# Patient Record
Sex: Male | Born: 1935
Health system: Southern US, Community
[De-identification: ages and names within clinical notes are randomized; demographics above are authoritative.]

## PROBLEM LIST (undated history)

## (undated) ENCOUNTER — Emergency Department (HOSPITAL_BASED_OUTPATIENT_CLINIC_OR_DEPARTMENT_OTHER): Admission: EM | Payer: Medicare PPO | Source: Home / Self Care

## (undated) DIAGNOSIS — F0390 Unspecified dementia without behavioral disturbance: Secondary | ICD-10-CM

## (undated) DIAGNOSIS — J189 Pneumonia, unspecified organism: Secondary | ICD-10-CM

## (undated) DIAGNOSIS — E785 Hyperlipidemia, unspecified: Secondary | ICD-10-CM

## (undated) DIAGNOSIS — G4730 Sleep apnea, unspecified: Secondary | ICD-10-CM

## (undated) DIAGNOSIS — E669 Obesity, unspecified: Secondary | ICD-10-CM

## (undated) DIAGNOSIS — I639 Cerebral infarction, unspecified: Secondary | ICD-10-CM

## (undated) DIAGNOSIS — F419 Anxiety disorder, unspecified: Secondary | ICD-10-CM

## (undated) HISTORY — PX: TRANSURETHRAL RESECTION OF PROSTATE: SHX73

## (undated) HISTORY — PX: OTHER SURGICAL HISTORY: SHX169

## (undated) HISTORY — DX: Pneumonia, unspecified organism: J18.9

## (undated) HISTORY — DX: Unspecified dementia, unspecified severity, without behavioral disturbance, psychotic disturbance, mood disturbance, and anxiety: F03.90

## (undated) HISTORY — DX: Cerebral infarction, unspecified: I63.9

## (undated) HISTORY — DX: Hyperlipidemia, unspecified: E78.5

## (undated) HISTORY — DX: Anxiety disorder, unspecified: F41.9

## (undated) HISTORY — DX: Obesity, unspecified: E66.9

## (undated) HISTORY — PX: HERNIA REPAIR: SHX51

## (undated) HISTORY — PX: REPLACEMENT TOTAL KNEE: SUR1224

---

## 1998-01-16 ENCOUNTER — Encounter: Admission: RE | Admit: 1998-01-16 | Discharge: 1998-04-16 | Payer: Self-pay | Admitting: Neurosurgery

## 1998-11-02 ENCOUNTER — Encounter: Admission: RE | Admit: 1998-11-02 | Discharge: 1999-01-31 | Payer: Self-pay | Admitting: *Deleted

## 1999-01-08 ENCOUNTER — Encounter: Payer: Self-pay | Admitting: Orthopedic Surgery

## 1999-01-15 ENCOUNTER — Inpatient Hospital Stay (HOSPITAL_COMMUNITY): Admission: RE | Admit: 1999-01-15 | Discharge: 1999-01-19 | Payer: Self-pay | Admitting: Orthopedic Surgery

## 1999-01-15 ENCOUNTER — Encounter: Payer: Self-pay | Admitting: Orthopedic Surgery

## 1999-01-21 ENCOUNTER — Emergency Department (HOSPITAL_COMMUNITY): Admission: EM | Admit: 1999-01-21 | Discharge: 1999-01-21 | Payer: Self-pay | Admitting: Emergency Medicine

## 2002-12-16 ENCOUNTER — Ambulatory Visit (HOSPITAL_COMMUNITY): Admission: RE | Admit: 2002-12-16 | Discharge: 2002-12-16 | Payer: Self-pay | Admitting: *Deleted

## 2004-11-18 ENCOUNTER — Ambulatory Visit: Payer: Self-pay | Admitting: Internal Medicine

## 2004-11-18 ENCOUNTER — Ambulatory Visit (HOSPITAL_BASED_OUTPATIENT_CLINIC_OR_DEPARTMENT_OTHER): Admission: RE | Admit: 2004-11-18 | Discharge: 2004-11-18 | Payer: Self-pay | Admitting: Family Medicine

## 2004-11-18 ENCOUNTER — Encounter: Payer: Self-pay | Admitting: Pulmonary Disease

## 2006-01-12 ENCOUNTER — Ambulatory Visit: Payer: Self-pay | Admitting: Gastroenterology

## 2006-01-22 ENCOUNTER — Ambulatory Visit: Payer: Self-pay | Admitting: Gastroenterology

## 2006-01-30 ENCOUNTER — Ambulatory Visit: Payer: Self-pay | Admitting: Gastroenterology

## 2006-07-17 ENCOUNTER — Ambulatory Visit (HOSPITAL_COMMUNITY): Admission: RE | Admit: 2006-07-17 | Discharge: 2006-07-17 | Payer: Self-pay | Admitting: Cardiology

## 2006-07-27 ENCOUNTER — Ambulatory Visit: Payer: Self-pay | Admitting: Pulmonary Disease

## 2006-08-24 ENCOUNTER — Ambulatory Visit: Payer: Self-pay | Admitting: Pulmonary Disease

## 2007-02-19 ENCOUNTER — Ambulatory Visit: Payer: Self-pay | Admitting: Pulmonary Disease

## 2008-07-19 DIAGNOSIS — G4733 Obstructive sleep apnea (adult) (pediatric): Secondary | ICD-10-CM

## 2008-07-19 DIAGNOSIS — E785 Hyperlipidemia, unspecified: Secondary | ICD-10-CM

## 2008-07-19 DIAGNOSIS — E119 Type 2 diabetes mellitus without complications: Secondary | ICD-10-CM

## 2008-07-19 HISTORY — DX: Obstructive sleep apnea (adult) (pediatric): G47.33

## 2008-07-20 ENCOUNTER — Ambulatory Visit: Payer: Self-pay | Admitting: Pulmonary Disease

## 2008-09-24 ENCOUNTER — Emergency Department (HOSPITAL_COMMUNITY): Admission: EM | Admit: 2008-09-24 | Discharge: 2008-09-24 | Payer: Self-pay | Admitting: Emergency Medicine

## 2008-09-29 ENCOUNTER — Encounter: Payer: Self-pay | Admitting: Internal Medicine

## 2009-06-20 ENCOUNTER — Encounter: Admission: RE | Admit: 2009-06-20 | Discharge: 2009-06-20 | Payer: Self-pay | Admitting: Family Medicine

## 2010-03-13 ENCOUNTER — Emergency Department (HOSPITAL_COMMUNITY): Admission: EM | Admit: 2010-03-13 | Discharge: 2010-03-13 | Payer: Self-pay | Admitting: Emergency Medicine

## 2010-12-30 LAB — CBC
HCT: 34.2 % — ABNORMAL LOW (ref 39.0–52.0)
Hemoglobin: 11.3 g/dL — ABNORMAL LOW (ref 13.0–17.0)
MCHC: 33.2 g/dL (ref 30.0–36.0)
MCV: 97.2 fL (ref 78.0–100.0)
Platelets: 199 10*3/uL (ref 150–400)
RBC: 3.51 MIL/uL — ABNORMAL LOW (ref 4.22–5.81)
RDW: 14.4 % (ref 11.5–15.5)
WBC: 7 10*3/uL (ref 4.0–10.5)

## 2010-12-30 LAB — DIFFERENTIAL
Lymphocytes Relative: 25 % (ref 12–46)
Lymphs Abs: 1.8 10*3/uL (ref 0.7–4.0)
Neutrophils Relative %: 59 % (ref 43–77)

## 2010-12-30 LAB — POCT I-STAT, CHEM 8
BUN: 21 mg/dL (ref 6–23)
Creatinine, Ser: 1.3 mg/dL (ref 0.4–1.5)
Hemoglobin: 12.2 g/dL — ABNORMAL LOW (ref 13.0–17.0)
Potassium: 4.3 mEq/L (ref 3.5–5.1)
Sodium: 137 mEq/L (ref 135–145)

## 2011-01-31 NOTE — Procedures (Signed)
NAME:  [name] [name], [name] [name] NO.:  [id number]   MEDICAL RECORD NO.:  [id number]          PATIENT TYPE:  OUT   LOCATION:  SLEEP CENTER                 FACILITY:  [facility]   PHYSICIAN:  Clinton D. Maple Hudson, M.D. DATE OF BIRTH:  1929/08/07   DATE OF STUDY:  11/18/2004                              NOCTURNAL POLYSOMNOGRAM   DATE OF STUDY:  November 18, 2004   REFERRING PHYSICIAN:  Dr. [name]   INDICATION FOR STUDY:  Hypersomnia with sleep apnea.  Epworth Sleepiness  Score 11/24, BMI 29, weight 198 pounds.   SLEEP ARCHITECTURE:  Total sleep time 313 minutes with sleep efficiency 70%.  Stage I was 17%, stage II 68%, stages III and IV 3%, REM was 13% of total  sleep time.  Sleep latency 6 minutes, REM latency 163 minutes, awake after  sleep onset 126 minutes, arousal index 16.  No medications were taken.   RESPIRATORY DATA:  Split-study protocol.  Respiratory disturbance index  (RDI) 69.1 obstructive events per hour before CPAP.  This indicates severe  obstructive sleep apnea/hypopnea syndrome.  There were 74 obstructive apneas  and 109 hypopneas before CPAP.  Events were not positional.  REM RDI 6 per  hour.  CPAP was titrated to 14 CWP, RDI 4.2 per hour.  A large SNAPP by  [employer] was used with a heated humidifier.  Technician added a  chin strap.   OXYGEN DATA:  Loud snoring with oxygen desaturation to a nadir of 81% before  CPAP.  After CPAP control oxygen saturation held 94-98% on room air.   CARDIAC DATA:  Sinus rhythm with PACs and PVCs including ventricular  bigeminy.   MOVEMENT/PARASOMNIA:  A total of 109 limb jerks were recorded of which 9  were associated with arousal or awakening for a periodic limb movement with  arousal index of 1.7 per hour which is probably not significant.   IMPRESSION/RECOMMENDATION:  1.  Severe obstructive sleep apnea/hypopnea syndrome, respiratory      disturbance index 69.1 per hour with oxygen desaturation to 81%.  2.   Successful continuous positive airway pressure titration to 14 CWP using      a large SNAPP by [employer] with a heated humidifier.  The      technician added a chin strap to control oral venting which may not be      needed at home.  3.  Frequent premature atrial contractions and premature ventricular      contractions.      CDY/MEDQ  D:  11/24/2004 08:48:35  T:  11/24/2004 21:13:26  Job:  [id number]

## 2011-01-31 NOTE — Assessment & Plan Note (Signed)
[facility]                               PULMONARY OFFICE NOTE   NAME:WEBSTERBorna, Wessinger                MRN:          [id number]  DATE:07/27/2006                            DOB:          Dec 02, 1928    HISTORY OF PRESENT ILLNESS:  The patient is a 77 year old male who I have  been asked to see for obstructive sleep apnea. The patient was first  diagnosed with severe obstructive sleep apnea in March of 2006. At that  time, he had a respiratory disturbance index of 69 events per hour with O2  desaturation as low as 81%. He was titrated to a final pressure of 14 cm but  did have trouble with oral venting. The patient states that he wore CPAP for  a short time but has not worn for several months. He states that he is  unable to handle having something on his face, and he also feels that the  CPAP aggravates his sinus congestion. The patient is using humidity but is  not using his heater that would probably help his sinus symptoms  significant. He does use nasal pillows. His current pressure is now set on  10, since he could handle the higher pressure initially. The patient  typically goes to bed at 10:00 and gets up at 7:30 to start his day. He is  only rested some of the mornings. The wife states he has very loud snoring  and a lot of pauses in his breathing during sleep. It has gotten to the  point where it greatly disturbs her sleep. During the day, the patient does  have severe sleepiness with periods of inactivity. He will fall asleep every  time he sits down according to the wife and has even fallen asleep in  church. He is unable to read or watch TV without sleeping. He has no  difficulties driving.   PAST MEDICAL HISTORY:  1. Significant for diabetes.  2. Dyslipidemia.  3. History of heart problems that the patient is unable to describe.  4. History of sleep apnea as stated above.  5. Status post total knee replacement.   CURRENT  MEDICATIONS:  1. Glucophage 1000 mg b.i.d.  2. Glucotrol 10 mg daily.  3. Actos 45 mg daily.  4. Aspirin 81 mg daily.  5. Lisinopril 40 mg daily.  6. Zocor 80 mg daily.  7. Doxazosin 4 mg daily.  8. Various vitamins.   The patient has an intolerance to Robaxin.   SOCIAL HISTORY:  The patient is married with 2 daughters. He has never  smoked.   FAMILY HISTORY:  Remarkable for mother and father having emphysema. His  mother has also had liver cancer.   REVIEW OF SYSTEMS:  As per history of present illness. Also see patient  intake form in chart.   PHYSICAL EXAMINATION:  In general, he is an overweight white male in no  acute distress. Blood pressure is 106/62, pulse 85, temperature 98.3, weight  204 pounds. O2 saturation on room air 96%.  HEENT:  Pupils are equal, round, and reactive to light and accommodation.  Extraocular movements are intact.  Nares show mild septal deviation to the  left. Oropharynx shows a mildly trimmed palate which the patient states was  done 15 years ago. He still has fairly significant elongation of the soft  palate and is missing his uvula.  NECK:  Supple without JVD or lymphadenopathy. There is no palpable  thyromegaly.  CHEST:  Totally clear.  CARDIAC:  Reveals mildly irregular rate and rhythm. No murmurs, rubs or  gallops.  ABDOMEN:  Soft and nontender with good bowel sounds.  GENITALIA EXAM, RECTAL EXAM, BREAST EXAM:  Not done and not indicated.  LOWER EXTREMITIES:  Show trace edema. Pulses were intact distally.  NEUROLOGICAL:  Alert and oriented with no obvious motor deficits.   IMPRESSION:  Obstructive sleep apnea documented by nocturnal polysomnography  in 2006. The patient has not been able to wear CPAP on a consistent basis  because of intolerance of the CPAP mask. I really think that we need to work  with him on desensitization and that he may also benefit from a trial of a  sedative hypnotic short term. I am also wondering if part of his  issue is  that he is doing a lot of mouth opening with his nasal pillows and that is  disrupting his sleep as well. Because of his history of oral venting, he  would probably do better with full-face mask. He is willing to give this a  trial. I have stressed to him again the importance that we treat his sleep  apnea based on cardiovascular help as well as quality of life.   PLAN:  1. Have discussed desensitization with him where he will wear his CPAP      mask during his waking hours for 30 minutes on 2 occasions each day      while in his family room watching TV.  2. Trial of Ambien CR 6.25 mg every night for 14 days only.  3. I have asked the patient to use the heater on his humidifier to help      him with nasal congestion.  4. Will change the patient over to a full-face mask, and the DME will go      over the various ones available to him and work on fitting.  5. Work on weight loss.  6. The patient will follow up in 4 weeks or sooner if there are problems.    ______________________________  Barbaraann Share, MD,FCCP    [facility]/MedQ  DD: 07/27/2006  DT: 07/27/2006  Job #: [id number]   cc:   Armanda Magic, M.D.  [name], M.D.

## 2011-01-31 NOTE — Assessment & Plan Note (Signed)
[facility]                             PULMONARY OFFICE NOTE   NAME:[name], [name] BELAIR                MRN:          [id number]  DATE:08/24/2006                            DOB:          June 02, 1929    SLEEP MEDICINE FOLLOWUP:   SUBJECTIVE:  [name] [name] comes in today for followup of his CPAP  treatment for his sleep apnea.  He was unable to wear the full face mask  at the last visit but did go back to the nasal pillows and with the  desensitization and the short-term use of Ambien was able to get used to  it.  He is currently wearing it approximately 8 hours a night, and this  has been verified by his wife.  She feels that he is sleeping much  better and has seen a difference in his personality and alertness during  the day.  The patient is having no complications from the CPAP at this  time.   PHYSICAL EXAMINATION:  VITAL SIGNS:  BP is 114/70, pulse 98, temperature  is 98.8, weight 215 pounds.  O2 saturation on room air is 97%.  SKIN:  There is no evidence of skin breakdown or pressure necrosis from  the CPAP mask.   IMPRESSION:  Severe obstructive sleep apnea, which is being treated with  CPAP.  The patient currently is on 10 cmH2O pressure, which is below his  need of 14 cm.  Now that he is wearing it every night and is comfortable  with it, we will go ahead and bump his CPAP pressure to 12 and then up  to 14 in 2 weeks.  The patient is agreeable with this approach.  He also  understands that he needs to work on weight loss.   PLAN:  1. Increase CPAP to 12 cm for 2 weeks and then up to the therapeutic      range of 14 cm.  2. Work on weight loss.  3. Follow up 6 months or sooner if there are problems.     Barbaraann Share, MD,FCCP  Electronically Signed    [facility]/MedQ  DD: 08/24/2006  DT: 08/24/2006  Job #: [id number]   cc:   Armanda Magic, M.D.  [name], M.D.

## 2012-03-27 ENCOUNTER — Emergency Department (HOSPITAL_COMMUNITY)
Admission: EM | Admit: 2012-03-27 | Discharge: 2012-03-28 | Disposition: A | Discharge status: Home / Self Care | Payer: Medicare Other | Attending: Emergency Medicine | Admitting: Emergency Medicine

## 2012-03-27 DIAGNOSIS — Z79899 Other long term (current) drug therapy: Secondary | ICD-10-CM | POA: Insufficient documentation

## 2012-03-27 DIAGNOSIS — T38801A Poisoning by unspecified hormones and synthetic substitutes, accidental (unintentional), initial encounter: Secondary | ICD-10-CM | POA: Insufficient documentation

## 2012-03-27 DIAGNOSIS — E119 Type 2 diabetes mellitus without complications: Secondary | ICD-10-CM | POA: Insufficient documentation

## 2012-03-27 DIAGNOSIS — T50901A Poisoning by unspecified drugs, medicaments and biological substances, accidental (unintentional), initial encounter: Secondary | ICD-10-CM

## 2012-03-27 DIAGNOSIS — T383X1A Poisoning by insulin and oral hypoglycemic [antidiabetic] drugs, accidental (unintentional), initial encounter: Secondary | ICD-10-CM | POA: Insufficient documentation

## 2012-03-27 DIAGNOSIS — Z7982 Long term (current) use of aspirin: Secondary | ICD-10-CM | POA: Insufficient documentation

## 2012-03-27 DIAGNOSIS — G4730 Sleep apnea, unspecified: Secondary | ICD-10-CM | POA: Insufficient documentation

## 2012-03-27 HISTORY — DX: Sleep apnea, unspecified: G47.30

## 2012-03-27 LAB — GLUCOSE, CAPILLARY: Glucose-Capillary: 201 mg/dL — ABNORMAL HIGH (ref 70–99)

## 2012-03-27 NOTE — ED Notes (Signed)
Pt reports took Lantus 22 units at 7pm and then again at 10pm.  Pt CBG 172 at 10 pm.  [t reports took 12 oz of juice at that time

## 2012-03-28 ENCOUNTER — Encounter (HOSPITAL_COMMUNITY): Payer: Self-pay | Admitting: *Deleted

## 2012-03-28 LAB — BASIC METABOLIC PANEL
Calcium: 9.1 mg/dL (ref 8.4–10.5)
Chloride: 101 mEq/L (ref 96–112)
Creatinine, Ser: 1.01 mg/dL (ref 0.50–1.35)
GFR calc Af Amer: 78 mL/min — ABNORMAL LOW (ref >90)
GFR calc non Af Amer: 68 mL/min — ABNORMAL LOW (ref >90)

## 2012-03-28 LAB — CBC WITH DIFFERENTIAL/PLATELET
Basophils Absolute: 0 10*3/uL (ref 0.0–0.1)
Basophils Relative: 0 % (ref 0–1)
Eosinophils Absolute: 0.2 10*3/uL (ref 0.0–0.7)
Eosinophils Relative: 2 % (ref 0–5)
HCT: 35.4 % — ABNORMAL LOW (ref 39.0–52.0)
MCH: 32.7 pg (ref 26.0–34.0)
MCHC: 33.6 g/dL (ref 30.0–36.0)
Monocytes Absolute: 0.7 10*3/uL (ref 0.1–1.0)
Neutro Abs: 3.2 10*3/uL (ref 1.7–7.7)
RDW: 14.7 % (ref 11.5–15.5)

## 2012-03-28 LAB — GLUCOSE, CAPILLARY: Glucose-Capillary: 118 mg/dL — ABNORMAL HIGH (ref 70–99)

## 2012-03-28 NOTE — ED Notes (Signed)
Pt accidentally took lantus twice once at 730 pm and then again at 1030 pm,   He immediately drank a large glass of juice,  Blood sugar stable currently,  Wife is crying afraid something will happen when it peaks in 12 hours,  Here for observation

## 2012-03-28 NOTE — ED Notes (Signed)
Orange juice given

## 2012-03-28 NOTE — ED Notes (Signed)
Patient given discharge instructions, information, prescriptions, and diet order. Patient states that they adequately understand discharge information given and to return to ED if symptoms return or worsen.     

## 2012-03-28 NOTE — ED Provider Notes (Addendum)
History     CSN: [id number]  Arrival date & time 03/27/12  2253   First MD Initiated Contact with Patient 03/28/12 0153      Chief Complaint  Patient presents with  . Drug Overdose    accidentally took Lantus twice     Patient is a 78 y.o. male presenting with Overdose. The history is provided by the patient.  Drug Overdose This is a new problem. The problem occurs constantly. The problem has not changed since onset.Associated symptoms include abdominal pain. Pertinent negatives include no chest pain and no shortness of breath. Nothing aggravates the symptoms. Nothing relieves the symptoms.  pt gave himself and accidental dose of lantus He took his normal dose of lantus at 7pm (22 units) He then took an extra dose at 10:30pm (22 units) accidentally No other meds taken He takes metformin as well He feels well - denies cp/sob/weakness/abdominal pain.    Past Medical History  Diagnosis Date  . Diabetes mellitus   . Sleep apnea     uses CPAP at night to sleep    Past Surgical History  Procedure Date  . Hernia repair   . Replacement total knee     History reviewed. No pertinent family history.  History  Substance Use Topics  . Smoking status: Not on file  . Smokeless tobacco: Not on file  . Alcohol Use: No      Review of Systems  Constitutional: Negative for fever.  Respiratory: Negative for shortness of breath.   Cardiovascular: Negative for chest pain.  Gastrointestinal: Positive for abdominal pain. Negative for vomiting.  All other systems reviewed and are negative.    Allergies  Donepezil and Methocarbamol  Home Medications   Current Outpatient Rx  Name Route Sig Dispense Refill  . ASPIRIN EC 81 MG PO TBEC Oral Take 81 mg by mouth daily.    Marland Kitchen VITAMIN D-3 PO Oral Take 1 tablet by mouth daily.    Marland Kitchen COENZYME Q10 30 MG PO CAPS Oral Take 30 mg by mouth daily.    Marland Kitchen DOXAZOSIN MESYLATE 4 MG PO TABS Oral Take 4 mg by mouth daily.    Marland Kitchen FOLIC ACID 800 MCG PO  TABS Oral Take 800 mcg by mouth daily.    . INSULIN GLARGINE 100 UNIT/ML [place] SOLN Subcutaneous Inject 22 Units into the skin every evening.    Marland Kitchen METFORMIN HCL 1000 MG PO TABS Oral Take 1,000 mg by mouth 2 (two) times daily with a meal.    . METHOTREXATE SODIUM 2.5 MG PO TABS Oral Take 15 mg by mouth once a week. Saturday    . ADULT MULTIVITAMIN W/MINERALS CH Oral Take 1 tablet by mouth daily.    Marland Kitchen SIMVASTATIN 80 MG PO TABS Oral Take 80 mg by mouth at bedtime.      BP 123/56  Pulse 82  Temp 99.3 F (37.4 C) (Oral)  Resp 18  Ht 5' 9.5" (1.765 m)  Wt 209 lb 9 oz (95.057 kg)  BMI 30.50 kg/m2  SpO2 98%  Physical Exam CONSTITUTIONAL: Well developed/well nourished HEAD AND FACE: Normocephalic/atraumatic EYES: EOMI/PERRL ENMT: Mucous membranes moist NECK: supple no meningeal signs SPINE:entire spine nontender CV: S1/S2 noted, no murmurs/rubs/gallops noted LUNGS: Lungs are clear to auscultation bilaterally, no apparent distress ABDOMEN: soft, nontender, no rebound or guarding GU:no cva tenderness NEURO: Pt is awake/alert, moves all extremitiesx4 EXTREMITIES: pulses normal, full ROM SKIN: warm, color normal PSYCH: no abnormalities of mood noted  ED Course  Procedures  Labs Reviewed  GLUCOSE, CAPILLARY - Abnormal; Notable for the following:    Glucose-Capillary 201 (*)     All other components within normal limits  GLUCOSE, CAPILLARY - Abnormal; Notable for the following:    Glucose-Capillary 146 (*)     All other components within normal limits  CBC WITH DIFFERENTIAL  BASIC METABOLIC PANEL   Pt well appearing Accidental use of lantus Will monitor until 0403am and reassess - that will be 6 hrs after last dose He is on metformin but no other diabetic meds  Pt observed for several hours, no drop in glucose, requesting d/c home, advised to hold metformin and if no drop in glucose throughout day can restart lantus tonight  MDM  Nursing notes including past medical history and  social history reviewed and considered in documentation labs/vitals reviewed and considered         [name], MD 03/28/12 5621  [name], MD 03/28/12 [phone]

## 2012-03-28 NOTE — ED Notes (Signed)
Patient sts that he is in no pain or distress at this time. Scared because he took a double dose of his lantus and would like to be evaluated by MD.

## 2012-04-29 ENCOUNTER — Other Ambulatory Visit: Payer: Self-pay | Admitting: Neurology

## 2012-04-29 DIAGNOSIS — R413 Other amnesia: Secondary | ICD-10-CM

## 2012-04-30 ENCOUNTER — Ambulatory Visit
Admission: RE | Admit: 2012-04-30 | Discharge: 2012-04-30 | Disposition: A | Discharge status: Home / Self Care | Payer: Medicare Other | Source: Ambulatory Visit | Attending: Neurology | Admitting: Neurology

## 2012-04-30 DIAGNOSIS — R413 Other amnesia: Secondary | ICD-10-CM

## 2013-11-11 ENCOUNTER — Telehealth: Payer: Self-pay | Admitting: Neurology

## 2013-11-11 NOTE — Telephone Encounter (Signed)
Pt's wife [name] [name] wants to see if pt can get before Aug. Pt is having short term memory loss to some degree. Pt won't be available next week but after that is fine.  Please call [name] [name] to schedule an app. this is also for his yearly f/u.

## 2013-11-14 NOTE — Telephone Encounter (Signed)
Patient has an appt on 12/09/13 with Larita FifeLynn, NP. I advised the patient's wife Kathie RhodesBetty that if the patient has any other problems, questions or concerns to call the office. Patient's wife verbalized understanding.

## 2013-12-09 ENCOUNTER — Encounter (INDEPENDENT_AMBULATORY_CARE_PROVIDER_SITE_OTHER): Payer: Self-pay

## 2013-12-09 ENCOUNTER — Telehealth: Payer: Self-pay | Admitting: Nurse Practitioner

## 2013-12-09 ENCOUNTER — Ambulatory Visit (INDEPENDENT_AMBULATORY_CARE_PROVIDER_SITE_OTHER): Payer: Medicare Other | Admitting: Nurse Practitioner

## 2013-12-09 ENCOUNTER — Encounter: Payer: Self-pay | Admitting: Nurse Practitioner

## 2013-12-09 VITALS — BP 117/68 | HR 74 | Wt 205.0 lb

## 2013-12-09 DIAGNOSIS — R413 Other amnesia: Secondary | ICD-10-CM

## 2013-12-09 NOTE — Progress Notes (Signed)
PATIENT: [name] [name] DOB: [birth year]  REASON FOR VISIT: follow up for memory loss HISTORY FROM: patient  HISTORY OF PRESENT ILLNESS: UPDATE 12/09/13 (LL):  [name] [name] comes in for follow up after 1 1/2 years.  He is accompanied by a daughter and wife. He had lip, tongue and throat swelling with Namenda and Aricept.  His short term memory is impaired but his attention/calculation and visuo-spatial perception is not affected.  04/27/12 PRIOR HPI (CD):  I have seen" [name]" Eliakim [name] many years ago for memory loss and he is now here , I believe over  5 years later. His wife is my patient as well.   MMSE and MOCA were planned for today , and medication therapy  to be initiated or revised.  He has been known to take his medication wrong , forgets or doubles it at times, and ended up in hospital . His wife reported  he lost 3 mobile phones in  a couple of month.  He is still active , they  takes  week end trips together and her does much of the shopping.   His wife  indicates that he is making excuses for  memory lapses,  and she is concerned. He took a double dose of Lantus  insulin and she remarked he  is short tempered when corrected or questioned, had some improvement on an SSRI. He had to discontinue  Aricept due to side effects and would like to see what else is meanwhile on the market.  Review of Systems  Out of a complete 14 system review, the patient complains of only the following symptoms, and all other reviewed systems are negative.  Eyes: Loss of vision in R, eye discharge in R, light sensitivity  Hematology/Lymphatic: Easy bruising   Neurological: Memory loss   tremor   Sleep: Snoring   Restless legs, apnea   Genitourinary: Impotence (Men)  Frequency of urination  Allergy/Immunology: Allergies    Psychiatric: Confusion , Anxiety  Comments: loss of vision in R eye  ALLERGIES: Allergies  Allergen Reactions  . Donepezil Swelling  . Methocarbamol Swelling  . [name] Hcl]     HOME MEDICATIONS: Outpatient Prescriptions Prior to Visit  Medication Sig Dispense Refill  . aspirin EC 81 MG tablet Take 81 mg by mouth daily.      . Cholecalciferol (VITAMIN D-3 PO) Take 1 tablet by mouth daily.      Marland Kitchen co-enzyme Q-10 30 MG capsule Take 30 mg by mouth daily.      Marland Kitchen doxazosin (CARDURA) 4 MG tablet Take 4 mg by mouth daily.      . folic acid (FOLVITE) 800 MCG tablet Take 800 mcg by mouth daily.      . insulin glargine (LANTUS SOLOSTAR) 100 UNIT/ML injection Inject 22 Units into the skin every evening.      . metFORMIN (GLUCOPHAGE) 1000 MG tablet Take 1,000 mg by mouth 2 (two) times daily with a meal.      . methotrexate 2.5 MG tablet Take 15 mg by mouth once a week. Saturday      . Multiple Vitamin (MULTIVITAMIN WITH MINERALS) TABS Take 1 tablet by mouth daily.      . simvastatin (ZOCOR) 80 MG tablet Take 80 mg by mouth at bedtime.       No facility-administered medications prior to visit.     PHYSICAL EXAM  Filed Vitals:   12/09/13 1004  BP: 117/68  Pulse: 74  Weight: 205  lb (92.987 kg)   Body mass index is 29.85 kg/(m^2).  Physical Exam  General: This patient is alert and cooperative at the time of the examination. Head: normocephlaic  Ears, Nose and Throat: mallompati 3  Neck: supple , thick  Respiratory: LCTA Cardiovascular: RRR Skin: No significant peripheral edema is noted. Trunk: obese   Neurologic Exam  Mental Status: MMSE today is  26 out of 30, loss of recall, day and date, and AFT is 12 points. Last visit in 2013 was 25/30. Patient is hearing and vision impaired.  Cranial Nerves:  Full extraoccular movements.  Facial symmetry is present.  Speech is normal, no aphasia or dysarthria is noted. Visual fields are full.  Tongue and uvula midline.  Motor: Patient has good strength in all 4 extremities. Sensory: intact to pin prick, light touch Coordination: Patient has good finger-to-nose bilaterally, and strong grip Gait and  Station: Patient has a normal gait, not wide based and not shuffling, normal arm swing.   Romberg is negative.  No drift is seen.   Reflexes: Deep tendon reflexes are symmetric.  04/28/2012  TSH                       1.250 uIU/mL                 Homocyst(e)ine, Plasma  [H]  18.5            RPR                       Non Reactive                 Vitamin B12               672 pg/mL                          ASSESSMENT AND PLAN 80 y.o. year old male  has a past medical history of Diabetes mellitus; Sleep apnea; and Memory loss. here for follow up of memory loss.  MMSE and MOCA testing suggestive of mild cognitive impairment in this 80 year old patient. MMSE is 26/30, with no visuo-spatial or attention problems.  2 points lost for delayed recall, 2 for day of the week and date.  Low depression score 2 points,  ADL independent. Patient has less physical activity - advised low carb diet,  Exercise by walking for pleasure. We talked about other treatments for memory loss, he is allergic to Aricept and Namenda.  Discussed Vayacog supplement, but it warns for shellfish allergy-- and family states he has had several reactions to eating fish and shellfish.   [name] FearLYNN E. Arelys Glassco, MSN, NP-C 12/09/2013, 10:22 AM [facility] [address], [address] [place], [place] [address] [phone]  Note: This document was prepared with digital dictation and possible smart phrase technology. Any transcriptional errors that result from this process are unintentional.

## 2013-12-09 NOTE — Patient Instructions (Signed)
I will contact the Pharmaceutical Representative and call you either today or Monday concerning the Supplement we talked about at our visit.  Follow up in 1 year, sooner as needed.

## 2013-12-09 NOTE — Telephone Encounter (Signed)
Family had questions about Vayacog supplement since [name] is allergic to Aricept and Namenda.  He also has had allergic reaction to shellfish.    I emailed Rodman KeyJeff Gilbert, Medical specialist with [employer] to inquire on the ingredients of the supplement.  He replied that Drue DunVayacog is manufactured using herring, sprout, blue whiting, and anchovy but is manufactured in the same facility that makes [employer]ayarin which uses krill.  I called Mrs. Quadros and told her the information.  I do not feel comfortable recommending he try this supplement due to the allergy risk.  She was in agreement.

## 2014-07-04 ENCOUNTER — Ambulatory Visit (INDEPENDENT_AMBULATORY_CARE_PROVIDER_SITE_OTHER): Payer: Medicare Other | Admitting: Neurology

## 2014-07-04 ENCOUNTER — Encounter: Payer: Self-pay | Admitting: Neurology

## 2014-07-04 ENCOUNTER — Encounter (INDEPENDENT_AMBULATORY_CARE_PROVIDER_SITE_OTHER): Payer: Self-pay

## 2014-07-04 VITALS — BP 122/76 | HR 78 | Temp 98.8°F | Resp 14 | Ht 67.5 in | Wt 208.0 lb

## 2014-07-04 DIAGNOSIS — G3184 Mild cognitive impairment, so stated: Secondary | ICD-10-CM

## 2014-07-04 NOTE — Progress Notes (Signed)
PATIENT: [name] [name] DOB: 03-Apr-1929  REASON FOR VISIT: follow up for memory loss HISTORY FROM: patient  HISTORY OF PRESENT ILLNESS:  Mr. [name] [name] comes in for follow up after  1/2 years.  He is accompanied by his  wife. He had lip, tongue and throat swelling with Namenda and Aricept.   His short term memory is impaired but his attention/calculation and visuo-spatial perception is not affected. He scored today 27 out of 30 possible points on MMSE. Since retirement he ha lost more often track of the date and day of the week.    04/27/12 PRIOR HPI (CD):  I have seen" [name]" Cecile HearingCharles  [name] many years ago for memory loss and he is now here , I believe over  5 years later. His wife is my patient as well.   MMSE and MOCA were planned for today , and medication therapy  to be initiated or revised.  He has been known to take his medication wrong , forgets or doubles it at times, and ended up in hospital . His wife reported  he lost 3 mobile phones in  a couple of month.  He is still active , they  takes  week end trips together and her does much of the shopping.   His wife  indicates that he is making excuses for  memory lapses,  and she is concerned. He took a double dose of Lantus insulin and she remarked he  is short tempered when corrected or questioned, had some improvement on an SSRI. He had to discontinue  Aricept due to side effects and would like to see what else is meanwhile on the market. He physicaly doing well. He has some RLS, he has leg jumping and jerking, myoclonus.   Review of Systems  Out of a complete 14 system review, the patient complains of only the following symptoms, and all other reviewed systems are negative.  Eyes: Loss of vision in R, eye discharge in R, light sensitivity  Hematology/Lymphatic: Easy bruising   Neurological: Memory loss/ tremor    Sleep: Snoring   Restless legs, apnea   Genitourinary: Impotence (Men)  Frequency of urination  Allergy/Immunology:  Allergies     Psychiatric: Confusion , Anxiety  Comments: loss of vision in R eye  ALLERGIES: Allergies  Allergen Reactions  . Donepezil Swelling  . Namenda [Memantine Hcl] Swelling  . Lisinopril Swelling  . Methocarbamol Swelling    HOME MEDICATIONS: Outpatient Prescriptions Prior to Visit  Medication Sig Dispense Refill  . Cholecalciferol (VITAMIN D-3 PO) Take 1 tablet by mouth daily.      Marland Kitchen. co-enzyme Q-10 30 MG capsule Take 30 mg by mouth daily.      Marland Kitchen. doxazosin (CARDURA) 4 MG tablet Take 4 mg by mouth daily.      . folic acid (FOLVITE) 800 MCG tablet Take 800 mcg by mouth daily.      . insulin glargine (LANTUS SOLOSTAR) 100 UNIT/ML injection Inject 22 Units into the skin every evening.      . metFORMIN (GLUCOPHAGE) 1000 MG tablet Take 1,000 mg by mouth 2 (two) times daily with a meal.      . methotrexate 2.5 MG tablet Take 15 mg by mouth once a week. Saturday      . Multiple Vitamin (MULTIVITAMIN WITH MINERALS) TABS Take 1 tablet by mouth daily.      Marland Kitchen. omeprazole (PRILOSEC) 10 MG capsule Take 10 mg by mouth daily.      . simvastatin (  ZOCOR) 80 MG tablet Take 80 mg by mouth at bedtime.      Marland Kitchen. aspirin EC 81 MG tablet Take 81 mg by mouth daily.       No facility-administered medications prior to visit.     PHYSICAL EXAM  Filed Vitals:   07/04/14 1534  BP: 122/76  Pulse: 78  Temp: 98.8 F (37.1 C)  TempSrc: Oral  Resp: 14  Height: 5' 7.5" (1.715 m)  Weight: 208 lb (94.348 kg)   Body mass index is 32.08 kg/(m^2).  Physical Exam  General: This patient is alert and cooperative at the time of the examination. Head: normocephlaic  Ears, Nose and Throat: mallompati 3  Neck: supple , thick  Respiratory: LCTA Cardiovascular: RRR Skin: No significant peripheral edema is noted. Trunk: obese ,  Myoclonic twitches at night.  Neurologic Exam  Mental Status: MMSE today is  27 out of 30, loss of recall, day and date, and AFT is 19 points. Last visit in  2014 was 26 out of  30 and in 2013 was 25/30. Patient is hearing and vision impaired.  Cranial Nerves:  Full extraoccular movements.  Facial symmetry is present.  Speech is normal, no aphasia or dysarthria is noted. Visual fields are full.  Tongue and uvula midline.  Motor: Patient has good strength in all 4 extremities. Sensory: intact to pin prick, light touch Coordination: Patient has good finger-to-nose bilaterally, and strong grip Gait and Station: Patient has a normal gait, not wide based and not shuffling, normal arm swing.  Romberg is negative.  No drift is seen.   Reflexes: Deep tendon reflexes are symmetric.  04/28/2012  TSH                       1.250 uIU/mL                 Homocyst(e)ine, Plasma  [H]  18.5            RPR                       Non Reactive                 Vitamin B12               672 pg/mL                          ASSESSMENT AND PLAN 80 y.o. year old male here for follow up of memory loss.  MMSE and MOCA testing suggestive of mild cognitive impairment in this 80 year old patient. MMSE is 27/30, with no visuo-spatial or attention problems.   2 points lost for delayed recall, 1 for day of the week and date.   Low  Geriatric depression score 2 points,  ADL independent. Patient has less physical activity - advised low carb diet,   Exercise by walking for pleasure. He loves game shows on TV but he forgets spoke information quickly.  We talked about other treatments for memory loss, he is allergic to Aricept and Namenda.  Fish Oil recommended.     Rv in 6 month with NP alternating with me.   [name] Mcdowell, MD   07/04/2014, 3:56 PM [facility] [address], [address] [place], [place] [address] [phone]

## 2014-09-28 ENCOUNTER — Ambulatory Visit (INDEPENDENT_AMBULATORY_CARE_PROVIDER_SITE_OTHER): Payer: Medicare Other | Admitting: Podiatry

## 2014-09-28 ENCOUNTER — Encounter: Payer: Self-pay | Admitting: Podiatry

## 2014-09-28 VITALS — BP 143/66 | HR 73 | Resp 16

## 2014-09-28 DIAGNOSIS — B351 Tinea unguium: Secondary | ICD-10-CM

## 2014-09-28 DIAGNOSIS — M2012 Hallux valgus (acquired), left foot: Secondary | ICD-10-CM

## 2014-09-28 DIAGNOSIS — M21612 Bunion of left foot: Secondary | ICD-10-CM

## 2014-09-28 NOTE — Progress Notes (Signed)
   Subjective:    Patient ID: [name] [name], male    DOB: November 04, 1928, 81 y.o.   MRN: [id number]  HPI Comments: "I have this calcium and fungus on the nails"  Patient c/o thick, discolored nails bilateral for several years. States that he can't cut them anymore.     Review of Systems  HENT: Positive for hearing loss, sinus pressure and sneezing.   Eyes: Positive for itching and visual disturbance.  Skin: Positive for rash.       Change in nails   Hematological: Bruises/bleeds easily.  Psychiatric/Behavioral: Positive for confusion.  All other systems reviewed and are negative.      Objective:   Physical Exam        Assessment & Plan:

## 2014-09-28 NOTE — Progress Notes (Signed)
Subjective:     Patient ID: [name] [name], male   DOB: [birth year], 81 y.o.   MRN: [id number]  HPI patient presents with thick nailbeds 1 through 5 of both feet and also on the left foot structural deformity around the first metatarsal states that this is been going on for a while and nails are not painful but he cannot cut them   Review of Systems  All other systems reviewed and are negative.      Objective:   Physical Exam  Constitutional: He is oriented to person, place, and time.  Cardiovascular: Intact distal pulses.   Musculoskeletal: Normal range of motion.  Neurological: He is oriented to person, place, and time.  Skin: Skin is warm and dry.  Nursing note and vitals reviewed.  neurovascular status is found to be diminished but intact with diminished range of motion subtalar midtarsal joint and this condition bilateral. 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

## 2014-12-13 ENCOUNTER — Ambulatory Visit: Payer: Medicare Other | Admitting: Neurology

## 2015-01-01 ENCOUNTER — Ambulatory Visit: Payer: Medicare Other

## 2015-01-01 ENCOUNTER — Ambulatory Visit (INDEPENDENT_AMBULATORY_CARE_PROVIDER_SITE_OTHER): Payer: Medicare Other | Admitting: Podiatry

## 2015-01-01 DIAGNOSIS — R52 Pain, unspecified: Secondary | ICD-10-CM | POA: Diagnosis not present

## 2015-01-01 DIAGNOSIS — B351 Tinea unguium: Secondary | ICD-10-CM

## 2015-01-01 NOTE — Progress Notes (Signed)
Subjective:     Patient ID: [name] [name], male   DOB: Aug 31, 1929, 81 y.o.   MRN: [id number]  HPI patient presents with thick nailbeds 1 through 5 of both feet and also on the left foot structural deformity around the first metatarsal states that this is been going on for a while and nails are not painful but he cannot cut them   Review of Systems  All other systems reviewed and are negative.      Objective:   Physical Exam  Constitutional: He is oriented to person, place, and time.  Cardiovascular: Intact distal pulses.   Musculoskeletal: Normal range of motion.  Neurological: He is oriented to person, place, and time.  Skin: Skin is warm and dry.  Nursing note and vitals reviewed.  neurovascular status is found to be diminished but intact with diminished range of motion subtalar midtarsal joint and this condition bilateral. 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

## 2015-01-02 NOTE — Progress Notes (Signed)
Subjective:     Patient ID: [name] [name], male   DOB: 07-26-1929, 81 y.o.   MRN: [id number]  HPI patient presents with thick yellow brittle nailbeds 1-5 both feet the become painful and make it hard for him to cut. Patient did do well the last time with trimming   Review of Systems     Objective:   Physical Exam Neurovascular status intact with thick yellow brittle nailbeds 1-5 both feet that are painful    Assessment:     Mycotic nail infections with pain 1-5 both feet    Plan:     Debris painful nailbeds 1-5 both feet with no iatrogenic bleeding noted

## 2015-01-03 ENCOUNTER — Ambulatory Visit: Payer: Self-pay | Admitting: Nurse Practitioner

## 2015-01-03 ENCOUNTER — Ambulatory Visit: Payer: Self-pay | Admitting: Adult Health

## 2015-01-03 ENCOUNTER — Ambulatory Visit: Payer: Medicare Other | Admitting: Neurology

## 2015-01-05 ENCOUNTER — Ambulatory Visit: Payer: Medicare Other

## 2015-02-13 ENCOUNTER — Encounter: Payer: Self-pay | Admitting: Nurse Practitioner

## 2015-02-13 ENCOUNTER — Ambulatory Visit (INDEPENDENT_AMBULATORY_CARE_PROVIDER_SITE_OTHER): Payer: Medicare Other | Admitting: Nurse Practitioner

## 2015-02-13 VITALS — BP 117/79 | HR 63 | Ht 69.5 in | Wt 211.0 lb

## 2015-02-13 DIAGNOSIS — R413 Other amnesia: Secondary | ICD-10-CM | POA: Diagnosis not present

## 2015-02-13 HISTORY — DX: Other amnesia: R41.3

## 2015-02-13 NOTE — Progress Notes (Signed)
I agree with the assessment and plan as directed by NP .The patient is known to me .   Travers Goodley, MD  

## 2015-02-13 NOTE — Progress Notes (Signed)
[facility]  PATIENT: [name] [name] DOB: Jan 28, 1929   REASON FOR VISIT: Follow-up for memory loss HISTORY FROM: Patient and wife    HISTORY OF PRESENT ILLNESS:[name] [name], 81 year old male returns for follow-up. He was last seen in this office 07/04/2014 by Dr. Vickey Huger. He follows up for mild cognitive impairment. He has had side effects to Aricept and Namenda. He was asked to take fish oil  at his last visit however he is not doing that. He continues to get some exercise by walking he continues to be independent, activities of daily living. He continues to drive car without getting lost. His most recent hemoglobin A1c was 7. He has no new neurologic complaints. He returns for reevaluation  07/04/14 (CD)[name] [name] comes in for follow up after 1/2 years. He is accompanied by his wife. He had lip, tongue and throat swelling with Namenda and Aricept.  His short term memory is impaired but his attention/calculation and visuo-spatial perception is not affected. He scored today 27 out of 30 possible points on MMSE. Since retirement he ha lost more often track of the date and day of the week.    04/27/12 PRIOR HPI (CD): I have seen" [name]" [name] many years ago for memory loss and he is now here , I believe over 5 years later. His wife is my patient as well.  MMSE and MOCA were planned for today , and medication therapy to be initiated or revised. He has been known to take his medication wrong , forgets or doubles it at times, and ended up in hospital . His wife reported he lost 3 mobile phones in a couple of month. He is still active , they takes week end trips together and her does much of the shopping. His wife indicates that he is making excuses for memory lapses, and she is concerned. He took a double dose of Lantus insulin and she remarked he is short tempered when corrected or questioned, had some improvement on an SSRI. He had to  discontinue Aricept due to side effects and would like to see what else is meanwhile on the market. He physicaly doing well. He has some RLS, he has leg jumping and jerking, myoclonus.    REVIEW OF SYSTEMS: Full 14 system review of systems performed and notable only for those listed, all others are neg:  Constitutional: neg  Cardiovascular: neg Ear/Nose/Throat: neg  Skin: neg Eyes: neg Respiratory: Shortness of breath Gastroitestinal: neg  Hematology/Lymphatic: neg  Endocrine: neg Musculoskeletal: Joint pain Allergy/Immunology: neg Neurological: Memory loss Psychiatric: Anxiety Sleep : Restless legs ALLERGIES: Allergies  Allergen Reactions  . Donepezil Swelling  . Namenda [Memantine Hcl] Swelling  . Robaxin [Methocarbamol] Shortness Of Breath and Swelling  . Lisinopril Swelling    HOME MEDICATIONS: Outpatient Prescriptions Prior to Visit  Medication Sig Dispense Refill  . Cholecalciferol (VITAMIN D-3 PO) Take 1 tablet by mouth daily.    . citalopram (CELEXA) 10 MG tablet 10 mg daily.     Marland Kitchen co-enzyme Q-10 30 MG capsule Take 30 mg by mouth daily.    Marland Kitchen doxazosin (CARDURA) 4 MG tablet Take 4 mg by mouth daily.    . folic acid (FOLVITE) 800 MCG tablet Take 800 mcg by mouth daily.    . insulin glargine (LANTUS SOLOSTAR) 100 UNIT/ML injection Inject 22 Units into the skin every evening.    . loratadine-pseudoephedrine (CLARITIN-D 24-HOUR) 10-240 MG per 24 hr tablet Take 1 tablet by mouth daily as needed for allergies.    Marland Kitchen  metFORMIN (GLUCOPHAGE) 1000 MG tablet Take 1,000 mg by mouth 2 (two) times daily with a meal.    . methotrexate 2.5 MG tablet Take 15 mg by mouth once a week. Saturday    . Multiple Vitamin (MULTIVITAMIN WITH MINERALS) TABS Take 1 tablet by mouth daily.    Marland Kitchen omeprazole (PRILOSEC) 10 MG capsule Take 10 mg by mouth daily.    . simvastatin (ZOCOR) 80 MG tablet Take 80 mg by mouth at bedtime.     No facility-administered medications prior to visit.    PAST  MEDICAL HISTORY: Past Medical History  Diagnosis Date  . Diabetes mellitus   . Sleep apnea     uses CPAP at night to sleep  . Memory loss     PAST SURGICAL HISTORY: Past Surgical History  Procedure Laterality Date  . Hernia repair    . Replacement total knee      FAMILY HISTORY: Family History  Problem Relation Age of Onset  . Liver disease Mother   . Cancer - Lung Father     SOCIAL HISTORY: History   Social History  . Marital Status: Married    Spouse Name: betty  . Number of Children: 2  . Years of Education: 12th   Occupational History  . retired    Social History Main Topics  . Smoking status: Never Smoker   . Smokeless tobacco: Not on file  . Alcohol Use: No  . Drug Use: No  . Sexual Activity: Not on file   Other Topics Concern  . Not on file   Social History Narrative   Patient lives at home and drinks caffinated  Drinks daily. Married, 2 kids.  Caffeine 2-3 cups daily.    Hs Graduate.  Retired.       PHYSICAL EXAM  Filed Vitals:   02/13/15 1048  BP: 117/79  Pulse: 63  Height: 5' 9.5" (1.765 m)  Weight: 211 lb (95.709 kg)   Body mass index is 30.72 kg/(m^2).  Generalized: Well developed, in no acute distress  Head: normocephalic and atraumatic,. Oropharynx benign  Neck: Supple,  Cardiac: Regular rate rhythm,   Musculoskeletal: No deformity   Neurological examination   Mentation: Alert oriented to time, place, history taking. Attention span and concentration appropriate. Recent and remote memory intact.  Follows all commands speech and language fluent. MMSE 27/30. AFT 13. No change from  previous score  Cranial nerve II-XII: Pupils were equal round reactive to light extraocular movements were full, visual field were full on confrontational test. Facial sensation and strength were normal. hearing was intact to finger rubbing bilaterally. Uvula tongue midline. head turning and shoulder shrug were normal and symmetric.Tongue protrusion into  cheek strength was normal. Motor: normal bulk and tone, full strength in the BUE, BLE, fine finger movements normal, no pronator drift. No focal weakness Sensory: normal and symmetric to light touch, pinprick, and  Vibration, Coordination: finger-nose-finger, heel-to-shin bilaterally, no dysmetria Reflexes: Symmetric upper and lower ,plantar responses were flexor bilaterally. Gait and Station: Rising up from seated position without assistance, normal stance,  moderate stride, good arm swing, smooth turning, able to perform tiptoe, and heel walking without difficulty. Tandem gait is steady. No assistive device  DIAGNOSTIC DATA (LABS, IMAGING, TESTING)   ASSESSMENT AND PLAN  81 y.o. year old male  has a past medical history of  Memory loss, here to follow-up. MMSE score 27 out of 30. He has had side effects to Aricept and Namenda  Memory score is stable, same  as 07/04/2014 Continue exercise by walking for overall health and well-being Continue to recommend fish oil Follow-up in 6 months with Dr. Sheppard Evensohmeier Nancy Carolyn Martin, [facility] HospitalGNP, [facility], APRN  [facility] [address], [address] [place], [place] [address] [phone]

## 2015-02-13 NOTE — Patient Instructions (Signed)
Memory score is stable, same as 07/04/2014 Follow-up in 6 months with Dr. Vickey Hugerohmeier

## 2015-04-23 ENCOUNTER — Ambulatory Visit: Payer: Medicare Other

## 2015-04-24 ENCOUNTER — Encounter: Payer: Self-pay | Admitting: Podiatry

## 2015-04-24 ENCOUNTER — Ambulatory Visit (INDEPENDENT_AMBULATORY_CARE_PROVIDER_SITE_OTHER): Payer: Medicare Other | Admitting: Podiatry

## 2015-04-24 DIAGNOSIS — B351 Tinea unguium: Secondary | ICD-10-CM

## 2015-04-24 DIAGNOSIS — M79676 Pain in unspecified toe(s): Secondary | ICD-10-CM

## 2015-04-24 NOTE — Progress Notes (Signed)
Patient ID: [name] [name], male   DOB: 1929/06/21, 81 y.o.   MRN: [id number]  This patient presents for a scheduled visit today complaining of painful toenails and is requesting toenail debridement  Objective: Patient appears responsive with his wife and treatment room today The toenails are elongated, brittle, discolored, incurvated and tender to direct palpation 6-10  Assessment: Symptomatic onychomycoses 6-10 Diabetic  Plan: Debridement of toenails 10 mechanically and electrically without a bleeding  Reappoint 3 months

## 2015-04-24 NOTE — Patient Instructions (Signed)
Diabetes and Foot Care Diabetes may cause you to have problems because of poor blood supply (circulation) to your feet and legs. This may cause the skin on your feet to become thinner, break easier, and heal more slowly. Your skin may become dry, and the skin may peel and crack. You may also have nerve damage in your legs and feet causing decreased feeling in them. You may not notice minor injuries to your feet that could lead to infections or more serious problems. Taking care of your feet is one of the most important things you can do for yourself.  HOME CARE INSTRUCTIONS  Wear shoes at all times, even in the house. Do not go barefoot. Bare feet are easily injured.  Check your feet daily for blisters, cuts, and redness. If you cannot see the bottom of your feet, use a mirror or ask someone for help.  Wash your feet with warm water (do not use hot water) and mild soap. Then pat your feet and the areas between your toes until they are completely dry. Do not soak your feet as this can dry your skin.  Apply a moisturizing lotion or petroleum jelly (that does not contain alcohol and is unscented) to the skin on your feet and to dry, brittle toenails. Do not apply lotion between your toes.  Trim your toenails straight across. Do not dig under them or around the cuticle. File the edges of your nails with an emery board or nail file.  Do not cut corns or calluses or try to remove them with medicine.  Wear clean socks or stockings every day. Make sure they are not too tight. Do not wear knee-high stockings since they may decrease blood flow to your legs.  Wear shoes that fit properly and have enough cushioning. To break in new shoes, wear them for just a few hours a day. This prevents you from injuring your feet. Always look in your shoes before you put them on to be sure there are no objects inside.  Do not cross your legs. This may decrease the blood flow to your feet.  If you find a minor scrape,  cut, or break in the skin on your feet, keep it and the skin around it clean and dry. These areas may be cleansed with mild soap and water. Do not cleanse the area with peroxide, alcohol, or iodine.  When you remove an adhesive bandage, be sure not to damage the skin around it.  If you have a wound, look at it several times a day to make sure it is healing.  Do not use heating pads or hot water bottles. They may burn your skin. If you have lost feeling in your feet or legs, you may not know it is happening until it is too late.  Make sure your health care provider performs a complete foot exam at least annually or more often if you have foot problems. Report any cuts, sores, or bruises to your health care provider immediately. SEEK MEDICAL CARE IF:   You have an injury that is not healing.  You have cuts or breaks in the skin.  You have an ingrown nail.  You notice redness on your legs or feet.  You feel burning or tingling in your legs or feet.  You have pain or cramps in your legs and feet.  Your legs or feet are numb.  Your feet always feel cold. SEEK IMMEDIATE MEDICAL CARE IF:   There is increasing redness,   swelling, or pain in or around a wound.  There is a red line that goes up your leg.  Pus is coming from a wound.  You develop a fever or as directed by your health care provider.  You notice a bad smell coming from an ulcer or wound. Document Released: 08/29/2000 Document Revised: 05/04/2013 Document Reviewed: 02/08/2013 ExitCare Patient Information 2015 [employer]. This information is not intended to replace advice given to you by your health care provider. Make sure you discuss any questions you have with your health care provider.  

## 2015-07-31 ENCOUNTER — Encounter: Payer: Self-pay | Admitting: Podiatry

## 2015-07-31 ENCOUNTER — Ambulatory Visit (INDEPENDENT_AMBULATORY_CARE_PROVIDER_SITE_OTHER): Payer: Medicare Other | Admitting: Podiatry

## 2015-07-31 DIAGNOSIS — M79674 Pain in right toe(s): Secondary | ICD-10-CM

## 2015-07-31 DIAGNOSIS — M79675 Pain in left toe(s): Secondary | ICD-10-CM | POA: Diagnosis not present

## 2015-07-31 DIAGNOSIS — B351 Tinea unguium: Secondary | ICD-10-CM | POA: Diagnosis not present

## 2015-07-31 NOTE — Progress Notes (Signed)
Patient ID: [name] [name], male   DOB: 12-Jun-1929, 81 y.o.   MRN: [id number]  Subjective: This patient presents for scheduled visit complaining of painful toenails and walking wearing shoes and requests nail debridement  Objective: Patient orientated 3 with wife present treatment room The toenails are brittle, elongated, incurvated, hypertrophic and tender to direct palpation 6-10  Assessment: Symptomatic onychomycoses 6-10 Diabetic  Plan: Debrided toenails 10 and can mechanically and electrically without any bleeding  Reappoint 3 months

## 2015-07-31 NOTE — Patient Instructions (Signed)
Diabetes and Foot Care Diabetes may cause you to have problems because of poor blood supply (circulation) to your feet and legs. This may cause the skin on your feet to become thinner, break easier, and heal more slowly. Your skin may become dry, and the skin may peel and crack. You may also have nerve damage in your legs and feet causing decreased feeling in them. You may not notice minor injuries to your feet that could lead to infections or more serious problems. Taking care of your feet is one of the most important things you can do for yourself.  HOME CARE INSTRUCTIONS  Wear shoes at all times, even in the house. Do not go barefoot. Bare feet are easily injured.  Check your feet daily for blisters, cuts, and redness. If you cannot see the bottom of your feet, use a mirror or ask someone for help.  Wash your feet with warm water (do not use hot water) and mild soap. Then pat your feet and the areas between your toes until they are completely dry. Do not soak your feet as this can dry your skin.  Apply a moisturizing lotion or petroleum jelly (that does not contain alcohol and is unscented) to the skin on your feet and to dry, brittle toenails. Do not apply lotion between your toes.  Trim your toenails straight across. Do not dig under them or around the cuticle. File the edges of your nails with an emery board or nail file.  Do not cut corns or calluses or try to remove them with medicine.  Wear clean socks or stockings every day. Make sure they are not too tight. Do not wear knee-high stockings since they may decrease blood flow to your legs.  Wear shoes that fit properly and have enough cushioning. To break in new shoes, wear them for just a few hours a day. This prevents you from injuring your feet. Always look in your shoes before you put them on to be sure there are no objects inside.  Do not cross your legs. This may decrease the blood flow to your feet.  If you find a minor scrape,  cut, or break in the skin on your feet, keep it and the skin around it clean and dry. These areas may be cleansed with mild soap and water. Do not cleanse the area with peroxide, alcohol, or iodine.  When you remove an adhesive bandage, be sure not to damage the skin around it.  If you have a wound, look at it several times a day to make sure it is healing.  Do not use heating pads or hot water bottles. They may burn your skin. If you have lost feeling in your feet or legs, you may not know it is happening until it is too late.  Make sure your health care provider performs a complete foot exam at least annually or more often if you have foot problems. Report any cuts, sores, or bruises to your health care provider immediately. SEEK MEDICAL CARE IF:   You have an injury that is not healing.  You have cuts or breaks in the skin.  You have an ingrown nail.  You notice redness on your legs or feet.  You feel burning or tingling in your legs or feet.  You have pain or cramps in your legs and feet.  Your legs or feet are numb.  Your feet always feel cold. SEEK IMMEDIATE MEDICAL CARE IF:   There is increasing redness,   swelling, or pain in or around a wound.  There is a red line that goes up your leg.  Pus is coming from a wound.  You develop a fever or as directed by your health care provider.  You notice a bad smell coming from an ulcer or wound.   This information is not intended to replace advice given to you by your health care provider. Make sure you discuss any questions you have with your health care provider.   Document Released: 08/29/2000 Document Revised: 05/04/2013 Document Reviewed: 02/08/2013 Elsevier Interactive Patient Education 2016 [employer]  

## 2015-08-15 ENCOUNTER — Ambulatory Visit: Payer: Medicare Other | Admitting: Neurology

## 2015-08-20 ENCOUNTER — Encounter: Payer: Self-pay | Admitting: Neurology

## 2015-08-20 ENCOUNTER — Ambulatory Visit (INDEPENDENT_AMBULATORY_CARE_PROVIDER_SITE_OTHER): Payer: Medicare Other | Admitting: Neurology

## 2015-08-20 ENCOUNTER — Encounter (INDEPENDENT_AMBULATORY_CARE_PROVIDER_SITE_OTHER): Payer: Self-pay

## 2015-08-20 VITALS — BP 120/76 | HR 62 | Resp 20 | Ht 69.5 in | Wt 211.0 lb

## 2015-08-20 DIAGNOSIS — G250 Essential tremor: Secondary | ICD-10-CM | POA: Insufficient documentation

## 2015-08-20 DIAGNOSIS — G3184 Mild cognitive impairment, so stated: Secondary | ICD-10-CM

## 2015-08-20 DIAGNOSIS — G253 Myoclonus: Secondary | ICD-10-CM | POA: Insufficient documentation

## 2015-08-20 HISTORY — DX: Myoclonus: G25.3

## 2015-08-20 HISTORY — DX: Essential tremor: G25.0

## 2015-08-20 HISTORY — DX: Mild cognitive impairment of uncertain or unknown etiology: G31.84

## 2015-08-20 MED ORDER — CITALOPRAM HYDROBROMIDE 20 MG PO TABS
20.0000 mg | ORAL_TABLET | Freq: Every day | ORAL | Status: DC
Start: 2015-08-20 — End: 2016-08-04

## 2015-08-20 NOTE — Progress Notes (Signed)
PATIENT: [name] [name] [name] DOB: Dec 16, 1928  REASON FOR VISIT: follow up for memory loss HISTORY FROM: patient  HISTORY OF PRESENT ILLNESS:  [name] [name] [name] is now 81 years old, and comes in for follow up after 6 month with his wife, [name].  He had lip, tongue and throat swelling with Namenda and Aricept.   His short term memory is impaired but his attention/calculation and visuo-spatial perception is not affected. He scored today 25 out of 30 possible points on MMSE, compared to 27 one year ago . Since retirement he ha lost more often track of the date and day of the week. [name] [name] [name] underwent a Mini-Mental Status Examination today again he was not quite sure about the day of the week but he could name the season the year and the month. He was fully oriented to place could perform a serial 7 and recall 3 out of 3 words. He had some trouble with repetition and is writing a sentence or copy a design. It seems that there is more visual spatial difficulties also the clock face had its hands placed correctly. His animal fluency test was 13.  The patient also brought me a short list of current symptoms #1 he has tremors sometimes making it difficult for him to even feed himself. He feels overall weaker, he has more forgetfulness and confusion. And his wife has noticed that at night while sleeping he has body jerks or myoclonic jerks. Sometimes there is so violent that she gets woken up and the bed was shaking. He seems not to be quite aware of the intensity. She is also suspecting some depression symptoms.   04/27/12 PRIOR HPI (CD):  I have seen" [name] [name]" [name] [name] [name] many years ago for memory loss and he is now here , I believe over  5 years later. His wife is my patient as well.   MMSE and MOCA were planned for today , and medication therapy  to be initiated or revised.  He has been known to take his medication wrong , forgets or doubles it at times, and ended up in hospital . His wife reported   he lost 3 mobile phones in  a couple of month.  He is still active , they  takes  week end trips together and her does much of the shopping.   His wife  indicates that he is making excuses for  memory lapses,  and she is concerned. He took a double dose of Lantus insulin and she remarked he  is short tempered when corrected or questioned, had some improvement on an SSRI. He had to discontinue  Aricept due to side effects and would like to see what else is meanwhile on the market. He physicaly doing well. He has some RLS, he has leg jumping and jerking, myoclonus.   Review of Systems  Out of a complete 14 system review, the patient complains of only the following symptoms, and all other reviewed systems are negative.  Eyes: Loss of vision in R, eye discharge in R, light sensitivity  Hematology/Lymphatic: Easy bruising   Neurological: Memory loss/ tremor    Sleep: Snoring   Restless legs, apnea   Genitourinary: Impotence (Men)  Frequency of urination  Allergy/Immunology: Allergies     Psychiatric: Confusion , Anxiety , loss of memory 25-30 MMSE. Comments: loss of vision in R eye  ALLERGIES: Allergies  Allergen Reactions  . Donepezil Swelling  . Namenda [Memantine Hcl] Swelling  . Robaxin [Methocarbamol] Shortness Of  Breath and Swelling  . Lisinopril Swelling    HOME MEDICATIONS: Outpatient Prescriptions Prior to Visit  Medication Sig Dispense Refill  . B-D ULTRAFINE III SHORT PEN 31G X 8 MM MISC   1  . Cholecalciferol (VITAMIN D-3 PO) Take 1 tablet by mouth daily.    . citalopram (CELEXA) 10 MG tablet 10 mg daily.     Marland Kitchen co-enzyme Q-10 30 MG capsule Take 30 mg by mouth daily.    Marland Kitchen doxazosin (CARDURA) 4 MG tablet Take 4 mg by mouth daily.    . folic acid (FOLVITE) 800 MCG tablet Take 800 mcg by mouth daily.    . insulin glargine (LANTUS SOLOSTAR) 100 UNIT/ML injection Inject 22 Units into the skin every evening.    . loratadine-pseudoephedrine (CLARITIN-D 24-HOUR) 10-240 MG per 24 hr tablet  Take 1 tablet by mouth daily as needed for allergies.    . metFORMIN (GLUCOPHAGE) 1000 MG tablet Take 1,000 mg by mouth 2 (two) times daily with a meal.    . methotrexate 2.5 MG tablet Take 15 mg by mouth once a week. Saturday    . Multiple Vitamin (MULTIVITAMIN WITH MINERALS) TABS Take 1 tablet by mouth daily.    Marland Kitchen omeprazole (PRILOSEC) 40 MG capsule Take 40 mg by mouth daily.  0  . omeprazole (PRILOSEC) 10 MG capsule Take 10 mg by mouth daily.    . simvastatin (ZOCOR) 80 MG tablet Take 80 mg by mouth at bedtime.     No facility-administered medications prior to visit.     PHYSICAL EXAM  Filed Vitals:   08/20/15 0822  BP: 120/76  Pulse: 62  Resp: 20  Height: 5' 9.5" (1.765 m)  Weight: 211 lb (95.709 kg)   Body mass index is 30.72 kg/(m^2).  Physical Exam  General: This patient is alert and cooperative at the time of the examination. Head: normocephlaic  Ears, Nose and Throat: mallompati 3 , neck size 17 inches.  Neck: supple , thick  Respiratory: LCTA Cardiovascular: RRR Skin: No significant peripheral edema is noted. Trunk: obese ,  Myoclonic twitches at night.  Neurologic Exam  MMSE - Mini Mental State Exam 08/20/2015 07/04/2014  Orientation to time 3 3  Orientation to Place 5 5  Registration 3 3  Attention/ Calculation 5 4  Recall 3 3  Language- name 2 objects 2 2  Language- repeat 0 1  Language- follow 3 step command 3 3  Language- read & follow direction 1 1  Write a sentence 0 1  Write a sentence-comments incomplete sentence -  Copy design 0 1  Total score 25 27    Mental Status: MMSE today is  25 out of 30, loss of recall, day and date, and AFT is  13 down from 19 points. During a visit in  2014 was 26 out of 30 and in 2013 was 25/30. Patient is hearing and vision impaired.  Cranial Nerves:  Full extraoccular movements.  Facial symmetry is present.  Speech is normal, no aphasia or dysarthria is noted. Visual fields are full.  Tongue and uvula midline.    Motor: Patient has good strength in all 4 extremities. Sensory: intact to pin prick, light touch Coordination: Patient has good finger-to-nose bilaterally, and strong grip Gait and Station: Patient has a normal gait, not wide based and not shuffling, normal arm swing.  Romberg is negative.  No drift is seen.   Reflexes: Deep tendon reflexes are symmetric.  04/28/2012  TSH  1.250 uIU/mL                 Homocyst(e)ine, Plasma  [H]  18.5            RPR                       Non Reactive                 Vitamin B12               672 pg/mL                          ASSESSMENT AND PLAN 81 y.o. year old male here for follow up of memory loss.  MMSE and MOCA testing suggestive of mild cognitive impairment in this 81 year old patient. MMSE was 14 month ago  27/30, with no visuo-spatial or attention problems. Today 25-30 points, AFt declined .   He sometimes has some trouble with directions, left right confusion, but he is still very active and socially involved. His 2 adult daughters have moved close to the area with the couple resides.   Low geriatric depression score 2 points,  ADL independent.  The described tremor at night was myoclonic jerks seems to have increased since our last visit. He has also a tremor in daytime with action but also at rest, he has lost some grip strength. His tremor affects his ability to eat and drink. This is new. No Cog-wheeling.   The patient is also on Celexa 10 mg, which I consider very low-dose. He has tolerated that SSRI very well it may be worse for him going on to 20 mg daily. This should not have a negative effect on tremors may help with better continue sleep at night, and overall well-being.,  Exercises by walking for pleasure.  He loves game shows on TV, he answers all questions.  He has trouble with telephone conversation , especially with women's voices.   We talked about other treatments for memory loss, he is allergic to  Aricept and Namenda.  Fish Oil recommended. Patient has less physical activity - advised low carb diet,  He is treated for OSA by CPAP, Dr Earl Galasborne.  Last visit in August.    Rv in 6 month with NP, MMSE, Tremor and depression evaluation- always schedule for 30 minutes - alternating with me.   Windsor Zirkelbach, MD   08/20/2015, 8:45 AM [facility] [address], [address] [place], [place] [address] [phone]   Cc Dr. Earl Galasborne.

## 2015-08-20 NOTE — Patient Instructions (Signed)
Memory Compensation Strategies  1. Use "WARM" strategy.  W= write it down  A= associate it  R= repeat it  M= make a mental note  2.   You can keep a Memory Notebook.  Use a 3-ring notebook with sections for the following: calendar, important names and phone numbers,  medications, doctors' names/phone numbers, lists/reminders, and a section to journal what you did  each day.   3.    Use a calendar to write appointments down.  4.    Write yourself a schedule for the day.  This can be placed on the calendar or in a separate section of the Memory Notebook.  Keeping a  regular schedule can help memory.  5.    Use medication organizer with sections for each day or morning/evening pills.  You may need help loading it  6.    Keep a basket, or pegboard by the door.  Place items that you need to take out with you in the basket or on the pegboard.  You may also want to  include a message board for reminders.  7.    Use sticky notes.  Place sticky notes with reminders in a place where the task is performed.  For example: " turn off the  stove" placed by the stove, "lock the door" placed on the door at eye level, " take your medications" on  the bathroom mirror or by the place where you normally take your medications.  8.    Use alarms/timers.  Use while cooking to remind yourself to check on food or as a reminder to take your medicine, or as a  reminder to make a call, or as a reminder to perform another task, etc. Management of Memory Problems  There are some general things you can do to help manage your memory problems.  Your memory may not in fact recover, but by using techniques and strategies you will be able to manage your memory difficulties better.  1)  Establish a routine.  Try to establish and then stick to a regular routine.  By doing this, you will get used to what to expect and you will reduce the need to rely on your memory.  Also, try to do things at the same time of day, such as  taking your medication or checking your calendar first thing in the morning.  Think about think that you can do as a part of a regular routine and make a list.  Then enter them into a daily planner to remind you.  This will help you establish a routine.  2)  Organize your environment.  Organize your environment so that it is uncluttered.  Decrease visual stimulation.  Place everyday items such as keys or cell phone in the same place every day (ie.  Basket next to front door)  Use post it notes with a brief message to yourself (ie. Turn off light, lock the door)  Use labels to indicate where things go (ie. Which cupboards are for food, dishes, etc.)  Keep a notepad and pen by the telephone to take messages  3)  Memory Aids  A diary or journal/notebook/daily planner  Making a list (shopping list, chore list, to do list that needs to be done)  Using an alarm as a reminder (kitchen timer or cell phone alarm)  Using cell phone to store information (Notes, Calendar, Reminders)  Calendar/White board placed in a prominent position  Post-it notes  In order for memory aids to   be useful, you need to have good habits.  It's no good remembering to make a note in your journal if you don't remember to look in it.  Try setting aside a certain time of day to look in journal.  4)  Improving mood and managing fatigue.  There may be other factors that contribute to memory difficulties.  Factors, such as anxiety, depression and tiredness can affect memory.  Regular gentle exercise can help improve your mood and give you more energy.  Simple relaxation techniques may help relieve symptoms of anxiety  Try to get back to completing activities or hobbies you enjoyed doing in the past.  Learn to pace yourself through activities to decrease fatigue.  Find out about some local support groups where you can share experiences with others.  Try and achieve 7-8 hours of sleep at night. 

## 2015-11-06 ENCOUNTER — Ambulatory Visit (INDEPENDENT_AMBULATORY_CARE_PROVIDER_SITE_OTHER): Payer: Medicare Other | Admitting: Podiatry

## 2015-11-06 ENCOUNTER — Encounter: Payer: Self-pay | Admitting: Podiatry

## 2015-11-06 DIAGNOSIS — M79675 Pain in left toe(s): Secondary | ICD-10-CM | POA: Diagnosis not present

## 2015-11-06 DIAGNOSIS — M79674 Pain in right toe(s): Secondary | ICD-10-CM | POA: Diagnosis not present

## 2015-11-06 DIAGNOSIS — B351 Tinea unguium: Secondary | ICD-10-CM

## 2015-11-06 NOTE — Patient Instructions (Signed)
There is a small amount of bleeding after trimming the third left toenail which was treated with antibiotic ointment and Band-Aid. Removed Band-Aid 1-3 days and apply topical antibiotic ointment and a Band-Aid daily until a scab forms  Diabetes and Foot Care Diabetes may cause you to have problems because of poor blood supply (circulation) to your feet and legs. This may cause the skin on your feet to become thinner, break easier, and heal more slowly. Your skin may become dry, and the skin may peel and crack. You may also have nerve damage in your legs and feet causing decreased feeling in them. You may not notice minor injuries to your feet that could lead to infections or more serious problems. Taking care of your feet is one of the most important things you can do for yourself.  HOME CARE INSTRUCTIONS  Wear shoes at all times, even in the house. Do not go barefoot. Bare feet are easily injured.  Check your feet daily for blisters, cuts, and redness. If you cannot see the bottom of your feet, use a mirror or ask someone for help.  Wash your feet with warm water (do not use hot water) and mild soap. Then pat your feet and the areas between your toes until they are completely dry. Do not soak your feet as this can dry your skin.  Apply a moisturizing lotion or petroleum jelly (that does not contain alcohol and is unscented) to the skin on your feet and to dry, brittle toenails. Do not apply lotion between your toes.  Trim your toenails straight across. Do not dig under them or around the cuticle. File the edges of your nails with an emery board or nail file.  Do not cut corns or calluses or try to remove them with medicine.  Wear clean socks or stockings every day. Make sure they are not too tight. Do not wear knee-high stockings since they may decrease blood flow to your legs.  Wear shoes that fit properly and have enough cushioning. To break in new shoes, wear them for just a few hours a  day. This prevents you from injuring your feet. Always look in your shoes before you put them on to be sure there are no objects inside.  Do not cross your legs. This may decrease the blood flow to your feet.  If you find a minor scrape, cut, or break in the skin on your feet, keep it and the skin around it clean and dry. These areas may be cleansed with mild soap and water. Do not cleanse the area with peroxide, alcohol, or iodine.  When you remove an adhesive bandage, be sure not to damage the skin around it.  If you have a wound, look at it several times a day to make sure it is healing.  Do not use heating pads or hot water bottles. They may burn your skin. If you have lost feeling in your feet or legs, you may not know it is happening until it is too late.  Make sure your health care provider performs a complete foot exam at least annually or more often if you have foot problems. Report any cuts, sores, or bruises to your health care provider immediately. SEEK MEDICAL CARE IF:   You have an injury that is not healing.  You have cuts or breaks in the skin.  You have an ingrown nail.  You notice redness on your legs or feet.  You feel burning or tingling in  your legs or feet.  You have pain or cramps in your legs and feet.  Your legs or feet are numb.  Your feet always feel cold. SEEK IMMEDIATE MEDICAL CARE IF:   There is increasing redness, swelling, or pain in or around a wound.  There is a red line that goes up your leg.  Pus is coming from a wound.  You develop a fever or as directed by your health care provider.  You notice a bad smell coming from an ulcer or wound.   This information is not intended to replace advice given to you by your health care provider. Make sure you discuss any questions you have with your health care provider.   Document Released: 08/29/2000 Document Revised: 05/04/2013 Document Reviewed: 02/08/2013 Elsevier Interactive Patient Education  [employer].

## 2015-11-06 NOTE — Progress Notes (Signed)
Patient ID: [name] [name], male   DOB: 1929-08-25, 82 y.o.   MRN: [id number]  Subjective: This patient presents for scheduled visit complaining of painful toenails and walking wearing shoes and requests nail debridement  Objective: Patient orientated 3 with wife present treatment room The toenails are brittle, elongated, incurvated, hypertrophic and tender to direct palpation 6-10  Assessment: Symptomatic onychomycoses 6-10 Diabetic  Plan: Debrided toenails 10 and can mechanically and electrically, with slight bleeding distal third left toe treated with antibiotic ointment and Band-Aid. Patient advised of this and instructed leave Band-Aid on third left toe 1-3 days and reapply antibiotic ointment and Band-Aid to this area until a scab forms  Reappoint 3 months

## 2016-02-12 ENCOUNTER — Ambulatory Visit: Payer: Medicare Other | Admitting: Podiatry

## 2016-02-18 ENCOUNTER — Telehealth: Payer: Self-pay | Admitting: *Deleted

## 2016-02-18 ENCOUNTER — Ambulatory Visit: Payer: Medicare Other | Admitting: Adult Health

## 2016-02-18 NOTE — Telephone Encounter (Signed)
Patient canceled appointment the morning it was scheduled.  He had a recent dental procedure and was not feeling well.

## 2016-02-20 ENCOUNTER — Encounter: Payer: Self-pay | Admitting: Adult Health

## 2016-02-27 ENCOUNTER — Ambulatory Visit: Payer: Medicare Other | Admitting: Podiatry

## 2016-02-27 ENCOUNTER — Encounter: Payer: Self-pay | Admitting: Adult Health

## 2016-02-27 ENCOUNTER — Ambulatory Visit (INDEPENDENT_AMBULATORY_CARE_PROVIDER_SITE_OTHER): Payer: Medicare Other | Admitting: Adult Health

## 2016-02-27 VITALS — BP 136/60 | HR 74 | Resp 18 | Ht 69.5 in | Wt 215.0 lb

## 2016-02-27 DIAGNOSIS — R413 Other amnesia: Secondary | ICD-10-CM | POA: Diagnosis not present

## 2016-02-27 DIAGNOSIS — R251 Tremor, unspecified: Secondary | ICD-10-CM

## 2016-02-27 NOTE — Progress Notes (Signed)
I agree with the assessment and plan as directed by NP .The patient is known to me .   Tejasvi Brissett, MD  

## 2016-02-27 NOTE — Progress Notes (Signed)
PATIENT: [name] [name] DOB: [birth year]  REASON FOR VISIT: follow up- cognitive impairment, tremors HISTORY FROM: patient  HISTORY OF PRESENT ILLNESS [name] [name] is a 82 year old male with a history of cognitive impairment and tremors. He returns today for an evaluation. The patient feels that his memory has remained the same. He is able to complete all ADLs independently. He operates a motor vehicle without difficulty according to the patient. However in the background his wife is shaking her head no. She states that he does drive but she is always with him to help with directions. The patient reports he does not feel his balance is as good as it used to be. He does have a tremor in the hands. He states that it is typically worse in the morning. He notices it when he is trying to eat his breakfast. He does feel that he gets better as the day goes on. Sometimes he reports that he gets anxious and the tremor will get worse and sometimes he also moves his foot with anxiety. He denies any new neurological symptoms. He returns today for an evaluation.   HISTORY 08/20/15: [name] [name] is now 82 years old, and comes in for follow up after 6 month with his wife, [name].  He had lip, tongue and throat swelling with Namenda and Aricept.  His short term memory is impaired but his attention/calculation and visuo-spatial perception is not affected. He scored today 25 out of 30 possible points on MMSE, compared to 27 one year ago . Since retirement he ha lost more often track of the date and day of the week. Mr. Woodmansee underwent a Mini-Mental Status Examination today again he was not quite sure about the day of the week but he could name the season the year and the month. He was fully oriented to place could perform a serial 7 and recall 3 out of 3 words. He had some trouble with repetition and is writing a sentence or copy a design. It seems that there is more visual spatial difficulties also the  clock face had its hands placed correctly. His animal fluency test was 13.  The patient also brought me a short list of current symptoms #1 he has tremors sometimes making it difficult for him to even feed himself. He feels overall weaker, he has more forgetfulness and confusion. And his wife has noticed that at night while sleeping he has body jerks or myoclonic jerks. Sometimes there is so violent that she gets woken up and the bed was shaking. He seems not to be quite aware of the intensity. She is also suspecting some depression symptoms.   04/27/12 PRIOR HPI (CD): I have seen" [name]" Abel [name] many years ago for memory loss and he is now here , I believe over 5 years later. His wife is my patient as well.  MMSE and MOCA were planned for today , and medication therapy to be initiated or revised. He has been known to take his medication wrong , forgets or doubles it at times, and ended up in hospital . His wife reported he lost 3 mobile phones in a couple of month. He is still active , they takes week end trips together and her does much of the shopping. His wife indicates that he is making excuses for memory lapses, and she is concerned. He took a double dose of Lantus insulin and she remarked he is short tempered when corrected or questioned, had some improvement on an SSRI.  He had to discontinue Aricept due to side effects and would like to see what else is meanwhile on the market. He physicaly doing well. He has some RLS, he has leg jumping and jerking, myoclonus.   REVIEW OF SYSTEMS: Out of a complete 14 system review of symptoms, the patient complains only of the following symptoms, and all other reviewed systems are negative.  Fatigue, hearing loss, shortness of breath, murmur, eye itching, eye redness, restless leg, apnea, snoring, sleep talking, rash, moles, walking difficulty, joint pain, environmental allergies, bruise/bleed easily, memory loss, tremors, decreased  concentration, nervous/anxious  ALLERGIES: Allergies  Allergen Reactions  . Donepezil Swelling  . Namenda [Memantine Hcl] Swelling  . Robaxin [Methocarbamol] Shortness Of Breath and Swelling  . Lisinopril Swelling    HOME MEDICATIONS: Outpatient Prescriptions Prior to Visit  Medication Sig Dispense Refill  . B-D ULTRAFINE III SHORT PEN 31G X 8 MM MISC   1  . Cholecalciferol (VITAMIN D-3 PO) Take 1 tablet by mouth daily.    . citalopram (CELEXA) 20 MG tablet Take 1 tablet (20 mg total) by mouth daily. 90 tablet 3  . co-enzyme Q-10 30 MG capsule Take 30 mg by mouth daily.    Marland Kitchen doxazosin (CARDURA) 4 MG tablet Take 4 mg by mouth daily.    . folic acid (FOLVITE) 800 MCG tablet Take 800 mcg by mouth daily.    . insulin glargine (LANTUS SOLOSTAR) 100 UNIT/ML injection Inject 22 Units into the skin every evening.    . loratadine-pseudoephedrine (CLARITIN-D 24-HOUR) 10-240 MG per 24 hr tablet Take 1 tablet by mouth daily as needed for allergies.    . metFORMIN (GLUCOPHAGE) 1000 MG tablet Take 1,000 mg by mouth 2 (two) times daily with a meal.    . methotrexate 2.5 MG tablet Take 15 mg by mouth once a week. Saturday    . Multiple Vitamin (MULTIVITAMIN WITH MINERALS) TABS Take 1 tablet by mouth daily.    Marland Kitchen omeprazole (PRILOSEC) 40 MG capsule Take 40 mg by mouth daily.  0  . simvastatin (ZOCOR) 40 MG tablet Take 40 mg by mouth daily.  0   No facility-administered medications prior to visit.    PAST MEDICAL HISTORY: Past Medical History  Diagnosis Date  . Diabetes mellitus   . Sleep apnea     uses CPAP at night to sleep  . Memory loss     PAST SURGICAL HISTORY: Past Surgical History  Procedure Laterality Date  . Hernia repair    . Replacement total knee      FAMILY HISTORY: Family History  Problem Relation Age of Onset  . Liver disease Mother   . Cancer - Lung Father     SOCIAL HISTORY: Social History   Social History  . Marital Status: Married    Spouse Name: betty  .  Number of Children: 2  . Years of Education: 12th   Occupational History  . retired    Social History Main Topics  . Smoking status: Never Smoker   . Smokeless tobacco: Not on file  . Alcohol Use: No  . Drug Use: No  . Sexual Activity: Not on file   Other Topics Concern  . Not on file   Social History Narrative   Patient lives at home and drinks caffinated  Drinks daily. Married, 2 kids.  Caffeine 2-3 cups daily.    Hs Graduate.  Retired.        PHYSICAL EXAM  Filed Vitals:   02/27/16 1430  BP:  136/60  Pulse: 74  Resp: 18  Height: 5' 9.5" (1.765 m)  Weight: 215 lb (97.523 kg)   Body mass index is 31.31 kg/(m^2).  Generalized: Well developed, in no acute distress   Neurological examination  Mentation: Alert oriented to time, place, history taking. Follows all commands speech and language fluent Cranial nerve II-XII: Pupils were equal round reactive to light. Extraocular movements were full, visual field were full on confrontational test. Facial sensation and strength were normal. Uvula tongue midline. Head turning and shoulder shrug  were normal and symmetric. Motor: The motor testing reveals 5 over 5 strength of all 4 extremities. Good symmetric motor tone is noted throughout.  Sensory: Sensory testing is intact to soft touch on all 4 extremities. No evidence of extinction is noted.  Coordination: Cerebellar testing reveals good finger-nose-finger and heel-to-shin bilaterally.  Gait and station: Gait is normal. Tandem gait is normal. Romberg is negative. No drift is seen.  Reflexes: Deep tendon reflexes are symmetric and normal bilaterally.   DIAGNOSTIC DATA (LABS, IMAGING, TESTING) - I reviewed patient records, labs, notes, testing and imaging myself where available.  ASSESSMENT AND PLAN 82 y.o. year old male  has a past medical history of Diabetes mellitus; Sleep apnea; and Memory loss. here with:  1.  memory disturbance 2. Tremors  The patient's memory score  has slightly declined. He is unable to tolerate Aricept and Namenda due to being allergic. We will continue to monitor his memory. The patient was advised that he could consider weighted utensils to help with tremor especially when eating. Patient verbalized understanding. He will follow-up in 6 months or sooner if needed.    Butch PennyMegan Deedra Pro, MSN, NP-C 02/27/2016, 3:08 PM [facility] [address], [address] [place], [place] [address] [phone]

## 2016-02-27 NOTE — Patient Instructions (Signed)
Memory score is slightly decreased. Will continue to monitor Consider weighted utensils to help with tremor when eating If your symptoms worsen or you develop new symptoms please let us know.

## 2016-03-04 ENCOUNTER — Encounter: Payer: Self-pay | Admitting: Podiatry

## 2016-03-04 ENCOUNTER — Ambulatory Visit (INDEPENDENT_AMBULATORY_CARE_PROVIDER_SITE_OTHER): Payer: Medicare Other | Admitting: Podiatry

## 2016-03-04 DIAGNOSIS — B351 Tinea unguium: Secondary | ICD-10-CM

## 2016-03-04 DIAGNOSIS — M79675 Pain in left toe(s): Secondary | ICD-10-CM

## 2016-03-04 DIAGNOSIS — M79674 Pain in right toe(s): Secondary | ICD-10-CM

## 2016-03-04 NOTE — Patient Instructions (Signed)
Diabetes and Foot Care Diabetes may cause you to have problems because of poor blood supply (circulation) to your feet and legs. This may cause the skin on your feet to become thinner, break easier, and heal more slowly. Your skin may become dry, and the skin may peel and crack. You may also have nerve damage in your legs and feet causing decreased feeling in them. You may not notice minor injuries to your feet that could lead to infections or more serious problems. Taking care of your feet is one of the most important things you can do for yourself.  HOME CARE INSTRUCTIONS  Wear shoes at all times, even in the house. Do not go barefoot. Bare feet are easily injured.  Check your feet daily for blisters, cuts, and redness. If you cannot see the bottom of your feet, use a mirror or ask someone for help.  Wash your feet with warm water (do not use hot water) and mild soap. Then pat your feet and the areas between your toes until they are completely dry. Do not soak your feet as this can dry your skin.  Apply a moisturizing lotion or petroleum jelly (that does not contain alcohol and is unscented) to the skin on your feet and to dry, brittle toenails. Do not apply lotion between your toes.  Trim your toenails straight across. Do not dig under them or around the cuticle. File the edges of your nails with an emery board or nail file.  Do not cut corns or calluses or try to remove them with medicine.  Wear clean socks or stockings every day. Make sure they are not too tight. Do not wear knee-high stockings since they may decrease blood flow to your legs.  Wear shoes that fit properly and have enough cushioning. To break in new shoes, wear them for just a few hours a day. This prevents you from injuring your feet. Always look in your shoes before you put them on to be sure there are no objects inside.  Do not cross your legs. This may decrease the blood flow to your feet.  If you find a minor scrape,  cut, or break in the skin on your feet, keep it and the skin around it clean and dry. These areas may be cleansed with mild soap and water. Do not cleanse the area with peroxide, alcohol, or iodine.  When you remove an adhesive bandage, be sure not to damage the skin around it.  If you have a wound, look at it several times a day to make sure it is healing.  Do not use heating pads or hot water bottles. They may burn your skin. If you have lost feeling in your feet or legs, you may not know it is happening until it is too late.  Make sure your health care provider performs a complete foot exam at least annually or more often if you have foot problems. Report any cuts, sores, or bruises to your health care provider immediately. SEEK MEDICAL CARE IF:   You have an injury that is not healing.  You have cuts or breaks in the skin.  You have an ingrown nail.  You notice redness on your legs or feet.  You feel burning or tingling in your legs or feet.  You have pain or cramps in your legs and feet.  Your legs or feet are numb.  Your feet always feel cold. SEEK IMMEDIATE MEDICAL CARE IF:   There is increasing redness,   swelling, or pain in or around a wound.  There is a red line that goes up your leg.  Pus is coming from a wound.  You develop a fever or as directed by your health care provider.  You notice a bad smell coming from an ulcer or wound.   This information is not intended to replace advice given to you by your health care provider. Make sure you discuss any questions you have with your health care provider.   Document Released: 08/29/2000 Document Revised: 05/04/2013 Document Reviewed: 02/08/2013 Elsevier Interactive Patient Education 2016 [employer]  

## 2016-03-04 NOTE — Progress Notes (Signed)
Patient ID: [name] [name], male   DOB: [birth year], 82 y.o.   MRN: [id number]  Subjective: This patient presents for scheduled visit complaining of painful toenails and walking wearing shoes and requests nail debridement, with wife present treatment room  Objective: Orientated 3 No open skin lesions bilaterally The toenails are brittle, elongated, incurvated, hypertrophic and tender to direct palpation 6-10  Assessment: Symptomatic onychomycoses 6-10 Diabetic  Plan: Debrided toenails 10 and can mechanically and electrically without any bleeding  Reappoint 3 months

## 2016-03-13 ENCOUNTER — Telehealth: Payer: Self-pay

## 2016-03-13 NOTE — Telephone Encounter (Signed)
I spoke to pt and pt's wife. I advised them that pt's appt on 9/14 needs to be rescheduled. Pt and pt's wife are agreeable to 9/12 at 3:00. Pt verbalized understanding of new appt date and time.

## 2016-05-27 ENCOUNTER — Ambulatory Visit: Payer: Self-pay | Admitting: Adult Health

## 2016-05-27 ENCOUNTER — Ambulatory Visit: Payer: 59 | Admitting: Rehabilitative and Restorative Service Providers"

## 2016-05-29 ENCOUNTER — Ambulatory Visit: Payer: 59 | Attending: Family Medicine | Admitting: Physical Therapy

## 2016-05-29 ENCOUNTER — Encounter: Payer: Self-pay | Admitting: Physical Therapy

## 2016-05-29 ENCOUNTER — Ambulatory Visit: Payer: Medicare Other | Admitting: Adult Health

## 2016-05-29 DIAGNOSIS — R2689 Other abnormalities of gait and mobility: Secondary | ICD-10-CM | POA: Diagnosis present

## 2016-05-29 DIAGNOSIS — R2681 Unsteadiness on feet: Secondary | ICD-10-CM | POA: Diagnosis present

## 2016-05-29 DIAGNOSIS — M6281 Muscle weakness (generalized): Secondary | ICD-10-CM

## 2016-05-29 NOTE — Therapy (Signed)
[name] [address] [place], [place], [address] Phone: [phone]   Fax:  [phone]  Physical Therapy Evaluation  Patient Details  Name: [name] [name] MRN: [id number] Date of Birth: 1928/10/18 Referring Provider: [name], MD  Encounter Date: 05/29/2016      PT End of Session - 05/29/16 1823    Visit Number 1   Number of Visits 17   Date for PT Re-Evaluation 07/28/16   Authorization Type [employer]   PT Start Time 1402   PT Stop Time 1445   PT Time Calculation (min) 43 min   Equipment Utilized During Treatment Gait belt   Activity Tolerance Patient limited by fatigue   Behavior During Therapy [facility] for tasks assessed/performed      Past Medical History:  Diagnosis Date  . Diabetes mellitus   . Memory loss   . Sleep apnea    uses CPAP at night to sleep    Past Surgical History:  Procedure Laterality Date  . HERNIA REPAIR    . REPLACEMENT TOTAL KNEE      There were no vitals filed for this visit.       Subjective Assessment - 05/29/16 1412    Subjective Pt reports onset of imbalance about 6 months ago. Pt has sustained at least 2 falls in the past 6 months. Fell on ice and injured head. Pt has been using a SPC for 2 months, per recomendation of MD.    Patient is accompained by: Family member  wife, [name] [name]   Pertinent History Likes to be called "[name]".  PMH significant for: DM, mild cognitive impairment with memory loss, dyslipidemia, sleep apnea, essential tremor, sleep apnea.   Patient Stated Goals To work on balance and walking.   Currently in Pain? No/denies            [name] PT Assessment - 05/29/16 1400      ROM / Strength   AROM / PROM / Strength AROM;Strength;PROM     AROM   Overall AROM  Deficits   Overall AROM Comments Limited L knee extension, B ankle DF.     PROM   Overall PROM  Deficits   Overall PROM Comments L knee extension limited to approx. -3 degrees,  B ankle DF limited to grossly 2-3 degrees due to ST restriction.     Strength   Overall Strength Deficits   Right/Left Hip Right;Left   Right Hip Flexion 4+/5   Left Hip Flexion 4+/5   Left Hip Extension --   Right/Left Knee Right;Left   Right Knee Flexion 4/5   Right Knee Extension 4/5   Right/Left Ankle Right;Left   Right Ankle Dorsiflexion 4/5   Right Ankle Plantar Flexion 4/5   Left Ankle Dorsiflexion 4/5   Left Ankle Plantar Flexion 4-/5     Ambulation/Gait   Ambulation/Gait Yes   Ambulation/Gait Assistance 5: Supervision;4: Min guard   Ambulation Distance (Feet) 200 Feet   Assistive device Straight cane   Gait Pattern Step-through pattern;Shuffle   Gait velocity 2.48 ft/sec  < 2.62 ft/sec = status of limited community ambulator     Balance   Balance Assessed Yes     Standardized Balance Assessment   Standardized Balance Assessment Berg Balance Test     Berg Balance Test   Sit to Stand Able to stand without using hands and stabilize independently   Standing Unsupported Able to stand safely 2 minutes   Sitting with Back Unsupported but Feet Supported  on Floor or Stool Able to sit safely and securely 2 minutes   Stand to Sit Uses backs of legs against chair to control descent   Transfers Needs one person to assist   Standing Unsupported with Eyes Closed Able to stand 10 seconds safely   Standing Ubsupported with Feet Together Able to place feet together independently and stand for 1 minute with supervision   From Standing, Reach Forward with Outstretched Arm Can reach forward >12 cm safely (5")   From Standing Position, Pick up Object from Floor Able to pick up shoe, needs supervision   From Standing Position, Turn to Look Behind Over each Shoulder Looks behind one side only/other side shows less weight shift   Turn 360 Degrees Needs close supervision or verbal cueing   Standing Unsupported, Alternately Place Feet on Step/Stool Needs assistance to keep from falling or  unable to try   Standing Unsupported, One Foot in Front Able to take small step independently and hold 30 seconds   Standing on One Leg Tries to lift leg/unable to hold 3 seconds but remains standing independently   Total Score 35     Functional Gait  Assessment   Gait assessed  --                   [facility] Adult PT Treatment/Exercise - 05/29/16 1400      Transfers   Transfers Sit to Stand   Sit to Stand 5: Supervision   Sit to Stand Details (indicate cue type and reason) Able to stand from standard chair without UE use, but requires (S).     Ambulation/Gait   Ambulation Surface Level;Indoor                PT Education - 05/29/16 1647    Education provided Yes   Education Details PT eval findings, goals, and POC.    Person(s) Educated Patient   Methods Explanation;Demonstration;Handout;Verbal cues   Comprehension Verbalized understanding;Returned demonstration          PT Short Term Goals - 05/29/16 1827      PT SHORT TERM GOAL #1   Title Pt will perform HEP with mod I using paper handout to maximize functional gains made in PT.  (Target: 06/26/16)     PT SHORT TERM GOAL #2   Title Complete 6MWT and improve distance by 75' from baseline to indicate significant improvement in functional endurance.      PT SHORT TERM GOAL #3   Title Pt will improve Berg from 35 to to 40/56 to indicate improved standing balance.       PT SHORT TERM GOAL #4   Title Pt will ambulate x200' over level, indoor surfaces with mod I using LRAD to indicate increased independence with household mobility.     PT SHORT TERM GOAL #5   Title Pt/wife will report compliance with walking program >/= 3 days/week to maximize functional endurance.             PT Long Term Goals - 05/29/16 1845      PT LONG TERM GOAL #1   Title Complete 6MWT and improve distance by 150' from baseline to indicate significant improvement in functional endurance.  (Target: 07/24/16)     PT LONG TERM  GOAL #2   Title Pt will improve Berg from 35 to >/= 45/56 to indicate decreased fall risk.      PT LONG TERM GOAL #3   Title Pt will ambulate x500' over unlevel, paved  surfaces with mod I using LRAD to indicate increased independence with community mobility.     PT LONG TERM GOAL #4   Title Pt will negotiate standard ramp and curb step with mod I using LRAD to indicate increased safety traversing community obstacles.      PT LONG TERM GOAL #5   Title Pt will transfer from supine on floor to seated on mat table with supervision using standard chair/mat table to indicate increased independence with fall recovery.               Plan - 06-05-16 1849    Clinical Impression Statement Pt is an 82 y/o M referred to outpatient PT to address frequent falls.  PMH significant for: DM, mild cognitive impairment with memory loss, dyslipidemia, sleep apnea, essential tremor, sleep apnea. PT evaluation reveals the following: significant fall risk, per Berg score; gait velocity suggestive of pt status of limited community ambulator; gait impairments; generalized weakness. Pt will benefit from skilled outpatient Pt 2x/week for 8 weeks to address said impairments.    Rehab Potential Fair   Clinical Impairments Affecting Rehab Potential cognitive impairments   PT Frequency 2x / week   PT Duration 8 weeks   PT Treatment/Interventions ADLs/Self Care Home Management;Therapeutic exercise;Balance training;Gait training;Neuromuscular re-education;Stair training;Functional mobility training;Therapeutic activities;Patient/family education;Orthotic Fit/Training   PT Next Visit Plan Complete . Initiate walking program and [employer].   Consulted and Agree with Plan of Care Patient;Family member/caregiver   Family Member Consulted wife, [name] [name]      Patient will benefit from skilled therapeutic intervention in order to improve the following deficits and impairments:  Abnormal gait, Decreased balance, Decreased  endurance, Decreased mobility, Decreased activity tolerance, Decreased strength, Impaired flexibility, Postural dysfunction, Increased edema  Visit Diagnosis: Other abnormalities of gait and mobility - Plan: PT plan of care cert/re-cert  Unsteadiness on feet - Plan: PT plan of care cert/re-cert  Muscle weakness (generalized) - Plan: PT plan of care cert/re-cert      G-Codes - 06-05-2016 1854    Functional Assessment Tool Used Sharlene Motts = 35/56   Functional Limitation Mobility: Walking and moving around   Mobility: Walking and Moving Around Current Status [phone]) At least 20 percent but less than 40 percent impaired, limited or restricted   Mobility: Walking and Moving Around Goal Status [phone]) At least 1 percent but less than 20 percent impaired, limited or restricted       Problem List Patient Active Problem List   Diagnosis Date Noted  . 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 Essential tremor 08/20/2015  . MCI (mild cognitive impairment) with memory loss 08/20/2015  . Memory loss 02/13/2015  . DM 07/19/2008  . DYSLIPIDEMIA 07/19/2008  . OBSTRUCTIVE SLEEP APNEA 07/19/2008   Jorje Guild, PT, DPT [facility] [address] [place], [place], [address] Phone: [phone]   Fax:  [phone] June 05, 2016, 6:56 PM  Name: [name] [name] MRN: [id number] Date of Birth: 1929/08/26

## 2016-06-04 ENCOUNTER — Encounter: Payer: Self-pay | Admitting: Adult Health

## 2016-06-04 ENCOUNTER — Ambulatory Visit (INDEPENDENT_AMBULATORY_CARE_PROVIDER_SITE_OTHER): Payer: Medicare Other | Admitting: Adult Health

## 2016-06-04 VITALS — BP 135/74 | HR 62 | Ht 69.5 in | Wt 218.6 lb

## 2016-06-04 DIAGNOSIS — R4189 Other symptoms and signs involving cognitive functions and awareness: Secondary | ICD-10-CM

## 2016-06-04 DIAGNOSIS — R251 Tremor, unspecified: Secondary | ICD-10-CM | POA: Diagnosis not present

## 2016-06-04 NOTE — Progress Notes (Signed)
I agree with the assessment and plan as directed by NP .The patient is known to me .   Aireanna Luellen, MD  

## 2016-06-04 NOTE — Patient Instructions (Signed)
Continue to monitor memory and tremor If your symptoms worsen or you develop new symptoms please let us know.

## 2016-06-04 NOTE — Progress Notes (Signed)
PATIENT: [name] [name] DOB: 02/07/1929  REASON FOR VISIT: follow up- cognitive impairment, tremors HISTORY FROM: patient  HISTORY OF PRESENT ILLNESS: [name] [name] is an 82 year old male with a history of cognitive impairment and tremors. He returns today for evaluation. The patient has been unable to tolerate Aricept and Namenda due to being "allergic." He feels that his memory has remained the same. He does operate a motor vehicle however his wife is always with him. He is able to complete all ADLs independently. He lives at home with his wife. He reports that his tremor has remained stable. It primarily affects the hands. In the past he's been encouraged to try a weighted utensils but he has not tried this. He states that he sleeps well at night. He is currently using the CPAP that is managed by Dr. Earl Gala. He returns today for an evaluation.   HISTORY 02/27/16: [name] [name] is a 82 year old male with a history of cognitive impairment and tremors. He returns today for an evaluation. The patient feels that his memory has remained the same. He is able to complete all ADLs independently. He operates a motor vehicle without difficulty according to the patient. However in the background his wife is shaking her head no. She states that he does drive but she is always with him to help with directions. The patient reports he does not feel his balance is as good as it used to be. He does have a tremor in the hands. He states that it is typically worse in the morning. He notices it when he is trying to eat his breakfast. He does feel that he gets better as the day goes on. Sometimes he reports that he gets anxious and the tremor will get worse and sometimes he also moves his foot with anxiety. He denies any new neurological symptoms. He returns today for an evaluation.   HISTORY 08/20/15: [name] [name] is now 82 years old, and comes in for follow up after 6 month with his wife, [name] [name].  He had lip,  tongue and throat swelling with Namenda and Aricept.  His short term memory is impaired but his attention/calculation and visuo-spatial perception is not affected. He scored today 25 out of 30 possible points on MMSE, compared to 27 one year ago . Since retirement he ha lost more often track of the date and day of the week. [name] [name] underwent a Mini-Mental Status Examination today again he was not quite sure about the day of the week but he could name the season the year and the month. He was fully oriented to place could perform a serial 7 and recall 3 out of 3 words. He had some trouble with repetition and is writing a sentence or copy a design. It seems that there is more visual spatial difficulties also the clock face had its hands placed correctly. His animal fluency test was 13.  The patient also brought me a short list of current symptoms #1 he has tremors sometimes making it difficult for him to even feed himself. He feels overall weaker, he has more forgetfulness and confusion. And his wife has noticed that at night while sleeping he has body jerks or myoclonic jerks. Sometimes there is so violent that she gets woken up and the bed was shaking. He seems not to be quite aware of the intensity. She is also suspecting some depression symptoms.   04/27/12 PRIOR HPI (CD): I have seen" [name] [name]" [name] [name] many years ago for memory  loss and he is now here , I believe over 5 years later. His wife is my patient as well.  MMSE and MOCA were planned for today , and medication therapy to be initiated or revised. He has been known to take his medication wrong , forgets or doubles it at times, and ended up in hospital . His wife reported he lost 3 mobile phones in a couple of month. He is still active , they takes week end trips together and her does much of the shopping. His wife indicates that he is making excuses for memory lapses, and she is concerned. He took a double dose of  Lantus insulin and she remarked he is short tempered when corrected or questioned, had some improvement on an SSRI. He had to discontinue Aricept due to side effects and would like to see what else is meanwhile on the market. He physicaly doing well. He has some RLS, he has leg jumping and jerking, myoclonus.    REVIEW OF SYSTEMS: Out of a complete 14 system review of symptoms, the patient complains only of the following symptoms, and all other reviewed systems are negative.  Joint pain, walking difficulty, moles, confusion, depression, tremors, dizziness, cough, wheezing, shortness breath, leg swelling, restless leg, snoring, sleep talking, fatigue  ALLERGIES: Allergies  Allergen Reactions  . Donepezil Swelling  . Namenda [Memantine Hcl] Swelling  . Robaxin [Methocarbamol] Shortness Of Breath and Swelling  . Lisinopril Swelling    HOME MEDICATIONS: Outpatient Medications Prior to Visit  Medication Sig Dispense Refill  . B-D ULTRAFINE III SHORT PEN 31G X 8 MM MISC   1  . Cholecalciferol (VITAMIN D-3 PO) Take 1 tablet by mouth daily.    . citalopram (CELEXA) 20 MG tablet Take 1 tablet (20 mg total) by mouth daily. 90 tablet 3  . co-enzyme Q-10 30 MG capsule Take 30 mg by mouth daily.    Marland Kitchen doxazosin (CARDURA) 4 MG tablet Take 4 mg by mouth daily.    . folic acid (FOLVITE) 800 MCG tablet Take 800 mcg by mouth daily.    . insulin glargine (LANTUS SOLOSTAR) 100 UNIT/ML injection Inject 22 Units into the skin every evening.    . loratadine-pseudoephedrine (CLARITIN-D 24-HOUR) 10-240 MG per 24 hr tablet Take 1 tablet by mouth daily as needed for allergies.    . metFORMIN (GLUCOPHAGE) 1000 MG tablet Take 1,000 mg by mouth 2 (two) times daily with a meal.    . methotrexate 2.5 MG tablet Take 15 mg by mouth once a week. Saturday    . Multiple Vitamin (MULTIVITAMIN WITH MINERALS) TABS Take 1 tablet by mouth daily.    Marland Kitchen omeprazole (PRILOSEC) 40 MG capsule Take 40 mg by mouth daily.  0  .  simvastatin (ZOCOR) 40 MG tablet Take 40 mg by mouth daily.  0   No facility-administered medications prior to visit.     PAST MEDICAL HISTORY: Past Medical History:  Diagnosis Date  . Diabetes mellitus   . Memory loss   . Sleep apnea    uses CPAP at night to sleep    PAST SURGICAL HISTORY: Past Surgical History:  Procedure Laterality Date  . HERNIA REPAIR    . REPLACEMENT TOTAL KNEE      FAMILY HISTORY: Family History  Problem Relation Age of Onset  . Liver disease Mother   . Cancer - Lung Father     SOCIAL HISTORY: Social History   Social History  . Marital status: Married    Spouse  name: betty  . Number of children: 2  . Years of education: 12th   Occupational History  . retired    Social History Main Topics  . Smoking status: Never Smoker  . Smokeless tobacco: Never Used  . Alcohol use No  . Drug use: No  . Sexual activity: Not on file   Other Topics Concern  . Not on file   Social History Narrative   Patient lives at home and drinks caffinated  Drinks daily. Married, 2 kids.  Caffeine 2-3 cups daily.    Hs Graduate.  Retired.        PHYSICAL EXAM  Vitals:   06/04/16 1256  BP: 135/74  Pulse: 62  Weight: 218 lb 9.6 oz (99.2 kg)  Height: 5' 9.5" (1.765 m)   Body mass index is 31.82 kg/m.   MMSE - Mini Mental State Exam 06/04/2016 08/20/2015 07/04/2014  Orientation to time 3 3 3   Orientation to Place 5 5 5   Registration 3 3 3   Attention/ Calculation 5 5 4   Recall 3 3 3   Language- name 2 objects 2 2 2   Language- repeat 1 0 1  Language- follow 3 step command 3 3 3   Language- read & follow direction 1 1 1   Write a sentence 1 0 1  Write a sentence-comments - incomplete sentence -  Copy design 1 0 1  Total score 28 25 27      Generalized: Well developed, in no acute distress   Neurological examination  Mentation: Alert oriented to time, place, history taking. Follows all commands speech and language fluent Cranial nerve II-XII: Pupils  were equal round reactive to light. Extraocular movements were full, visual field were full on confrontational test. Facial sensation and strength were normal. Uvula tongue midline. Head turning and shoulder shrug  were normal and symmetric. Motor: The motor testing reveals 5 over 5 strength of all 4 extremities. Good symmetric motor tone is noted throughout.  Sensory: Sensory testing is intact to soft touch on all 4 extremities. No evidence of extinction is noted.  Coordination: Cerebellar testing reveals good finger-nose-finger and heel-to-shin bilaterally.  Gait and station: Gait is slightly unsteady. Tandem gait not attempted. Romberg is negative. Reflexes: Deep tendon reflexes are symmetric and normal bilaterally.   DIAGNOSTIC DATA (LABS, IMAGING, TESTING) - I reviewed patient records, labs, notes, testing and imaging myself where available.    ASSESSMENT AND PLAN 82 y.o. year old male  has a past medical history of Diabetes mellitus; Memory loss; and Sleep apnea. here with:  1. Memory disturbance 2. Tremors   The patient was actually improving since last visit. We will continue to monitor his memory. He feels that his tremors are stable and does not wish to try any medication. Patient advised that if his symptoms worsen or he develops any new symptoms he should let us know. Follow-up in 6 months or sooner if needed.    Butch PennyMegan Heinz Eckert, MSN, NP-C 06/04/2016, 1:01 PM [facility] [address], [address] [place], [place] [address] [phone]

## 2016-06-11 ENCOUNTER — Ambulatory Visit: Payer: Medicare Other | Admitting: Podiatry

## 2016-06-17 ENCOUNTER — Ambulatory Visit: Payer: 59 | Attending: Family Medicine | Admitting: Physical Therapy

## 2016-06-17 VITALS — BP 134/79 | HR 88

## 2016-06-17 DIAGNOSIS — M6281 Muscle weakness (generalized): Secondary | ICD-10-CM | POA: Diagnosis present

## 2016-06-17 DIAGNOSIS — R2681 Unsteadiness on feet: Secondary | ICD-10-CM | POA: Insufficient documentation

## 2016-06-17 DIAGNOSIS — R2689 Other abnormalities of gait and mobility: Secondary | ICD-10-CM | POA: Diagnosis not present

## 2016-06-17 NOTE — Therapy (Signed)
[facility] [address] [place], [place], [address] Phone: [phone]   Fax:  [phone]  Physical Therapy Treatment  Patient Details  Name: [name] [name] MRN: [id number] Date of Birth: 09-Feb-1929 Referring Provider: [name], MD  Encounter Date: 06/17/2016      PT End of Session - 06/17/16 1631    Visit Number 2   Number of Visits 17   Date for PT Re-Evaluation 07/28/16   Authorization Type UHC Medicare   PT Start Time 1315   PT Stop Time 1357   PT Time Calculation (min) 42 min   Equipment Utilized During Treatment Gait belt   Activity Tolerance Patient tolerated treatment well   Behavior During Therapy [facility] for tasks assessed/performed      Past Medical History:  Diagnosis Date  . Diabetes mellitus   . Memory loss   . Sleep apnea    uses CPAP at night to sleep    Past Surgical History:  Procedure Laterality Date  . HERNIA REPAIR    . REPLACEMENT TOTAL KNEE      Vitals:   06/17/16 1322  BP: 134/79  Pulse: 88  SpO2: 95%        Subjective Assessment - 06/17/16 1322    Subjective Pt reports having fallen approx. 2 weeks ago. States, "I was walking out in the back yard, planting a tree. I fell on this (right) knee, and it's a little tender. It's getting better every day though."   Patient is accompained by: Family member  wife, [name], in waiting room   Pertinent History Likes to be called "[name]".  PMH significant for: DM, mild cognitive impairment with memory loss, dyslipidemia, sleep apnea, essential tremor, sleep apnea.   Patient Stated Goals To work on balance and walking.   Currently in Pain? Yes   Pain Score 2    Pain Location Knee   Pain Orientation Right   Pain Descriptors / Indicators Dull   Pain Type Chronic pain   Pain Onset 1 to 4 weeks ago   Pain Frequency Intermittent   Aggravating Factors  twisting   Pain Relieving Factors "nah," per pt   Multiple Pain Sites No            [facility] PT Assessment - 06/17/16 0001      6 Minute Walk- Baseline   6 Minute Walk- Baseline yes   BP (mmHg) 134/79   HR (bpm) 88   02 Sat (%RA) 95 %     6 Minute walk- Post Test   6 Minute Walk Post Test yes   BP (mmHg) 163/62   HR (bpm) 93   02 Sat (%RA) 97 %   Modified Borg Scale for Dyspnea 6-   Perceived Rate of Exertion (Borg) 12-     6 minute walk test results    Aerobic Endurance Distance Walked 912   Endurance additional comments L Trendelenburg more prominent with increased distance ambulated                          Balance Exercises - 06/17/16 1343      OTAGO PROGRAM   Head Movements Standing;5 reps  BUE support   Neck Movements Other reps (comment)  deferred due to pt difficulty performing correctly   Back Extension Standing;5 reps   Trunk Movements Standing;5 reps   Knee Flexor 10 reps   Hip ABductor 10 reps  single UE support   Ankle  Plantorflexors 20 reps, support  finger tip support   Ankle Dorsiflexors 20 reps, support  finger tip support   Knee Bends 10 reps, no support   Backwards Walking Support           PT Education - 06/17/16 1630    Education provided Yes   Education Details findings and functional implications. Initiated [employer] and walking program.    Person(s) Educated Patient   Methods Explanation;Demonstration;Verbal cues;Handout   Comprehension Verbalized understanding;Returned demonstration;Need further instruction          PT Short Term Goals - 06/17/16 1634      PT SHORT TERM GOAL #1   Title Pt will perform HEP with mod I using paper handout to maximize functional gains made in PT.  (Target: 06/26/16)   Status On-going     PT SHORT TERM GOAL #2   Title Complete and improve distance by 75' from baseline to indicate significant improvement in functional endurance.    Baseline 10/3: baseline distance = 912'   Status On-going     PT SHORT TERM GOAL #3   Title Pt will  improve Berg from 35 to to 40/56 to indicate improved standing balance.     Status On-going     PT SHORT TERM GOAL #4   Title Pt will ambulate x200' over level, indoor surfaces with mod I using LRAD to indicate increased independence with household mobility.   Status On-going     PT SHORT TERM GOAL #5   Title Pt/wife will report compliance with walking program >/= 3 days/week to maximize functional endurance.     Status On-going           PT Long Term Goals - 05/29/16 1845      PT LONG TERM GOAL #1   Title Complete and improve distance by 150' from baseline to indicate significant improvement in functional endurance.  (Target: 07/24/16)     PT LONG TERM GOAL #2   Title Pt will improve Berg from 35 to >/= 45/56 to indicate decreased fall risk.      PT LONG TERM GOAL #3   Title Pt will ambulate x500' over unlevel, paved surfaces with mod I using LRAD to indicate increased independence with community mobility.     PT LONG TERM GOAL #4   Title Pt will negotiate standard ramp and curb step with mod I using LRAD to indicate increased safety traversing community obstacles.      PT LONG TERM GOAL #5   Title Pt will transfer from supine on floor to seated on mat table with supervision using standard chair/mat table to indicate increased independence with fall recovery.               Plan - 06/17/16 1632    Clinical Impression Statement Session focused on completing , initiating walking program and [place] HEP. Pt tolerated interventions well. distance of 912' suggests significantly impaired functional endurance.    Rehab Potential Fair   Clinical Impairments Affecting Rehab Potential cognitive impairments   PT Frequency 2x / week   PT Duration 8 weeks   PT Treatment/Interventions ADLs/Self Care Home Management;Therapeutic exercise;Balance training;Gait training;Neuromuscular re-education;Stair training;Functional mobility training;Therapeutic  activities;Patient/family education;Orthotic Fit/Training   PT Next Visit Plan Ask about walking program (with wife's RW). Complete education on [place] HEP.    Consulted and Agree with Plan of Care Patient      Patient will benefit from skilled therapeutic intervention in order to  improve the following deficits and impairments:  Abnormal gait, Decreased balance, Decreased endurance, Decreased mobility, Decreased activity tolerance, Decreased strength, Impaired flexibility, Postural dysfunction, Increased edema  Visit Diagnosis: Other abnormalities of gait and mobility  Unsteadiness on feet  Muscle weakness (generalized)     Problem List Patient Active Problem List   Diagnosis Date Noted  . 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 Essential tremor 08/20/2015  . MCI (mild cognitive impairment) with memory loss 08/20/2015  . Memory loss 02/13/2015  . DM 07/19/2008  . DYSLIPIDEMIA 07/19/2008  . OBSTRUCTIVE SLEEP APNEA 07/19/2008    Jorje GuildBlair Dafina Suk, PT, DPT [facility] [address] [place], [place], [address] Phone: [phone]   Fax:  [phone] 06/17/16, 4:37 PM  Name: [name] [name] MRN: [id number] Date of Birth: 1929/07/18

## 2016-06-17 NOTE — Patient Instructions (Signed)
Walking Program:  Begin walking for exercise for 7 minutes, 1-2 times/day, 5 days/week.   Progress your walking program by adding 1 minute to your routine each week, as tolerated. Walk indoors (in your home), wear good walking shoes, use your wife's walker, and only progress to your tolerance.

## 2016-06-19 ENCOUNTER — Ambulatory Visit: Payer: 59 | Admitting: Physical Therapy

## 2016-06-19 DIAGNOSIS — R2689 Other abnormalities of gait and mobility: Secondary | ICD-10-CM | POA: Diagnosis not present

## 2016-06-19 DIAGNOSIS — M6281 Muscle weakness (generalized): Secondary | ICD-10-CM

## 2016-06-19 DIAGNOSIS — R2681 Unsteadiness on feet: Secondary | ICD-10-CM

## 2016-06-19 NOTE — Therapy (Signed)
[facility] [address] [place], [place], [address] Phone: [phone]   Fax:  [phone]  Physical Therapy Treatment  Patient Details  Name: [name] [name] MRN: [id number] Date of Birth: 1929-01-09 Referring Provider: [name], MD  Encounter Date: 06/19/2016      PT End of Session - 06/19/16 1715    Visit Number 3   Number of Visits 17   Date for PT Re-Evaluation 07/28/16   Authorization Type UHC Medicare   PT Start Time 4742   PT Stop Time 1356   PT Time Calculation (min) 39 min   Activity Tolerance Patient tolerated treatment well   Behavior During Therapy [facility] for tasks assessed/performed      Past Medical History:  Diagnosis Date  . Diabetes mellitus   . Memory loss   . Sleep apnea    uses CPAP at night to sleep    Past Surgical History:  Procedure Laterality Date  . HERNIA REPAIR    . REPLACEMENT TOTAL KNEE      There were no vitals filed for this visit.      Subjective Assessment - 06/19/16 1321    Subjective No falls, no significant changes. When asked about HEP, pt states, "I went through about all of em (exercises). They're all pretty self-explanatory."   Pertinent History Likes to be called "[name]".  PMH significant for: DM, mild cognitive impairment with memory loss, dyslipidemia, sleep apnea, essential tremor, sleep apnea.   Patient Stated Goals To work on balance and walking.   Currently in Pain? No/denies                         [facility] Adult PT Treatment/Exercise - 06/19/16 0001      Transfers   Floor to Transfer 5: Supervision   Floor to Transfer Details (indicate cue type and reason) supine <> seated on mat table with BUE support at mat table; no cueing required             Balance Exercises - 06/19/16 1322      OTAGO PROGRAM   Walking and Turning Around Assistive device  x2 reps using [facility]   Sideways Walking No assistive device  hands close  to countertop   Tandem Stance 10 seconds, support  finger tip support   Tandem Walk Support  finger tip support   One Leg Stand 10 seconds, support  intermittent UE support   Heel Walking No support  hand close to counter    Toe Walk Support  fingertip support; cueing for slower movement   Heel Toe Walking Backward --  deferred due to safety concerns   Sit to Stand 10 reps, no support   Stair Walking --  deferred due to no stairs at home           PT Education - 06/19/16 1713    Education provided Yes   Education Details Finished initial education [place]. Fall recovery/floor transfer.   Person(s) Educated Patient   Methods Explanation;Demonstration;Verbal cues;Handout   Comprehension Returned demonstration;Verbalized understanding          PT Short Term Goals - 06/17/16 1634      PT SHORT TERM GOAL #1   Title Pt will perform HEP with mod I using paper handout to maximize functional gains made in PT.  (Target: 06/26/16)   Status On-going     PT SHORT TERM GOAL #2   Title Complete 6MWT and improve  distance by 77' from baseline to indicate significant improvement in functional endurance.    Baseline 10/3: baseline 6MWT distance = 912'   Status On-going     PT SHORT TERM GOAL #3   Title Pt will improve Berg from 35 to to 40/56 to indicate improved standing balance.     Status On-going     PT SHORT TERM GOAL #4   Title Pt will ambulate x200' over level, indoor surfaces with mod I using LRAD to indicate increased independence with household mobility.   Status On-going     PT SHORT TERM GOAL #5   Title Pt/wife will report compliance with walking program >/= 3 days/week to maximize functional endurance.     Status On-going           PT Long Term Goals - 06/19/16 1716      PT LONG TERM GOAL #1   Title Complete 6MWT and improve distance by 150' from baseline to indicate significant improvement in functional endurance.  (Target: 07/24/16)   Status On-going      PT LONG TERM GOAL #2   Title Pt will improve Berg from 35 to >/= 45/56 to indicate decreased fall risk.    Status On-going     PT LONG TERM GOAL #3   Title Pt will ambulate x500' over unlevel, paved surfaces with mod I using LRAD to indicate increased independence with community mobility.   Status On-going     PT LONG TERM GOAL #4   Title Pt will negotiate standard ramp and curb step with mod I using LRAD to indicate increased safety traversing community obstacles.    Status On-going     PT LONG TERM GOAL #5   Title Pt will transfer from supine on floor to seated on mat table with supervision using standard chair/mat table to indicate increased independence with fall recovery.   Baseline Met 10/5.   Status Achieved               Plan - 06/19/16 1717    Clinical Impression Statement Session focused on completing education on [place] and on initiating education/training for fall recovery. Pt met LTG for fall recovery, as pt was able to transfer from supine on floor to sitting on mat table with supervision.    Rehab Potential Fair   Clinical Impairments Affecting Rehab Potential cognitive impairments   PT Frequency 2x / week   PT Duration 8 weeks   PT Treatment/Interventions ADLs/Self Care Home Management;Therapeutic exercise;Balance training;Gait training;Neuromuscular re-education;Stair training;Functional mobility training;Therapeutic activities;Patient/family education;Orthotic Fit/Training   PT Next Visit Plan If pt brought [facility]. Review exercises once. Continue standing balance and gait training.   Consulted and Agree with Plan of Care Patient      Patient will benefit from skilled therapeutic intervention in order to improve the following deficits and impairments:  Abnormal gait, Decreased balance, Decreased endurance, Decreased mobility, Decreased activity tolerance, Decreased strength, Impaired flexibility, Postural dysfunction, Increased edema  Visit  Diagnosis: Other abnormalities of gait and mobility  Unsteadiness on feet  Muscle weakness (generalized)     Problem List Patient Active Problem List   Diagnosis Date Noted  . 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 Essential tremor 08/20/2015  . MCI (mild cognitive impairment) with memory loss 08/20/2015  . Memory loss 02/13/2015  . DM 07/19/2008  . DYSLIPIDEMIA 07/19/2008  . OBSTRUCTIVE SLEEP APNEA 07/19/2008    Billie Ruddy, PT, DPT [facility] [address] [place], [place],  [address] Phone: [phone]   Fax:  [phone] 06/19/16, 5:19 PM  Name: Reily Ilic MRN: [id number] Date of Birth: 1929-02-20

## 2016-06-23 ENCOUNTER — Ambulatory Visit: Payer: 59 | Admitting: Physical Therapy

## 2016-06-25 ENCOUNTER — Ambulatory Visit: Payer: Medicare Other | Admitting: Podiatry

## 2016-06-26 ENCOUNTER — Ambulatory Visit: Payer: 59 | Admitting: Physical Therapy

## 2016-06-30 ENCOUNTER — Encounter: Payer: Self-pay | Admitting: Physical Therapy

## 2016-06-30 ENCOUNTER — Ambulatory Visit: Payer: 59 | Admitting: Physical Therapy

## 2016-06-30 DIAGNOSIS — R2681 Unsteadiness on feet: Secondary | ICD-10-CM

## 2016-06-30 DIAGNOSIS — M6281 Muscle weakness (generalized): Secondary | ICD-10-CM

## 2016-06-30 DIAGNOSIS — R2689 Other abnormalities of gait and mobility: Secondary | ICD-10-CM | POA: Diagnosis not present

## 2016-06-30 NOTE — Therapy (Signed)
[facility] [address] [place], [place], [address] Phone: [phone]   Fax:  [phone]  Physical Therapy Treatment  Patient Details  Name: [name] [name] MRN: [id number] Date of Birth: 02/07/1929 Referring Provider: [name], MD  Encounter Date: 06/30/2016      PT End of Session - 06/30/16 1407    Visit Number 4   Number of Visits 17   Date for PT Re-Evaluation 07/28/16   Authorization Type [employer] Medicare   PT Start Time 1404   PT Stop Time 1445   PT Time Calculation (min) 41 min   Activity Tolerance Patient tolerated treatment well   Behavior During Therapy [facility] for tasks assessed/performed      Past Medical History:  Diagnosis Date  . Diabetes mellitus   . Memory loss   . Sleep apnea    uses CPAP at night to sleep    Past Surgical History:  Procedure Laterality Date  . HERNIA REPAIR    . REPLACEMENT TOTAL KNEE      There were no vitals filed for this visit.      Subjective Assessment - 06/30/16 1406    Subjective No new compaints. No pain or falls to report. No issues with current HEP.   Pertinent History Likes to be called "[name]".  PMH significant for: DM, mild cognitive impairment with memory loss, dyslipidemia, sleep apnea, essential tremor, sleep apnea.   Patient Stated Goals To work on balance and walking.   Currently in Pain? No/denies   Pain Score 0-No pain            [facility] PT Assessment - 06/30/16 1420      Berg Balance Test   Sit to Stand Able to stand without using hands and stabilize independently   Standing Unsupported Able to stand safely 2 minutes   Sitting with Back Unsupported but Feet Supported on Floor or Stool Able to sit safely and securely 2 minutes   Stand to Sit Sits safely with minimal use of hands   Transfers Able to transfer safely, minor use of hands   Standing Unsupported with Eyes Closed Able to stand 10 seconds safely   Standing Ubsupported  with Feet Together Able to place feet together independently and stand 1 minute safely   From Standing, Reach Forward with Outstretched Arm Can reach forward >12 cm safely (5")  5 inches   From Standing Position, Pick up Object from Floor Able to pick up shoe safely and easily   From Standing Position, Turn to Look Behind Over each Shoulder Looks behind one side only/other side shows less weight shift  right > left   Turn 360 Degrees Able to turn 360 degrees safely in 4 seconds or less   Standing Unsupported, Alternately Place Feet on Step/Stool Able to stand independently and safely and complete 8 steps in 20 seconds  9.91 sec's   Standing Unsupported, One Foot in Front Able to take small step independently and hold 30 seconds   Standing on One Leg Able to lift leg independently and hold equal to or more than 3 seconds   Total Score 50           [facility] Adult PT Treatment/Exercise - 06/30/16 1420      Transfers   Transfers Sit to Stand;Stand to Sit   Sit to Stand 5: Supervision   Stand to Sit 5: Supervision     Ambulation/Gait   Ambulation/Gait Yes   Ambulation/Gait Assistance 5: Supervision  Ambulation Distance (Feet) 1000 Feet  6 minutes   Assistive device Straight cane   Gait Pattern Step-through pattern;Shuffle   Ambulation Surface Level;Indoor           PT Education - 06/30/16 1439    Education provided Yes   Education Details reissued walking programs   Person(s) Educated Patient   Methods Explanation;Demonstration;Handout   Comprehension Verbalized understanding;Returned demonstration          PT Short Term Goals - 06/30/16 1408      PT SHORT TERM GOAL #1   Title Pt will perform HEP with mod I using paper handout to maximize functional gains made in PT.  (Target: 06/26/16)   Baseline 06/30/16: pt reports no issues with OTAGO program   Status Achieved     PT SHORT TERM GOAL #2   Title Complete 6MWT and improve distance by 75' from baseline to indicate  significant improvement in functional endurance.    Baseline 06/30/16: 1000 feet today  (baseline 6MWT distance = 912 feet)   Status Achieved     PT SHORT TERM GOAL #3   Title Pt will improve Berg from 35 to to 40/56 to indicate improved standing balance.     Baseline 06/30/16: 50/56 scored today   Status Achieved     PT SHORT TERM GOAL #4   Title Pt will ambulate x200' over level, indoor surfaces with mod I using LRAD to indicate increased independence with household mobility.   Baseline 06/30/16: met today during 6 minute walk test   Status Achieved     PT SHORT TERM GOAL #5   Title Pt/wife will report compliance with walking program >/= 3 days/week to maximize functional endurance.     Baseline 06/30/16: pt has not been walking for fitness, just as needed for ADL's. Reissued walking program today.   Status Not Met           PT Long Term Goals - 06/19/16 1716      PT LONG TERM GOAL #1   Title Complete 6MWT and improve distance by 150' from baseline to indicate significant improvement in functional endurance.  (Target: 07/24/16)   Status On-going     PT LONG TERM GOAL #2   Title Pt will improve Berg from 35 to >/= 45/56 to indicate decreased fall risk.    Status On-going     PT LONG TERM GOAL #3   Title Pt will ambulate x500' over unlevel, paved surfaces with mod I using LRAD to indicate increased independence with community mobility.   Status On-going     PT LONG TERM GOAL #4   Title Pt will negotiate standard ramp and curb step with mod I using LRAD to indicate increased safety traversing community obstacles.    Status On-going     PT LONG TERM GOAL #5   Title Pt will transfer from supine on floor to seated on mat table with supervision using standard chair/mat table to indicate increased independence with fall recovery.   Baseline Met 10/5.   Status Achieved            Plan - 06/30/16 1407    Clinical Impression Statement Pt has met all but 1 STG today. Did  not meet the walking program goal as he has not been doing this at home. Reissued walking program today. Pt is making steady progress toward LTGs as well and should benefit from continued PT to progress toward unmet goals .   Rehab Potential Fair  Clinical Impairments Affecting Rehab Potential cognitive impairments   PT Frequency 2x / week   PT Duration 8 weeks   PT Treatment/Interventions ADLs/Self Care Home Management;Therapeutic exercise;Balance training;Gait training;Neuromuscular re-education;Stair training;Functional mobility training;Therapeutic activities;Patient/family education;Orthotic Fit/Training   PT Next Visit Plan . Continue standing balance and gait training toward LTGs.   Consulted and Agree with Plan of Care Patient      Patient will benefit from skilled therapeutic intervention in order to improve the following deficits and impairments:  Abnormal gait, Decreased balance, Decreased endurance, Decreased mobility, Decreased activity tolerance, Decreased strength, Impaired flexibility, Postural dysfunction, Increased edema  Visit Diagnosis: Other abnormalities of gait and mobility  Unsteadiness on feet  Muscle weakness (generalized)     Problem List Patient Active Problem List   Diagnosis Date Noted  . 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 Essential tremor 08/20/2015  . MCI (mild cognitive impairment) with memory loss 08/20/2015  . Memory loss 02/13/2015  . DM 07/19/2008  . DYSLIPIDEMIA 07/19/2008  . OBSTRUCTIVE SLEEP APNEA 07/19/2008    Willow Ora, PTA, [facility] [address] [place], [place] [address] [phone] 06/30/16, 2:52 PM   Name: [name] [name] MRN: [id number] Date of Birth: 07-02-1929

## 2016-06-30 NOTE — Patient Instructions (Signed)
Walking Program:  Begin walking for exercise for 5-6 minutes, 1 times/day, 5 days/week.   Progress your walking program by adding 2-3 minutes to your routine each week, as tolerated. Be sure to wear good walking shoes, walk in a safe environment and only progress to your tolerance.

## 2016-07-03 ENCOUNTER — Encounter: Payer: Self-pay | Admitting: Physical Therapy

## 2016-07-03 ENCOUNTER — Ambulatory Visit: Payer: 59 | Admitting: Physical Therapy

## 2016-07-03 DIAGNOSIS — R2681 Unsteadiness on feet: Secondary | ICD-10-CM

## 2016-07-03 DIAGNOSIS — R2689 Other abnormalities of gait and mobility: Secondary | ICD-10-CM | POA: Diagnosis not present

## 2016-07-03 DIAGNOSIS — M6281 Muscle weakness (generalized): Secondary | ICD-10-CM

## 2016-07-05 NOTE — Therapy (Signed)
[facility] [address] [place], [place], [address] Phone: [phone]   Fax:  [phone]  Physical Therapy Treatment  Patient Details  Name: [name] [name] MRN: [id number] Date of Birth: 1928-11-06 Referring Provider: [name], MD  Encounter Date: 07/03/2016   07/03/16 1404  PT Visits / Re-Eval  Visit Number 5  Number of Visits 17  Date for PT Re-Evaluation 07/28/16  Authorization  Authorization Type [employer] Medicare  PT Time Calculation  PT Start Time 1400  PT Stop Time 1445  PT Time Calculation (min) 45 min  PT - End of Session  Activity Tolerance Patient tolerated treatment well  Behavior During Therapy [name] [name] for tasks assessed/performed     Past Medical History:  Diagnosis Date  . Diabetes mellitus   . Memory loss   . Sleep apnea    uses CPAP at night to sleep    Past Surgical History:  Procedure Laterality Date  . HERNIA REPAIR    . REPLACEMENT TOTAL KNEE      There were no vitals filed for this visit.     07/03/16 1404  Symptoms/Limitations  Subjective No new compaints. No pain or falls to report.   Patient is accompained by: Family member (wife, [name] [name], in lobby)  Pertinent History Likes to be called "[name]".  PMH significant for: DM, mild cognitive impairment with memory loss, dyslipidemia, sleep apnea, essential tremor, sleep apnea.  Patient Stated Goals To work on balance and walking.  Pain Assessment  Currently in Pain? No/denies  Pain Score 0      07/03/16 1407  Transfers  Transfers Sit to Stand;Stand to Sit  Sit to Stand 5: Supervision;With upper extremity assist;From bed  Stand to Sit 5: Supervision;With upper extremity assist;To bed  Ambulation/Gait  Ambulation/Gait Yes  Ambulation/Gait Assistance 5: Supervision  Ambulation/Gait Assistance Details occasional cues for increased step length and for increased foot clearance with swing phase  Ambulation Distance (Feet)  500 Feet (x1)  Assistive device Straight cane  Gait Pattern Step-through pattern;Shuffle  Ambulation Surface Level;Unlevel;Indoor;Outdoor  High Level Balance  High Level Balance Activities Marching backwards;Tandem walking;Marching forwards (tandem fwd/bwd, toe walking fwd/bwd, heel walking fwd/bwd)  High Level Balance Comments at counter top with single UE support: cues on ex form and technique needed. Performed 3 laps each/each way.     07/03/16 1437  Balance Exercises: Standing  Standing Eyes Closed Narrow base of support (BOS);Foam/compliant surface;2 reps;20 secs;Limitations  Tandem Stance Eyes open;Foam/compliant surface;1 rep;20 secs;Limitations  SLS with Vectors Foam/compliant surface;Upper extremity assist 1;Other reps (comment);Limitations  Balance Exercises: Standing  SLS with Vectors Limitations on blue mat with tall cones next to counter top: with single UE support- alternating fwd toe taps x 10 each leg, alternating cross toe taps to each x 10 reps each leg, min assist for balance                        Tandem Stance Limitations on blue mat: single UE support x 1 rep each foot forward with min assist for balance  Standing Eyes Closed Limitations narrow base of support on blue mat: EC no head movements with no UE support and min guard to min assist for balance          PT Short Term Goals - 06/30/16 1408      PT SHORT TERM GOAL #1   Title Pt will perform HEP with mod I using paper handout to maximize functional gains made  in PT.  (Target: 06/26/16)   Baseline 06/30/16: pt reports no issues with OTAGO program   Status Achieved     PT SHORT TERM GOAL #2   Title Complete 6MWT and improve distance by 75' from baseline to indicate significant improvement in functional endurance.    Baseline 06/30/16: 1000 feet today  (baseline 6MWT distance = 912 feet)   Status Achieved     PT SHORT TERM GOAL #3   Title Pt will improve Berg from 35 to to 40/56 to indicate improved  standing balance.     Baseline 06/30/16: 50/56 scored today   Status Achieved     PT SHORT TERM GOAL #4   Title Pt will ambulate x200' over level, indoor surfaces with mod I using LRAD to indicate increased independence with household mobility.   Baseline 06/30/16: met today during 6 minute walk test   Status Achieved     PT SHORT TERM GOAL #5   Title Pt/wife will report compliance with walking program >/= 3 days/week to maximize functional endurance.     Baseline 06/30/16: pt has not been walking for fitness, just as needed for ADL's. Reissued walking program today.   Status Not Met           PT Long Term Goals - 06/19/16 1716      PT LONG TERM GOAL #1   Title Complete 6MWT and improve distance by 150' from baseline to indicate significant improvement in functional endurance.  (Target: 07/24/16)   Status On-going     PT LONG TERM GOAL #2   Title Pt will improve Berg from 35 to >/= 45/56 to indicate decreased fall risk.    Status On-going     PT LONG TERM GOAL #3   Title Pt will ambulate x500' over unlevel, paved surfaces with mod I using LRAD to indicate increased independence with community mobility.   Status On-going     PT LONG TERM GOAL #4   Title Pt will negotiate standard ramp and curb step with mod I using LRAD to indicate increased safety traversing community obstacles.    Status On-going     PT LONG TERM GOAL #5   Title Pt will transfer from supine on floor to seated on mat table with supervision using standard chair/mat table to indicate increased independence with fall recovery.   Baseline Met 10/5.   Status Achieved        07/03/16 1404  Plan  Clinical Impression Statement Today's skilled session continued to address activity tolerance and balance with no issues noted. Pt does continue to fatigue quickly needing short rest breaks. Pt is making steady progress toward goals and should benefit from continued PT to progress toward unmet goals.  Pt will benefit  from skilled therapeutic intervention in order to improve on the following deficits Abnormal gait;Decreased balance;Decreased endurance;Decreased mobility;Decreased activity tolerance;Decreased strength;Impaired flexibility;Postural dysfunction;Increased edema  Rehab Potential Fair  Clinical Impairments Affecting Rehab Potential cognitive impairments  PT Frequency 2x / week  PT Duration 8 weeks  PT Treatment/Interventions ADLs/Self Care Home Management;Therapeutic exercise;Balance training;Gait training;Neuromuscular re-education;Stair training;Functional mobility training;Therapeutic activities;Patient/family education;Orthotic Fit/Training  PT Next Visit Plan Continue standing balance and gait training toward LTGs.  Consulted and Agree with Plan of Care Patient        Patient will benefit from skilled therapeutic intervention in order to improve the following deficits and impairments:  Abnormal gait, Decreased balance, Decreased endurance, Decreased mobility, Decreased activity tolerance, Decreased strength, Impaired flexibility, Postural dysfunction, Increased edema  Visit Diagnosis: Other abnormalities of gait and mobility  Unsteadiness on feet  Muscle weakness (generalized)     Problem List Patient Active Problem List   Diagnosis Date Noted  . 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 Essential tremor 08/20/2015  . MCI (mild cognitive impairment) with memory loss 08/20/2015  . Memory loss 02/13/2015  . DM 07/19/2008  . DYSLIPIDEMIA 07/19/2008  . OBSTRUCTIVE SLEEP APNEA 07/19/2008    Willow Ora, PTA, [facility] [address], [place], [place] [address] [phone] 07/05/16, 8:12 PM   Name: [name] [name] MRN: [id number] Date of Birth: [birth year]

## 2016-07-07 ENCOUNTER — Ambulatory Visit: Payer: 59 | Admitting: Physical Therapy

## 2016-07-10 ENCOUNTER — Ambulatory Visit: Payer: 59 | Admitting: Physical Therapy

## 2016-07-14 ENCOUNTER — Encounter: Payer: Self-pay | Admitting: Physical Therapy

## 2016-07-14 ENCOUNTER — Ambulatory Visit: Payer: 59 | Admitting: Physical Therapy

## 2016-07-14 DIAGNOSIS — R2689 Other abnormalities of gait and mobility: Secondary | ICD-10-CM | POA: Diagnosis not present

## 2016-07-14 DIAGNOSIS — M6281 Muscle weakness (generalized): Secondary | ICD-10-CM

## 2016-07-14 DIAGNOSIS — R2681 Unsteadiness on feet: Secondary | ICD-10-CM

## 2016-07-14 NOTE — Therapy (Signed)
[facility] [address] [place], [place], [address] Phone: [phone]   Fax:  [phone]  Physical Therapy Treatment  Patient Details  Name: [name] [name] MRN: [id number] Date of Birth: [birth year] Referring Provider: [name], MD  Encounter Date: 07/14/2016      PT End of Session - 07/14/16 1505    Visit Number 6   Number of Visits 17   Date for PT Re-Evaluation 07/28/16   Authorization Type [employer] Medicare   PT Start Time 1402   PT Stop Time 1445   PT Time Calculation (min) 43 min   Activity Tolerance Patient tolerated treatment well   Behavior During Therapy [facility] for tasks assessed/performed      Past Medical History:  Diagnosis Date  . Diabetes mellitus   . Memory loss   . Sleep apnea    uses CPAP at night to sleep    Past Surgical History:  Procedure Laterality Date  . HERNIA REPAIR    . REPLACEMENT TOTAL KNEE      There were no vitals filed for this visit.      Subjective Assessment - 07/14/16 1407    Subjective Patient states he missed his last session due to pain in his R knee. He stumbled and hit his knee 3 weeks ago and feels that may have caused the pain.   Pertinent History Likes to be called "[name]".  PMH significant for: DM, mild cognitive impairment with memory loss, dyslipidemia, sleep apnea, essential tremor, sleep apnea.   Patient Stated Goals To work on balance and walking.   Currently in Pain? No/denies   Pain Score 0-No pain            [facility] PT Assessment - 07/14/16 1424      6 Minute walk- Post Test   6 Minute Walk Post Test yes   BP (mmHg) 171/81  BP 139/75 after 5 min rest   HR (bpm) 82   Modified Borg Scale for Dyspnea 3- Moderate shortness of breath or breathing difficulty   Perceived Rate of Exertion (Borg) 13- Somewhat hard     6 minute walk test results    Aerobic Endurance Distance Walked 991                     [facility] Adult PT  Treatment/Exercise - 07/14/16 0001      Transfers   Transfers Sit to Stand;Stand to Sit   Sit to Stand 6: Modified independent (Device/Increase time)   Stand to Sit 6: Modified independent (Device/Increase time)     Ambulation/Gait   Ambulation/Gait Yes   Ambulation/Gait Assistance 6: Modified independent (Device/Increase time)   Ambulation/Gait Assistance Details occasional cues to strike first with the heel during gait   Ambulation Distance (Feet) 1500 Feet  Performed while checking LTGs   Assistive device Straight cane   Gait Pattern Step-through pattern;Shuffle   Ambulation Surface Level;Unlevel;Indoor;Outdoor;Paved   Stairs --   Ramp 6: Modified independent (Device)   Curb 5: Supervision   Curb Details (indicate cue type and reason) VC to take curb straight on instead of stepping off laterally and for sequencing   Gait Comments --             Balance Exercises - 07/14/16 1707      Balance Exercises: Standing   Standing Eyes Opened Wide (BOA);Head turns;5 reps;Other (comment)  on Airex foam   Standing Eyes Closed Wide (BOA);Head turns;Foam/compliant surface;5 reps;Other (comment)  on Airex  foam             PT Short Term Goals - 07/14/16 1411      PT SHORT TERM GOAL #1   Title Pt will perform HEP with mod I using paper handout to maximize functional gains made in PT.  (Target: 06/26/16)   Baseline 06/30/16: pt reports no issues with OTAGO program   Status Achieved     PT SHORT TERM GOAL #2   Title Complete 6MWT and improve distance by 75' from baseline to indicate significant improvement in functional endurance.    Baseline 06/30/16: 1000 feet today  (baseline 6MWT distance = 912 feet)   Status Achieved     PT SHORT TERM GOAL #3   Title Pt will improve Berg from 35 to to 40/56 to indicate improved standing balance.     Baseline 06/30/16: 50/56 scored today   Status Achieved     PT SHORT TERM GOAL #4   Title Pt will ambulate x200' over level, indoor  surfaces with mod I using LRAD to indicate increased independence with household mobility.   Baseline 06/30/16: met today during 6 minute walk test   Status Achieved     PT SHORT TERM GOAL #5   Title Pt/wife will report compliance with walking program >/= 3 days/week to maximize functional endurance.     Baseline 07/14/16; achieved, pt reports he is walking 3x per week with his wife in the neighborhood   Status Achieved           PT Long Term Goals - 07/14/16 1413      PT LONG TERM GOAL #1   Title Complete 6MWT and improve distance by 150' from baseline to indicate significant improvement in functional endurance.  (Target: 07/24/16)   Baseline 07/14/16 990 feet.     Status On-going     PT LONG TERM GOAL #2   Title Pt will improve Berg from 35 to >/= 45/56 to indicate decreased fall risk.    Baseline 06/30/16 Berg score = 50/56   Status Achieved     PT LONG TERM GOAL #3   Title Pt will ambulate x500' over unlevel, paved surfaces with mod I using LRAD to indicate increased independence with community mobility.   Baseline 07/14/16 met   Status Achieved     PT LONG TERM GOAL #4   Title Pt will negotiate standard ramp and curb step with mod I using LRAD to indicate increased safety traversing community obstacles.    Baseline 07/14/16 mod I with ramp; Supervision with curb   Status Partially Met     PT LONG TERM GOAL #5   Title Pt will transfer from supine on floor to seated on mat table with supervision using standard chair/mat table to indicate increased independence with fall recovery.   Baseline Met 10/5.   Status Achieved               Plan - 07/14/16 1506    Clinical Impression Statement LTGs were assessed today in preperation for dischange on 11/02. Pt has met all STGs & LTGs 2, 3, & 5. His endurance continues to improve and he is progressing towards unmet goals.   Rehab Potential Fair   Clinical Impairments Affecting Rehab Potential cognitive impairments   PT  Frequency 2x / week   PT Duration 8 weeks   PT Treatment/Interventions ADLs/Self Care Home Management;Therapeutic exercise;Balance training;Gait training;Neuromuscular re-education;Stair training;Functional mobility training;Therapeutic activities;Patient/family education;Orthotic Fit/Training   PT Next Visit Plan Reassess Merrilee Jansky  balance test; review ascending and decending curbs using SPC. Finishing assessing goals and DC. GCODES and FOTO   Consulted and Agree with Plan of Care Patient      Patient will benefit from skilled therapeutic intervention in order to improve the following deficits and impairments:  Abnormal gait, Decreased balance, Decreased endurance, Decreased mobility, Decreased activity tolerance, Decreased strength, Impaired flexibility, Postural dysfunction, Increased edema  Visit Diagnosis: Other abnormalities of gait and mobility  Unsteadiness on feet  Muscle weakness (generalized)     Problem List Patient Active Problem List   Diagnosis Date Noted  . 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 Essential tremor 08/20/2015  . MCI (mild cognitive impairment) with memory loss 08/20/2015  . Memory loss 02/13/2015  . DM 07/19/2008  . DYSLIPIDEMIA 07/19/2008  . OBSTRUCTIVE SLEEP APNEA 07/19/2008    Benjiman Core, SPTA 07/14/2016, 5:09 PM  [facility] [address] [place], [place], [address] Phone: [phone]   Fax:  [phone]   Edited by Billie Ruddy, PT, [facility] [address] [place], [place], [address] Phone: [phone]   Fax:  [phone] 07/14/16, 5:09 PM   Name: [name] [name] MRN: [id number] Date of Birth: 11-01-1928

## 2016-07-17 ENCOUNTER — Ambulatory Visit: Payer: 59 | Attending: Family Medicine | Admitting: Physical Therapy

## 2016-07-17 DIAGNOSIS — R2681 Unsteadiness on feet: Secondary | ICD-10-CM | POA: Diagnosis present

## 2016-07-17 DIAGNOSIS — R2689 Other abnormalities of gait and mobility: Secondary | ICD-10-CM

## 2016-07-17 DIAGNOSIS — M6281 Muscle weakness (generalized): Secondary | ICD-10-CM | POA: Insufficient documentation

## 2016-07-17 NOTE — Therapy (Signed)
[facility] [address] [place], [place], [address] Phone: [phone]   Fax:  [phone]  Physical Therapy Treatment and Discharge Plan  Patient Details  Name: [name] [name] MRN: [id number] Date of Birth: 1929/06/22 Referring Provider: [name], MD  Encounter Date: 07/17/2016      PT End of Session - 07/17/16 2138    Visit Number 7   Number of Visits 17   Date for PT Re-Evaluation 07/28/16   Authorization Type [employer]   PT Start Time 6378   PT Stop Time 1442  Session ended early due to all goals met, patient discharged   PT Time Calculation (min) 38 min   Activity Tolerance Patient tolerated treatment well   Behavior During Therapy [facility] for tasks assessed/performed      Past Medical History:  Diagnosis Date  . Diabetes mellitus   . Memory loss   . Sleep apnea    uses CPAP at night to sleep    Past Surgical History:  Procedure Laterality Date  . HERNIA REPAIR    . REPLACEMENT TOTAL KNEE      There were no vitals filed for this visit.      Subjective Assessment - 07/17/16 1406    Subjective Pt reports no significant changes, no falls. Reports he feels ready for DC from PT today.   Pertinent History Likes to be called "[name]".  PMH significant for: DM, mild cognitive impairment with memory loss, dyslipidemia, sleep apnea, essential tremor, sleep apnea.   Patient Stated Goals To work on balance and walking.   Currently in Pain? No/denies            [facility] PT Assessment - 07/17/16 0001      6 Minute walk- Post Test   BP (mmHg) 161/74   HR (bpm) 85   Modified Borg Scale for Dyspnea 4- somewhat severe   Perceived Rate of Exertion (Borg) 13- Somewhat hard     6 minute walk test results    Aerobic Endurance Distance Walked 934     Berg Balance Test   Sit to Stand Able to stand without using hands and stabilize independently   Standing Unsupported Able to stand safely 2 minutes    Sitting with Back Unsupported but Feet Supported on Floor or Stool Able to sit safely and securely 2 minutes   Stand to Sit Sits safely with minimal use of hands   Transfers Able to transfer safely, minor use of hands   Standing Unsupported with Eyes Closed Able to stand 10 seconds safely   Standing Ubsupported with Feet Together Able to place feet together independently and stand 1 minute safely   From Standing, Reach Forward with Outstretched Arm Can reach confidently >25 cm (10")   From Standing Position, Pick up Object from Floor Able to pick up shoe safely and easily   From Standing Position, Turn to Look Behind Over each Shoulder Looks behind from both sides and weight shifts well   Turn 360 Degrees Able to turn 360 degrees safely in 4 seconds or less   Standing Unsupported, Alternately Place Feet on Step/Stool Able to stand independently and safely and complete 8 steps in 20 seconds   Standing Unsupported, One Foot in Front Able to plae foot ahead of the other independently and hold 30 seconds   Standing on One Leg Tries to lift leg/unable to hold 3 seconds but remains standing independently   Total Score 52  [facility] PT Treatment/Exercise - 07/17/16 0001      Ambulation/Gait   Ambulation/Gait Yes   Ambulation/Gait Assistance 6: Modified independent (Device/Increase time)   Ambulation Distance (Feet) 1100 Feet   Assistive device Straight cane   Gait Pattern Step-through pattern;Shuffle;Decreased stride length   Ambulation Surface Unlevel;Outdoor;Paved   Ramp 6: Modified independent (Device)  using SPC   Curb 6: Modified independent (Device/increase time)  using [facility]                PT Education - 07/17/16 2135    Education provided Yes   Education Details PT goals, findings, progress, and DC plan.    Person(s) Educated Patient   Methods Explanation   Comprehension Verbalized understanding          PT Short Term Goals -  07/14/16 1411      PT SHORT TERM GOAL #1   Title Pt will perform HEP with mod I using paper handout to maximize functional gains made in PT.  (Target: 06/26/16)   Baseline 06/30/16: pt reports no issues with OTAGO program   Status Achieved     PT SHORT TERM GOAL #2   Title Complete 6MWT and improve distance by 75' from baseline to indicate significant improvement in functional endurance.    Baseline 06/30/16: 1000 feet today  (baseline 6MWT distance = 912 feet)   Status Achieved     PT SHORT TERM GOAL #3   Title Pt will improve Berg from 35 to to 40/56 to indicate improved standing balance.     Baseline 06/30/16: 50/56 scored today   Status Achieved     PT SHORT TERM GOAL #4   Title Pt will ambulate x200' over level, indoor surfaces with mod I using LRAD to indicate increased independence with household mobility.   Baseline 06/30/16: met today during 6 minute walk test   Status Achieved     PT SHORT TERM GOAL #5   Title Pt/wife will report compliance with walking program >/= 3 days/week to maximize functional endurance.     Baseline 07/14/16; achieved, pt reports he is walking 3x per week with his wife in the neighborhood   Status Achieved           PT Long Term Goals - 07/17/16 2141      PT LONG TERM GOAL #1   Title Complete 6MWT and improve distance by 150' from baseline to indicate significant improvement in functional endurance.  (Target: 07/24/16)   Baseline Baseline distance = 912'.   07/14/16: 6MWT distance = 990 feet.     Status Partially Met     PT LONG TERM GOAL #2   Title Pt will improve Berg from 35 to >/= 45/56 to indicate decreased fall risk.    Baseline 06/30/16 Berg score = 50/56   Status Achieved     PT LONG TERM GOAL #3   Title Pt will ambulate x500' over unlevel, paved surfaces with mod I using LRAD to indicate increased independence with community mobility.   Baseline 07/14/16 met   Status Achieved     PT LONG TERM GOAL #4   Title Pt will negotiate  standard ramp and curb step with mod I using LRAD to indicate increased safety traversing community obstacles.    Baseline Met 11/2.   Status Achieved     PT LONG TERM GOAL #5   Title Pt will transfer from supine on floor to seated on mat table with supervision using standard chair/mat table to indicate  increased independence with fall recovery.   Baseline Met 10/5.   Status Achieved               Plan - 2016-08-12 2142    Clinical Impression Statement Pt has met or partially met 5/5 LTG's and exhibits significant improvement in stability/independence iwth functional mobility. Pt will therefore be discharged from outpatient PT at this time. Pt verbalized understanding and was in full agreement with DC plan.    Consulted and Agree with Plan of Care Patient      Patient will benefit from skilled therapeutic intervention in order to improve the following deficits and impairments:     Visit Diagnosis: Other abnormalities of gait and mobility  Unsteadiness on feet  Muscle weakness (generalized)       G-Codes - 08/12/16 1428    Functional Assessment Tool Used Merrilee Jansky = 52/56   Functional Limitation Mobility: Walking and moving around   Mobility: Walking and Moving Around Goal Status [phone]) At least 1 percent but less than 20 percent impaired, limited or restricted   Mobility: Walking and Moving Around Discharge Status [phone]) At least 1 percent but less than 20 percent impaired, limited or restricted      Problem List Patient Active Problem List   Diagnosis Date Noted  . 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 Essential tremor 08/20/2015  . MCI (mild cognitive impairment) with memory loss 08/20/2015  . Memory loss 02/13/2015  . DM 07/19/2008  . DYSLIPIDEMIA 07/19/2008  . OBSTRUCTIVE SLEEP APNEA 07/19/2008   PHYSICAL THERAPY DISCHARGE SUMMARY  Visits from Start of Care: 7  Current functional level related to goals / functional outcomes: See above.   Remaining deficits: See  above.   Education / Equipment: See above.  Plan: Patient agrees to discharge.  Patient goals were met. Patient is being discharged due to meeting the stated rehab goals.  ?????      Name: Teryn Gust MRN: [id number] Date of Birth: August 30, 1929   Billie Ruddy, PT, [place] [address] [place], [place], [address] Phone: [phone]   Fax:  [phone] 08/12/2016, 9:44 PM

## 2016-08-04 ENCOUNTER — Other Ambulatory Visit: Payer: Self-pay | Admitting: Neurology

## 2016-08-04 DIAGNOSIS — G3184 Mild cognitive impairment, so stated: Secondary | ICD-10-CM

## 2016-08-20 ENCOUNTER — Ambulatory Visit: Payer: Medicare Other | Admitting: Podiatry

## 2016-08-27 ENCOUNTER — Encounter: Payer: Self-pay | Admitting: Podiatry

## 2016-08-27 ENCOUNTER — Ambulatory Visit (INDEPENDENT_AMBULATORY_CARE_PROVIDER_SITE_OTHER): Payer: Medicare Other | Admitting: Podiatry

## 2016-08-27 VITALS — BP 103/73 | HR 75 | Resp 18

## 2016-08-27 DIAGNOSIS — M79675 Pain in left toe(s): Secondary | ICD-10-CM

## 2016-08-27 DIAGNOSIS — M79674 Pain in right toe(s): Secondary | ICD-10-CM | POA: Diagnosis not present

## 2016-08-27 DIAGNOSIS — B351 Tinea unguium: Secondary | ICD-10-CM | POA: Diagnosis not present

## 2016-08-27 NOTE — Patient Instructions (Signed)

## 2016-08-28 NOTE — Progress Notes (Signed)
Patient ID: [name] [name], male   DOB: Jun 29, 1929, 82 y.o.   MRN: [id number]    Subjective: This patient presents for scheduled visit complaining of painful toenails and walking wearing shoes and requests nail debridement, with wife present treatment room  Objective: Orientated 3 DP and PT pulses 2/4 bilaterally Capillary reflex delay bilaterally Sensation to 10 g monofilament wire intact 4/5 right 5/left Vibratory sensation reactive bilaterally Ankle reflex equal and reactive bilaterally HAV bilaterally No open skin lesions bilaterally Dry scaling skin bilaterally The toenails are brittle, elongated, incurvated, hypertrophic and tender to direct palpation 6-10  Assessment: Symptomatic onychomycoses 6-10 Diabetic with satisfactory neurovascular status  Plan: Debrided toenails 10 and can mechanically and electrically without any bleeding  Reappoint 3 months

## 2016-10-22 ENCOUNTER — Other Ambulatory Visit: Payer: Self-pay | Admitting: Neurology

## 2016-10-22 DIAGNOSIS — G3184 Mild cognitive impairment, so stated: Secondary | ICD-10-CM

## 2016-10-24 ENCOUNTER — Encounter: Payer: Self-pay | Admitting: Gastroenterology

## 2016-10-29 ENCOUNTER — Ambulatory Visit: Payer: Medicare Other | Admitting: Gastroenterology

## 2016-10-31 ENCOUNTER — Ambulatory Visit (INDEPENDENT_AMBULATORY_CARE_PROVIDER_SITE_OTHER): Payer: Medicare Other | Admitting: Gastroenterology

## 2016-10-31 ENCOUNTER — Encounter: Payer: Self-pay | Admitting: Gastroenterology

## 2016-10-31 VITALS — BP 130/70 | HR 82 | Ht 69.0 in | Wt 219.0 lb

## 2016-10-31 DIAGNOSIS — K429 Umbilical hernia without obstruction or gangrene: Secondary | ICD-10-CM

## 2016-10-31 DIAGNOSIS — R194 Change in bowel habit: Secondary | ICD-10-CM

## 2016-10-31 NOTE — Patient Instructions (Signed)
We will obtain your most recent labs from Dr Merita NortonSwayne's office  [phone]  Start probiotic such as Landlorastor, IT sales professionalAlign or Culturelle   Use Imodium as needed

## 2016-11-06 ENCOUNTER — Encounter: Payer: Self-pay | Admitting: Gastroenterology

## 2016-11-06 DIAGNOSIS — K429 Umbilical hernia without obstruction or gangrene: Secondary | ICD-10-CM | POA: Insufficient documentation

## 2016-11-06 DIAGNOSIS — R194 Change in bowel habit: Secondary | ICD-10-CM

## 2016-11-06 HISTORY — DX: Umbilical hernia without obstruction or gangrene: K42.9

## 2016-11-06 HISTORY — DX: Change in bowel habit: R19.4

## 2016-11-06 NOTE — Progress Notes (Signed)
I agree with the above note, plan 

## 2016-11-06 NOTE — Progress Notes (Signed)
10/31/2016 [name] [name] [id number] 12/30/1928   HISTORY OF PRESENT ILLNESS:This is a pleasant 83 year old male who is previously known to Dr. Corinda Gubler for colonoscopy in May 2007 at which time he was found to have only diverticulosis. He presents to our office today with his wife for complaints of a "knot" at his belly button.  He says that this appeared a few weeks ago.  Is not painful.    While they are here his wife also mentions that he has more frequent stools than he used to.  He says that occasionally they are loose when he eats a lot of fruits but otherwise he just has a BM a little more often than he always had in the past.  He does not appear to find this as an issue/does not seem concerned with it.  His wife says that sometimes he has to get up during a meal to go have a BM.  He denies abdominal pain or blood in his stools.  He is IDDM and is on metformin for several years.     Past Medical History:  Diagnosis Date  . Anxiety   . Dementia   . Diabetes mellitus   . Hyperlipidemia   . Obesity   . Sleep apnea    uses CPAP at night to sleep   Past Surgical History:  Procedure Laterality Date  . HERNIA REPAIR Right   . REPLACEMENT TOTAL KNEE Left    Dr Bethann Goo / Dr Fannie Knee  . TRANSURETHRAL RESECTION OF PROSTATE      reports that he has never smoked. He has never used smokeless tobacco. He reports that he does not drink alcohol or use drugs. family history includes Cancer - Lung in his father; Liver disease in his mother. Allergies  Allergen Reactions  . Donepezil Swelling  . Namenda [Memantine Hcl] Swelling  . Robaxin [Methocarbamol] Shortness Of Breath and Swelling  . Lisinopril Swelling      Outpatient Encounter Prescriptions as of 10/31/2016  Medication Sig  . B-D ULTRAFINE III SHORT PEN 31G X 8 MM MISC   . Cholecalciferol (VITAMIN D-3 PO) 1000 IU tablets, takes 3 daily  . citalopram (CELEXA) 20 MG tablet TAKE 1 TABLET (20 MG TOTAL) BY MOUTH DAILY.  Marland Kitchen  co-enzyme Q-10 30 MG capsule Take 30 mg by mouth daily.  Marland Kitchen doxazosin (CARDURA) 4 MG tablet Take 4 mg by mouth daily.  . folic acid (FOLVITE) 800 MCG tablet Take 800 mcg by mouth daily.  . insulin glargine (LANTUS SOLOSTAR) 100 UNIT/ML injection Inject 26 Units into the skin every evening.   . loratadine-pseudoephedrine (CLARITIN-D 24-HOUR) 10-240 MG per 24 hr tablet Take 1 tablet by mouth daily as needed for allergies.  . metFORMIN (GLUCOPHAGE) 1000 MG tablet Take 1,000 mg by mouth 2 (two) times daily with a meal.  . methotrexate 2.5 MG tablet Take 15 mg by mouth once a week. sunday  . Multiple Vitamin (MULTIVITAMIN WITH MINERALS) TABS Take 1 tablet by mouth daily.  Marland Kitchen omeprazole (PRILOSEC) 40 MG capsule Take 40 mg by mouth daily.  . simvastatin (ZOCOR) 40 MG tablet Take 40 mg by mouth daily.   No facility-administered encounter medications on file as of 10/31/2016.      REVIEW OF SYSTEMS  : All other systems reviewed and negative except where noted in the History of Present Illness.   PHYSICAL EXAM: BP 130/70   Pulse 82   Ht 5\' 9"  (1.753 m)   Wt 219 lb (99.3  kg)   BMI 32.34 kg/m  General: Well developed white male in no acute distress Head: Normocephalic and atraumatic Eyes:  Sclerae anicteric, conjunctiva pink. Ears: Normal auditory acuity Lungs: Clear throughout to auscultation; no increase WOB. Heart: Regular rate and rhythm Abdomen: Soft, non-distended. Normal bowel sounds.  Non-tender.  Small umbilical hernia noted, easily reducible and non-tender. Musculoskeletal: Symmetrical with no gross deformities  Skin: No lesions on visible extremities Extremities: No edema  Neurological: Alert oriented x 4, grossly non-focal Psychological:  Alert and cooperative. Normal mood and affect  ASSESSMENT AND PLAN: -Umbilical hernia:  Small, non-tender, easily reducible.  Asymptomatic.  I have advised that the only solution for this is surgical repair, which he is not interested in and I  would not advise that since it is asymptomatic.  Continue observation. -Change in bowel habits with loose stools:  Denies diarrhea.  Says that he just has more frequent BM's than he used to, which he thinks is also brought on by certain foods like fruit.  He is not interested in colonoscopy at his age.  May certainly be dietary but could also be side effect of metformin and possible some SIBO related to his IDDM.  He will start a daily probiotic such as Florastor, Culturelle, or Align.  If symptoms become more bothersome to him then we could consider a trial of Xifaxan, but at this point it seems that his symptoms are more bothersome to his wife than to him. He can also use Imodium as needed.  CC:  [name], David, MD

## 2016-11-26 ENCOUNTER — Ambulatory Visit: Payer: Medicare Other | Admitting: Podiatry

## 2016-12-03 ENCOUNTER — Ambulatory Visit: Payer: Medicare Other | Admitting: Neurology

## 2016-12-23 ENCOUNTER — Ambulatory Visit (INDEPENDENT_AMBULATORY_CARE_PROVIDER_SITE_OTHER): Payer: Medicare Other | Admitting: Podiatry

## 2016-12-23 ENCOUNTER — Encounter: Payer: Self-pay | Admitting: Podiatry

## 2016-12-23 DIAGNOSIS — B351 Tinea unguium: Secondary | ICD-10-CM | POA: Diagnosis not present

## 2016-12-23 DIAGNOSIS — M79675 Pain in left toe(s): Secondary | ICD-10-CM

## 2016-12-23 DIAGNOSIS — M79674 Pain in right toe(s): Secondary | ICD-10-CM

## 2016-12-23 NOTE — Progress Notes (Signed)
Complaint:  Visit Type: Patient returns to my office for continued preventative foot care services. Complaint: Patient states" my nails have grown long and thick and become painful to walk and wear shoes"  The patient presents for preventative foot care services. No changes to ROS  Podiatric Exam: Vascular: dorsalis pedis and posterior tibial pulses are palpable bilateral. Capillary return is immediate. Temperature gradient is WNL. Skin turgor WNL  Sensorium: Normal Semmes Weinstein monofilament test. Normal tactile sensation bilaterally. Nail Exam: Pt has thick disfigured discolored nails with subungual debris noted bilateral entire nail hallux through fifth toenails Ulcer Exam: There is no evidence of ulcer or pre-ulcerative changes or infection. Orthopedic Exam: Muscle tone and strength are WNL. No limitations in general ROM. No crepitus or effusions noted. HAV  B/L. Skin: No Porokeratosis. No infection or ulcers  Diagnosis:  Onychomycosis, , Pain in right toe, pain in left toes  Treatment & Plan Procedures and Treatment: Consent by patient was obtained for treatment procedures. The patient understood the discussion of treatment and procedures well. All questions were answered thoroughly reviewed. Debridement of mycotic and hypertrophic toenails, 1 through 5 bilateral and clearing of subungual debris. No ulceration, no infection noted.  Return Visit-Office Procedure: Patient instructed to return to the office for a follow up visit 3 months for continued evaluation and treatment.    Antoria Lanza DPM 

## 2017-03-09 ENCOUNTER — Encounter: Payer: Self-pay | Admitting: Neurology

## 2017-03-09 ENCOUNTER — Ambulatory Visit (INDEPENDENT_AMBULATORY_CARE_PROVIDER_SITE_OTHER): Payer: Medicare Other | Admitting: Neurology

## 2017-03-09 VITALS — BP 110/61 | HR 76 | Ht 69.0 in | Wt 217.5 lb

## 2017-03-09 DIAGNOSIS — G3184 Mild cognitive impairment, so stated: Secondary | ICD-10-CM | POA: Diagnosis not present

## 2017-03-09 DIAGNOSIS — R2681 Unsteadiness on feet: Secondary | ICD-10-CM | POA: Diagnosis not present

## 2017-03-09 NOTE — Progress Notes (Signed)
PATIENT: [name] [name] [name] DOB: 09-Oct-1928  REASON FOR VISIT: follow up- cognitive impairment, tremors HISTORY FROM: patient  HISTORY OF PRESENT ILLNESS:  I have the pleasure of seeing [name] [name] [name] today, meanwhile 83 years old, in the presence of his wife. He is followed for mild memory loss and did extremely well on a Mini-Mental Status Examination today, scoring 28 out of 30 points. He endorsed a couple of depression related symptoms 6/15 he did very well with visual spatial parts of his Mini-Mental test. [name] [name] [name] feels that his day to day memory is doing very well, but his wife feels that he has trouble with directions and misplacing items. His short term memory is affected. He is physically weak , needs help dressing, reminders of meds and appointments.  His wife sees this as a decline in his memory. The couple lives alone, in a private home, has a gardener and cleaner.     [name] [name] [name] is an 83 year old male with a history of cognitive impairment and tremors. He returns today for evaluation. The patient has been unable to tolerate Aricept and Namenda due to being "allergic." He feels that his memory has remained the same. He does operate a motor vehicle however his wife is always with him. He is able to complete all ADLs independently. He lives at home with his wife. He reports that his tremor has remained stable. It primarily affects the hands. In the past he's been encouraged to try a weighted utensils but he has not tried this. He states that he sleeps well at night. He is currently using the CPAP that is managed by Dr. Earl [name]. He returns today for an evaluation.  HISTORY 08/20/15: [name] [name] [name] is now 83 years old, and comes in for follow up after 6 month with his wife, [name] [name] [name].  He had lip, tongue and throat swelling with Namenda and Aricept.  His short term memory is impaired but his attention/calculation and visuo-spatial perception is not affected. He scored today 25 out of  30 possible points on MMSE, compared to 27 one year ago . Since retirement he ha lost more often track of the date and day of the week. [name] [name] [name] underwent a Mini-Mental Status Examination today again he was not quite sure about the day of the week but he could name the season the year and the month. He was fully oriented to place could perform a serial 7 and recall 3 out of 3 words. He had some trouble with repetition and is writing a sentence or copy a design. It seems that there is more visual spatial difficulties also the clock face had its hands placed correctly. His animal fluency test was 13.  The patient also brought me a short list of current symptoms #1 he has tremors sometimes making it difficult for him to even feed himself. He feels overall weaker, he has more forgetfulness and confusion. And his wife has noticed that at night while sleeping he has body jerks or myoclonic jerks. Sometimes there is so violent that she gets woken up and the bed was shaking. He seems not to be quite aware of the intensity. She is also suspecting some depression symptoms.   04/27/12 PRIOR HPI (CD): I have seen" [name] [name]" [name] [name] [name] many years ago for memory loss and he is now here , I believe over 5 years later. His wife is my patient as well. MMSE and MOCA were planned for today , and medication therapy to be initiated or revised.  He has been known to take his medication wrong , forgets or doubles it at times, and ended up in hospital . His wife reported he lost 3 mobile phones in a couple of month. He is still active , they takes week end trips together and her does much of the shopping. His wife indicates that he is making excuses for memory lapses, and she is concerned. He took a double dose of Lantus insulin and she remarked he is short tempered when corrected or questioned, had some improvement on an SSRI. He had to discontinue Aricept due to side effects and would like to see what else is  meanwhile on the market. He physicaly doing well. He has some RLS, he has leg jumping and jerking, myoclonus.    REVIEW OF SYSTEMS: Out of a complete 14 system review of symptoms, the patient complains only of the following symptoms, and all other reviewed systems are negative.  Joint pain, walking difficulty, moles, confusion, depression, tremors, dizziness, cough, wheezing, shortness breath, leg swelling, restless leg, snoring, sleep talking, fatigue  ALLERGIES: Allergies  Allergen Reactions  . Donepezil Swelling  . Namenda [Memantine Hcl] Swelling  . Robaxin [Methocarbamol] Shortness Of Breath and Swelling  . Lisinopril Swelling    HOME MEDICATIONS: Outpatient Medications Prior to Visit  Medication Sig Dispense Refill  . B-D ULTRAFINE III SHORT PEN 31G X 8 MM MISC   1  . Cholecalciferol (VITAMIN D-3 PO) 1000 IU tablets, takes 3 daily    . citalopram (CELEXA) 20 MG tablet TAKE 1 TABLET (20 MG TOTAL) BY MOUTH DAILY. 90 tablet 3  . co-enzyme Q-10 30 MG capsule Take 30 mg by mouth daily.    Marland Kitchen doxazosin (CARDURA) 4 MG tablet Take 4 mg by mouth daily.    . folic acid (FOLVITE) 800 MCG tablet Take 800 mcg by mouth daily.    . insulin glargine (LANTUS SOLOSTAR) 100 UNIT/ML injection Inject 26 Units into the skin every evening.     . loratadine-pseudoephedrine (CLARITIN-D 24-HOUR) 10-240 MG per 24 hr tablet Take 1 tablet by mouth daily as needed for allergies.    . metFORMIN (GLUCOPHAGE) 1000 MG tablet Take 1,000 mg by mouth 2 (two) times daily with a meal.    . methotrexate 2.5 MG tablet Take 15 mg by mouth once a week. sunday    . Multiple Vitamin (MULTIVITAMIN WITH MINERALS) TABS Take 1 tablet by mouth daily.    Marland Kitchen omeprazole (PRILOSEC) 40 MG capsule Take 40 mg by mouth daily.  0  . simvastatin (ZOCOR) 40 MG tablet Take 40 mg by mouth daily.  0   No facility-administered medications prior to visit.     PAST MEDICAL HISTORY: Past Medical History:  Diagnosis Date  . Anxiety   .  Dementia   . Diabetes mellitus   . Hyperlipidemia   . Obesity   . Sleep apnea    uses CPAP at night to sleep    PAST SURGICAL HISTORY: Past Surgical History:  Procedure Laterality Date  . HERNIA REPAIR Right   . REPLACEMENT TOTAL KNEE Left    Dr Bethann Goo / Dr Fannie Knee  . TRANSURETHRAL RESECTION OF PROSTATE      FAMILY HISTORY: Family History  Problem Relation Age of Onset  . Liver disease Mother   . Cancer - Lung Father        smoker    SOCIAL HISTORY: Social History   Social History  . Marital status: Married    Spouse name: [name] [name] [name]  .  Number of children: 2  . Years of education: 12th   Occupational History  . retired    Social History Main Topics  . Smoking status: Never Smoker  . Smokeless tobacco: Never Used  . Alcohol use No  . Drug use: No  . Sexual activity: Not on file   Other Topics Concern  . Not on file   Social History Narrative   Patient lives at home and drinks caffinated  Drinks daily. Married, 2 kids.  Caffeine 2-3 cups daily.    Hs Graduate.  Retired.        PHYSICAL EXAM  Vitals:   03/09/17 1323  BP: 110/61  Pulse: 76  Weight: 217 lb 8 oz (98.7 kg)  Height: 5\' 9"  (1.753 m)   Body mass index is 32.12 kg/m.   MMSE - Mini Mental State Exam 03/09/2017 06/04/2016 08/20/2015  Orientation to time 4 3 3   Orientation to Place 5 5 5   Registration 3 3 3   Attention/ Calculation 5 5 5   Recall 2 3 3   Language- name 2 objects 2 2 2   Language- repeat 1 1 0  Language- follow 3 step command 3 3 3   Language- read & follow direction 1 1 1   Write a sentence 1 1 0  Write a sentence-comments - - incomplete sentence  Copy design 1 1 0  Total score 28 28 25      Generalized: Well developed, in no acute distress   Neurological examination  Mentation: Alert oriented to time, place, history taking. Follows all commands speech and language fluent Cranial nerve: taste and smell [preserved. upils were equal round reactive to light. Extraocular movements  were full, visual field were full on confrontational test. Facial sensation and strength were normal. Uvula tongue midline. Head turning and shoulder shrug  were normal and symmetric. Motor: full symmetric motor tone and strength noted throughout. He falls frequently - his gait has small steps, shuffles but is not parkinsonian. Gait and station: Gait is slightly unsteady. Tandem gait not attempted. Romberg is negative. Coordination:intact  finger-nose-finger bilaterally.  Reflexes: Deep tendon reflexes are symmetric bilaterally.   DIAGNOSTIC DATA (LABS, IMAGING, TESTING) - I reviewed patient records, labs, notes, testing and imaging myself where available.  Patient gave up driving.   ASSESSMENT AND PLAN 83 y.o. year old male  has a past medical history of Anxiety; Dementia; Diabetes mellitus; Hyperlipidemia; Obesity; and Sleep apnea. here with:  1. Memory disturbance, MMSE 28-30 !  2. Tremors, spasms and jerking during the night. Myoclonus. Wakes his wife but stays asleep.   We will continue to monitor his memory. He feels that his tremors are stable and does not wish to try any medication. Patient advised that if his symptoms worsen or he develops any new symptoms he should let us know.  Follow-up in 6 months or sooner if needed.   Melvyn Novasarmen Tatsuya Okray, MD  03/09/2017, 1:55 PM [facility] [address], [address] [place], [place] [address] [phone]

## 2017-03-24 ENCOUNTER — Ambulatory Visit (INDEPENDENT_AMBULATORY_CARE_PROVIDER_SITE_OTHER): Payer: Medicare Other | Admitting: Podiatry

## 2017-03-24 ENCOUNTER — Encounter: Payer: Self-pay | Admitting: Podiatry

## 2017-03-24 DIAGNOSIS — B351 Tinea unguium: Secondary | ICD-10-CM | POA: Diagnosis not present

## 2017-03-24 DIAGNOSIS — E119 Type 2 diabetes mellitus without complications: Secondary | ICD-10-CM | POA: Diagnosis not present

## 2017-03-24 DIAGNOSIS — M79674 Pain in right toe(s): Secondary | ICD-10-CM

## 2017-03-24 DIAGNOSIS — M79675 Pain in left toe(s): Secondary | ICD-10-CM

## 2017-03-24 NOTE — Patient Instructions (Signed)

## 2017-03-24 NOTE — Progress Notes (Signed)
Patient ID: [name] [name], male   DOB: May 27, 1929, 83 y.o.   MRN: [id number]    Subjective: This patient presents for scheduled visit complaining of painful toenails and walking wearing shoes and requests nail debridement, with wife present treatment room  Objective: Orientated 3 DP and PT pulses 2/4 bilaterally Capillary reflex delay bilaterally Sensation to 10 g monofilament wire intact 4/5 right 5/left Vibratory sensation reactive bilaterally Ankle reflex equal and reactive bilaterally HAV bilaterally No open skin lesions bilaterally Dry scaling skin bilaterally The toenails are brittle, elongated, incurvated, hypertrophic and tender to direct palpation 6-10  Assessment: Symptomatic onychomycoses 6-10 Diabetic with satisfactory neurovascular status  Plan: Debrided toenails 10 and can mechanically and electrically without any bleeding  Reappoint 3 months

## 2017-04-04 ENCOUNTER — Inpatient Hospital Stay (HOSPITAL_COMMUNITY)
Admission: EM | Admit: 2017-04-04 | Discharge: 2017-04-06 | DRG: 064 | Disposition: A | Discharge status: Home Health | Payer: Medicare Other | Attending: Internal Medicine | Admitting: Internal Medicine

## 2017-04-04 ENCOUNTER — Emergency Department (HOSPITAL_COMMUNITY): Payer: Medicare Other

## 2017-04-04 ENCOUNTER — Encounter (HOSPITAL_COMMUNITY): Payer: Self-pay

## 2017-04-04 ENCOUNTER — Inpatient Hospital Stay (HOSPITAL_COMMUNITY): Payer: Medicare Other

## 2017-04-04 ENCOUNTER — Emergency Department (HOSPITAL_COMMUNITY)
Admit: 2017-04-04 | Discharge: 2017-04-04 | Disposition: A | Discharge status: Home / Self Care | Payer: Medicare Other | Attending: Emergency Medicine | Admitting: Emergency Medicine

## 2017-04-04 DIAGNOSIS — I639 Cerebral infarction, unspecified: Secondary | ICD-10-CM

## 2017-04-04 DIAGNOSIS — J189 Pneumonia, unspecified organism: Secondary | ICD-10-CM

## 2017-04-04 DIAGNOSIS — Z79899 Other long term (current) drug therapy: Secondary | ICD-10-CM

## 2017-04-04 DIAGNOSIS — F0390 Unspecified dementia without behavioral disturbance: Secondary | ICD-10-CM | POA: Diagnosis present

## 2017-04-04 DIAGNOSIS — Z7984 Long term (current) use of oral hypoglycemic drugs: Secondary | ICD-10-CM | POA: Diagnosis not present

## 2017-04-04 DIAGNOSIS — R410 Disorientation, unspecified: Secondary | ICD-10-CM

## 2017-04-04 DIAGNOSIS — E119 Type 2 diabetes mellitus without complications: Secondary | ICD-10-CM

## 2017-04-04 DIAGNOSIS — E669 Obesity, unspecified: Secondary | ICD-10-CM | POA: Diagnosis present

## 2017-04-04 DIAGNOSIS — R531 Weakness: Secondary | ICD-10-CM

## 2017-04-04 DIAGNOSIS — R413 Other amnesia: Secondary | ICD-10-CM | POA: Diagnosis present

## 2017-04-04 DIAGNOSIS — R29701 NIHSS score 1: Secondary | ICD-10-CM | POA: Diagnosis present

## 2017-04-04 DIAGNOSIS — G9341 Metabolic encephalopathy: Secondary | ICD-10-CM

## 2017-04-04 DIAGNOSIS — E785 Hyperlipidemia, unspecified: Secondary | ICD-10-CM | POA: Diagnosis present

## 2017-04-04 DIAGNOSIS — Z6831 Body mass index (BMI) 31.0-31.9, adult: Secondary | ICD-10-CM | POA: Diagnosis not present

## 2017-04-04 DIAGNOSIS — J181 Lobar pneumonia, unspecified organism: Secondary | ICD-10-CM | POA: Diagnosis not present

## 2017-04-04 DIAGNOSIS — J324 Chronic pansinusitis: Secondary | ICD-10-CM | POA: Diagnosis present

## 2017-04-04 DIAGNOSIS — F0150 Vascular dementia without behavioral disturbance: Secondary | ICD-10-CM

## 2017-04-04 DIAGNOSIS — Z888 Allergy status to other drugs, medicaments and biological substances status: Secondary | ICD-10-CM

## 2017-04-04 DIAGNOSIS — G4733 Obstructive sleep apnea (adult) (pediatric): Secondary | ICD-10-CM | POA: Diagnosis present

## 2017-04-04 DIAGNOSIS — G9340 Encephalopathy, unspecified: Secondary | ICD-10-CM | POA: Diagnosis not present

## 2017-04-04 DIAGNOSIS — I6300 Cerebral infarction due to thrombosis of unspecified precerebral artery: Secondary | ICD-10-CM | POA: Diagnosis not present

## 2017-04-04 DIAGNOSIS — I361 Nonrheumatic tricuspid (valve) insufficiency: Secondary | ICD-10-CM | POA: Diagnosis not present

## 2017-04-04 DIAGNOSIS — Z96652 Presence of left artificial knee joint: Secondary | ICD-10-CM | POA: Diagnosis present

## 2017-04-04 HISTORY — DX: Pneumonia, unspecified organism: J18.9

## 2017-04-04 HISTORY — DX: Metabolic encephalopathy: G93.41

## 2017-04-04 HISTORY — DX: Type 2 diabetes mellitus without complications: E11.9

## 2017-04-04 LAB — CBC WITH DIFFERENTIAL/PLATELET
Basophils Absolute: 0 10*3/uL (ref 0.0–0.1)
Basophils Relative: 0 %
EOS ABS: 0.1 10*3/uL (ref 0.0–0.7)
Eosinophils Relative: 1 %
HEMATOCRIT: 33.1 % — AB (ref 39.0–52.0)
HEMOGLOBIN: 10.7 g/dL — AB (ref 13.0–17.0)
LYMPHS ABS: 2 10*3/uL (ref 0.7–4.0)
Lymphocytes Relative: 31 %
MCH: 31.8 pg (ref 26.0–34.0)
MCHC: 32.3 g/dL (ref 30.0–36.0)
MCV: 98.5 fL (ref 78.0–100.0)
MONO ABS: 0.5 10*3/uL (ref 0.1–1.0)
MONOS PCT: 8 %
NEUTROS ABS: 3.7 10*3/uL (ref 1.7–7.7)
Neutrophils Relative %: 60 %
Platelets: 211 10*3/uL (ref 150–400)
RBC: 3.36 MIL/uL — ABNORMAL LOW (ref 4.22–5.81)
RDW: 14.6 % (ref 11.5–15.5)
WBC: 6.2 10*3/uL (ref 4.0–10.5)

## 2017-04-04 LAB — CBC
HEMATOCRIT: 32.8 % — AB (ref 39.0–52.0)
HEMOGLOBIN: 10.7 g/dL — AB (ref 13.0–17.0)
MCH: 32.1 pg (ref 26.0–34.0)
MCHC: 32.6 g/dL (ref 30.0–36.0)
MCV: 98.5 fL (ref 78.0–100.0)
PLATELETS: 208 10*3/uL (ref 150–400)
RBC: 3.33 MIL/uL — AB (ref 4.22–5.81)
RDW: 14.7 % (ref 11.5–15.5)
WBC: 6.7 10*3/uL (ref 4.0–10.5)

## 2017-04-04 LAB — URINALYSIS, COMPLETE (UACMP) WITH MICROSCOPIC
Bacteria, UA: NONE SEEN
Bilirubin Urine: NEGATIVE
GLUCOSE, UA: NEGATIVE mg/dL
Hgb urine dipstick: NEGATIVE
KETONES UR: NEGATIVE mg/dL
Leukocytes, UA: NEGATIVE
Nitrite: NEGATIVE
PH: 5 (ref 5.0–8.0)
Protein, ur: NEGATIVE mg/dL
Specific Gravity, Urine: 1.014 (ref 1.005–1.030)

## 2017-04-04 LAB — COMPREHENSIVE METABOLIC PANEL
ALBUMIN: 3.5 g/dL (ref 3.5–5.0)
ALK PHOS: 46 U/L (ref 38–126)
ALT: 15 U/L — ABNORMAL LOW (ref 17–63)
ANION GAP: 8 (ref 5–15)
AST: 18 U/L (ref 15–41)
BILIRUBIN TOTAL: 0.8 mg/dL (ref 0.3–1.2)
BUN: 16 mg/dL (ref 6–20)
CALCIUM: 8.7 mg/dL — AB (ref 8.9–10.3)
CO2: 24 mmol/L (ref 22–32)
Chloride: 103 mmol/L (ref 101–111)
Creatinine, Ser: 1.18 mg/dL (ref 0.61–1.24)
GFR calc non Af Amer: 54 mL/min — ABNORMAL LOW (ref >60)
GLUCOSE: 121 mg/dL — AB (ref 65–99)
POTASSIUM: 4.4 mmol/L (ref 3.5–5.1)
Sodium: 135 mmol/L (ref 135–145)
TOTAL PROTEIN: 6.2 g/dL — AB (ref 6.5–8.1)

## 2017-04-04 LAB — I-STAT TROPONIN, ED: TROPONIN I, POC: 0 ng/mL (ref 0.00–0.08)

## 2017-04-04 LAB — URINALYSIS, ROUTINE W REFLEX MICROSCOPIC
Bilirubin Urine: NEGATIVE
GLUCOSE, UA: NEGATIVE mg/dL
Hgb urine dipstick: NEGATIVE
KETONES UR: NEGATIVE mg/dL
Leukocytes, UA: NEGATIVE
NITRITE: NEGATIVE
Protein, ur: NEGATIVE mg/dL
Specific Gravity, Urine: 1.018 (ref 1.005–1.030)
pH: 5 (ref 5.0–8.0)

## 2017-04-04 LAB — GLUCOSE, CAPILLARY
Glucose-Capillary: 116 mg/dL — ABNORMAL HIGH (ref 65–99)
Glucose-Capillary: 191 mg/dL — ABNORMAL HIGH (ref 65–99)

## 2017-04-04 LAB — STREP PNEUMONIAE URINARY ANTIGEN: Strep Pneumo Urinary Antigen: NEGATIVE

## 2017-04-04 LAB — CREATININE, SERUM
Creatinine, Ser: 1.15 mg/dL (ref 0.61–1.24)
GFR calc non Af Amer: 56 mL/min — ABNORMAL LOW (ref >60)

## 2017-04-04 LAB — CBG MONITORING, ED: Glucose-Capillary: 115 mg/dL — ABNORMAL HIGH (ref 65–99)

## 2017-04-04 MED ORDER — DEXTROSE 5 % IV SOLN
1.0000 g | INTRAVENOUS | Status: DC
  Administered 2017-04-05: 1 g via INTRAVENOUS
  Filled 2017-04-04 (×2): qty 10

## 2017-04-04 MED ORDER — INSULIN ASPART 100 UNIT/ML ~~LOC~~ SOLN: 0.0000 [IU] | Freq: Every day | SUBCUTANEOUS | Status: DC

## 2017-04-04 MED ORDER — SODIUM CHLORIDE 0.9 % IV SOLN
INTRAVENOUS | Status: DC
  Administered 2017-04-04 – 2017-04-05 (×2): via INTRAVENOUS

## 2017-04-04 MED ORDER — GUAIFENESIN ER 600 MG PO TB12
600.0000 mg | ORAL_TABLET | Freq: Two times a day (BID) | ORAL | Status: DC
  Administered 2017-04-05 – 2017-04-06 (×3): 600 mg via ORAL
  Filled 2017-04-04 (×3): qty 1

## 2017-04-04 MED ORDER — INSULIN GLARGINE 100 UNIT/ML ~~LOC~~ SOLN
25.0000 [IU] | Freq: Every day | SUBCUTANEOUS | Status: DC
  Administered 2017-04-05: 25 [IU] via SUBCUTANEOUS
  Filled 2017-04-04 (×3): qty 0.25

## 2017-04-04 MED ORDER — ASPIRIN EC 325 MG PO TBEC
325.0000 mg | DELAYED_RELEASE_TABLET | Freq: Every day | ORAL | Status: DC
  Administered 2017-04-05 – 2017-04-06 (×2): 325 mg via ORAL
  Filled 2017-04-04 (×2): qty 1

## 2017-04-04 MED ORDER — ENOXAPARIN SODIUM 40 MG/0.4ML ~~LOC~~ SOLN
40.0000 mg | Freq: Every day | SUBCUTANEOUS | Status: DC
  Administered 2017-04-05 (×2): 40 mg via SUBCUTANEOUS
  Filled 2017-04-04 (×2): qty 0.4

## 2017-04-04 MED ORDER — DEXTROSE 5 % IV SOLN
1.0000 g | Freq: Once | INTRAVENOUS | Status: AC
  Administered 2017-04-04: 1 g via INTRAVENOUS
  Filled 2017-04-04: qty 10

## 2017-04-04 MED ORDER — DOXYCYCLINE HYCLATE 100 MG PO TABS
100.0000 mg | ORAL_TABLET | Freq: Once | ORAL | Status: AC
  Administered 2017-04-04: 100 mg via ORAL
  Filled 2017-04-04: qty 1

## 2017-04-04 MED ORDER — DEXTROSE 5 % IV SOLN
500.0000 mg | INTRAVENOUS | Status: DC
  Administered 2017-04-04 – 2017-04-05 (×2): 500 mg via INTRAVENOUS
  Filled 2017-04-04 (×2): qty 500

## 2017-04-04 MED ORDER — VITAMIN D 1000 UNITS PO TABS
1000.0000 [IU] | ORAL_TABLET | Freq: Every day | ORAL | Status: DC
  Administered 2017-04-05 – 2017-04-06 (×2): 1000 [IU] via ORAL
  Filled 2017-04-04 (×2): qty 1

## 2017-04-04 MED ORDER — ADULT MULTIVITAMIN W/MINERALS CH
1.0000 | ORAL_TABLET | Freq: Every day | ORAL | Status: DC
  Administered 2017-04-05 – 2017-04-06 (×2): 1 via ORAL
  Filled 2017-04-04 (×2): qty 1

## 2017-04-04 MED ORDER — SIMVASTATIN 40 MG PO TABS
40.0000 mg | ORAL_TABLET | Freq: Every day | ORAL | Status: DC
  Administered 2017-04-05 – 2017-04-06 (×2): 40 mg via ORAL
  Filled 2017-04-04 (×2): qty 1

## 2017-04-04 MED ORDER — STROKE: EARLY STAGES OF RECOVERY BOOK
Freq: Once | Status: AC
  Administered 2017-04-04: 23:00:00
  Filled 2017-04-04: qty 1

## 2017-04-04 MED ORDER — GUAIFENESIN-DM 100-10 MG/5ML PO SYRP: 5.0000 mL | ORAL_SOLUTION | ORAL | Status: DC | PRN

## 2017-04-04 MED ORDER — BENZONATATE 100 MG PO CAPS
100.0000 mg | ORAL_CAPSULE | Freq: Three times a day (TID) | ORAL | Status: DC
  Administered 2017-04-05 – 2017-04-06 (×4): 100 mg via ORAL
  Filled 2017-04-04 (×4): qty 1

## 2017-04-04 MED ORDER — INSULIN ASPART 100 UNIT/ML ~~LOC~~ SOLN
0.0000 [IU] | Freq: Three times a day (TID) | SUBCUTANEOUS | Status: DC
  Administered 2017-04-05: 3 [IU] via SUBCUTANEOUS
  Administered 2017-04-05: 1 [IU] via SUBCUTANEOUS
  Administered 2017-04-06: 3 [IU] via SUBCUTANEOUS
  Administered 2017-04-06: 1 [IU] via SUBCUTANEOUS

## 2017-04-04 MED ORDER — ACETAMINOPHEN 325 MG PO TABS: 650.0000 mg | ORAL_TABLET | Freq: Four times a day (QID) | ORAL | Status: DC | PRN

## 2017-04-04 MED ORDER — ALBUTEROL SULFATE (2.5 MG/3ML) 0.083% IN NEBU: 2.5000 mg | INHALATION_SOLUTION | RESPIRATORY_TRACT | Status: DC | PRN

## 2017-04-04 MED ORDER — PANTOPRAZOLE SODIUM 40 MG PO TBEC
40.0000 mg | DELAYED_RELEASE_TABLET | Freq: Every day | ORAL | Status: DC
  Administered 2017-04-05 – 2017-04-06 (×2): 40 mg via ORAL
  Filled 2017-04-04 (×2): qty 1

## 2017-04-04 MED ORDER — SODIUM CHLORIDE 0.9 % IV SOLN
INTRAVENOUS | Status: DC
  Administered 2017-04-04: 16:00:00 via INTRAVENOUS

## 2017-04-04 NOTE — ED Triage Notes (Signed)
Pt BIB [facility] from home where wife reports pt had a 5 min period of AMS today, pt reports he was dizzy at this time and confused. Family reports he has not been himself the past few days and he has had decreased activity.

## 2017-04-04 NOTE — Progress Notes (Signed)
[name] [name] is a 83 y.o. male patient admitted from ED awake, alert - oriented  X 4 - no acute distress noted.  VSS - Blood pressure (!) 133/59, pulse 72, temperature 98.4 F (36.9 C), temperature source Oral, resp. rate 20, height 5\' 9"  (1.753 m), weight 97.5 kg (215 lb), SpO2 98 %.    IV in place, occlusive dsg intact without redness.  Orientation to room, and floor completed with information packet given to patient/family.  Patient declined safety video at this time.  Admission INP armband ID verified with patient/family, and in place.   SR up x 2, fall assessment complete, with patient and family able to verbalize understanding of risk associated with falls, and verbalized understanding to call nsg before up out of bed.  Call light within reach, patient able to voice, and demonstrate understanding.  Skin, clean-dry- intact without evidence of bruising, or skin tears.   No evidence of skin break down noted on exam.     Will cont to eval and treat per MD orders.  [name] NeedlesIreti O Tomeshia Pizzi, RN 04/04/2017 7:19 PM

## 2017-04-04 NOTE — ED Notes (Signed)
Patient transported to X-ray 

## 2017-04-04 NOTE — ED Provider Notes (Signed)
MSE was initiated and I personally evaluated the patient and placed orders (if any) at  2:30 PM on April 04, 2017.  The patient appears stable so that the remainder of the MSE may be completed by another provider  Patient presents with increased confusion has been worsening chest today. He's had a worsening cough. He had a near-syncopal type event today. Currently he is oriented to person and place. He he has no unilateral weakness. Vital signs are stable. Labs and chest x-ray ordered.Rolan Bucco.   Amrit Cress, MD 04/04/17 1430

## 2017-04-04 NOTE — ED Notes (Signed)
Attempted report 

## 2017-04-04 NOTE — ED Notes (Signed)
Patient transported to CT 

## 2017-04-04 NOTE — Progress Notes (Signed)
Report received from the ED. Pt arrived at the unit at 1835.

## 2017-04-04 NOTE — ED Provider Notes (Signed)
[facility]-EMERGENCY DEPT Provider Note   CSN: [id number] Arrival date & time: 04/04/17  1354     History   Chief Complaint Chief Complaint  Patient presents with  . Altered Mental Status    HPI [name] [name] is a 83 y.o. male.  Patient with mild dementia, diabetes, lipids, obesity history presents with transient confusion, general weakness and dry hacking cough. Symptoms worse for the past 3 days. No focal neuro concerns. Patient chronically lined in the right eye. No fevers. No new medications. Patient wears see Pap at night for sleep apnea. No head injuries recently      Past Medical History:  Diagnosis Date  . Anxiety   . Dementia   . Diabetes mellitus   . Hyperlipidemia   . Obesity   . Sleep apnea    uses CPAP at night to sleep    Patient Active Problem List   Diagnosis Date Noted  . Change in bowel habits 11/06/2016  . Umbilical hernia without obstruction and without gangrene 11/06/2016  . 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 Essential tremor 08/20/2015  . MCI (mild cognitive impairment) with memory loss 08/20/2015  . Memory loss 02/13/2015  . DM 07/19/2008  . DYSLIPIDEMIA 07/19/2008  . OBSTRUCTIVE SLEEP APNEA 07/19/2008    Past Surgical History:  Procedure Laterality Date  . HERNIA REPAIR Right   . REPLACEMENT TOTAL KNEE Left    Dr Bethann GooGioffee / Dr Fannie KneeSue  . TRANSURETHRAL RESECTION OF PROSTATE         Home Medications    Prior to Admission medications   Medication Sig Start Date End Date Taking? Authorizing Provider  B-D ULTRAFINE III SHORT PEN 31G X 8 MM MISC  12/28/14   [provider]  Cholecalciferol (VITAMIN D-3 PO) 1000 IU tablets, takes 3 daily    [provider]  citalopram (CELEXA) 20 MG tablet TAKE 1 TABLET (20 MG TOTAL) BY MOUTH DAILY. 08/04/16   Dohmeier, Porfirio Mylararmen, MD  co-enzyme Q-10 30 MG capsule Take 30 mg by mouth daily.    [provider]  doxazosin (CARDURA) 4 MG tablet Take 4 mg by mouth daily.    [provider]  folic acid (FOLVITE) 800 MCG tablet Take 800 mcg by mouth daily.    [provider]  insulin glargine (LANTUS SOLOSTAR) 100 UNIT/ML injection Inject 26 Units into the skin every evening.     [provider]  loratadine-pseudoephedrine (CLARITIN-D 24-HOUR) 10-240 MG per 24 hr tablet Take 1 tablet by mouth daily as needed for allergies.    [provider]  metFORMIN (GLUCOPHAGE) 1000 MG tablet Take 1,000 mg by mouth 2 (two) times daily with a meal.    [provider]  methotrexate 2.5 MG tablet Take 15 mg by mouth once a week. sunday    [provider]  Multiple Vitamin (MULTIVITAMIN WITH MINERALS) TABS Take 1 tablet by mouth daily.    [provider]  omeprazole (PRILOSEC) 40 MG capsule Take 40 mg by mouth daily. 12/06/14   [provider]  simvastatin (ZOCOR) 40 MG tablet Take 40 mg by mouth daily. 08/03/15   [provider]    Family History Family History  Problem Relation Age of Onset  . Liver disease Mother   . Cancer - Lung Father        smoker    Social History Social History  Substance Use Topics  . Smoking status: Never Smoker  . Smokeless tobacco: Never Used  . Alcohol use No  Allergies   Donepezil; Namenda [memantine hcl]; Robaxin [methocarbamol]; and Lisinopril   Review of Systems Review of Systems  Constitutional: Positive for appetite change and fatigue. Negative for chills and fever.  HENT: Negative for congestion.   Eyes: Negative for visual disturbance.  Respiratory: Positive for cough. Negative for shortness of breath.   Cardiovascular: Negative for chest pain.  Gastrointestinal: Negative for abdominal pain and vomiting.  Genitourinary: Negative for dysuria and flank pain.  Musculoskeletal: Positive for back pain. Negative for neck pain and neck stiffness.  Skin: Negative for rash.  Neurological: Positive for light-headedness. Negative for headaches.     Physical  Exam Updated Vital Signs BP 136/70   Pulse (!) 57   Temp 98.3 F (36.8 C) (Oral)   Resp (!) 22   Ht 5\' 9"  (1.753 m)   Wt 97.5 kg (215 lb)   SpO2 96%   BMI 31.75 kg/m   Physical Exam  Constitutional: He is oriented to person, place, and time. He appears well-developed and well-nourished.  HENT:  Head: Normocephalic and atraumatic.  Eyes: Conjunctivae are normal. Right eye exhibits no discharge. Left eye exhibits no discharge.  Neck: Normal range of motion. Neck supple. No tracheal deviation present.  Cardiovascular: Normal rate and regular rhythm.   Pulmonary/Chest: Effort normal and breath sounds normal.  Abdominal: Soft. He exhibits no distension. There is no tenderness. There is no guarding.  Musculoskeletal: He exhibits no edema.  Neurological: He is alert and oriented to person, place, and time.  Gen. weakness. No focal weakness on exam 5+ strength with no drift of arms and legs bilateral. Gross sensation intact bilateral. Finger-nose intact extraocular muscle function intact.  Skin: Skin is warm. No rash noted.  Psychiatric: He has a normal mood and affect.  Nursing note and vitals reviewed.    ED Treatments / Results  Labs (all labs ordered are listed, but only abnormal results are displayed) Labs Reviewed  CBC WITH DIFFERENTIAL/PLATELET - Abnormal; Notable for the following:       Result Value   RBC 3.36 (*)    Hemoglobin 10.7 (*)    HCT 33.1 (*)    All other components within normal limits  COMPREHENSIVE METABOLIC PANEL - Abnormal; Notable for the following:    Glucose, Bld 121 (*)    Calcium 8.7 (*)    Total Protein 6.2 (*)    ALT 15 (*)    GFR calc non Af Amer 54 (*)    All other components within normal limits  CBG MONITORING, ED - Abnormal; Notable for the following:    Glucose-Capillary 115 (*)    All other components within normal limits  CULTURE, BLOOD (ROUTINE X 2)  CULTURE, BLOOD (ROUTINE X 2)  URINE CULTURE  URINALYSIS, ROUTINE W REFLEX  MICROSCOPIC  I-STAT TROPONIN, ED    EKG  EKG Interpretation  Date/Time:  Saturday April 04 2017 14:00:38 EDT Ventricular Rate:  59 PR Interval:    QRS Duration: 98 QT Interval:  417 QTC Calculation: 414 R Axis:   4 Text Interpretation:  Sinus rhythm RSR' in V1 or V2, right VCD or RVH since last tracing no significant change Confirmed by Rolan Bucco [phone]) on 04/04/2017 2:40:30 PM       Radiology Dg Chest 2 View  Result Date: 04/04/2017 CLINICAL DATA:  Cough. EXAM: CHEST  2 VIEW COMPARISON:  No comparison studies available. FINDINGS: AP and lateral views of the chest show enlargement of the cardiopericardial silhouette. No focal airspace consolidation. Interstitial markings  are diffusely coarsened with chronic features. There is some trace left basilar opacity which may be related atelectasis or pneumonia. No pleural effusion. Degenerative changes noted in both shoulders. Telemetry leads overlie the chest. IMPRESSION: Cardiomegaly with underlying chronic interstitial lung disease. Left base atelectasis or pneumonia. Electronically Signed   By: Kennith Center M.D.   On: 04/04/2017 15:10    Procedures Procedures (including critical care time)  Medications Ordered in ED Medications  0.9 %  sodium chloride infusion (not administered)  cefTRIAXone (ROCEPHIN) 1 g in dextrose 5 % 50 mL IVPB (not administered)  doxycycline (VIBRA-TABS) tablet 100 mg (not administered)     Initial Impression / Assessment and Plan / ED Course  I have reviewed the triage vital signs and the nursing notes.  Pertinent labs & imaging results that were available during my care of the patient were reviewed by me and considered in my medical decision making (see chart for details).    Patient presents with general weakness and concern for pneumonia. Chest x-ray reviewed. Community acquired antibiotics given. CT head and urinalysis pending. With patient being significantly weaker than baseline and near  syncope/significant weakness plan for observation to the hospital  The patients results and plan were reviewed and discussed.   Any x-rays performed were independently reviewed by myself.   Differential diagnosis were considered with the presenting HPI.  Medications  0.9 %  sodium chloride infusion (not administered)  cefTRIAXone (ROCEPHIN) 1 g in dextrose 5 % 50 mL IVPB (not administered)  doxycycline (VIBRA-TABS) tablet 100 mg (not administered)    Vitals:   04/04/17 1430 04/04/17 1445 04/04/17 1515 04/04/17 1530  BP: 118/87 (!) 117/42 126/90 136/70  Pulse: 60 (!) 59 69 (!) 57  Resp: 15 17 17  (!) 22  Temp:      TempSrc:      SpO2: 98% 98% 100% 96%  Weight:      Height:        Final diagnoses:  Community acquired pneumonia of left lower lobe of lung (HCC)  General weakness  Transient confusion    Admission/ observation were discussed with the admitting physician, patient and/or family and they are comfortable with the plan.    Final Clinical Impressions(s) / ED Diagnoses   Final diagnoses:  Community acquired pneumonia of left lower lobe of lung (HCC)  General weakness  Transient confusion    New Prescriptions New Prescriptions   No medications on file     Blane Ohara, MD 04/08/17 1503

## 2017-04-04 NOTE — ED Notes (Signed)
Returned from ct scan 

## 2017-04-04 NOTE — Progress Notes (Signed)
*  PRELIMINARY RESULTS* Vascular Ultrasound Right lower extremity venous duplex has been completed.  Preliminary findings: No evidence of DVT or baker's cyst.    Farrel DemarkJill Eunice, RDMS, RVT  04/04/2017, 3:45 PM

## 2017-04-04 NOTE — ED Notes (Signed)
Patient transported to MRI 

## 2017-04-04 NOTE — H&P (Signed)
History and Physical        Hospital Admission Note Date: 04/04/2017  Patient name: [name] [name] Medical record number: [id number] Date of birth: 08/25/1929 Age: 83 y.o. Gender: male  PCP: [name], David, MD    Patient coming from: home   [facility].    Chief Complaint:  Coughing with altered mental status today, dizziness  HPI: Patient is a 83 year old male with diabetes mellitus, hyperlipidemia, dementia presented from home via EMS. Patient and his wife reported that he has not been himself for last few days, feeling fatigued and weak for the last 3-4 days. He has been coughing with chills, lethargic. Since yesterday he was having confusion and today he was more dizzy and confused. The patient is a poor historian and has dementia hence difficult to obtain review of systems from the patient. Patient's wife did not notice any focal weakness, difficulty speaking or eating. Patient also had a near syncopal episode today due to weakness. He has not been eating well for the last 2 days.  ED work-up/course:  Temp 98.3, heart rate 53, respiration 24, BP 137/71, O2 sats 98% on room air Chest x-ray showed cardiomegaly with underlying chronic interstitial lung disease. Left base atelectasis or pneumonia. CT head showed a vague hypodensity within the left basal ganglia could relate to age indeterminate lacunar infarct.  Review of Systems: Positives marked in 'bold' Patient has dementia hence difficult to obtain review of systems from the patient  Past Medical History: Past Medical History:  Diagnosis Date  . Anxiety   . Dementia   . Diabetes mellitus   . Hyperlipidemia   . Obesity   . Sleep apnea    uses CPAP at night to sleep    Past Surgical History:  Procedure Laterality Date  . HERNIA REPAIR Right   . REPLACEMENT TOTAL  KNEE Left    Dr Bethann GooGioffee / Dr Fannie KneeSue  . TRANSURETHRAL RESECTION OF PROSTATE      Medications: Prior to Admission medications   Medication Sig Start Date End Date Taking? Authorizing Provider  Cholecalciferol (VITAMIN D-3 PO) Take 1,000 Units by mouth daily.    Yes [provider]  citalopram (CELEXA) 20 MG tablet TAKE 1 TABLET (20 MG TOTAL) BY MOUTH DAILY. 08/04/16  Yes Dohmeier, Porfirio Mylararmen, MD  co-enzyme Q-10 30 MG capsule Take 30 mg by mouth daily.   Yes [provider]  doxazosin (CARDURA) 4 MG tablet Take 4 mg by mouth daily.   Yes [provider]  folic acid (FOLVITE) 800 MCG tablet Take 800 mcg by mouth daily.   Yes [provider]  insulin glargine (LANTUS SOLOSTAR) 100 UNIT/ML injection Inject 28 Units into the skin daily after supper.    Yes [provider]  loratadine-pseudoephedrine (CLARITIN-D 24-HOUR) 10-240 MG per 24 hr tablet Take 1 tablet by mouth daily as needed for allergies.   Yes [provider]  metFORMIN (GLUCOPHAGE) 1000 MG tablet Take 1,000 mg by mouth 2 (two) times daily with a meal.   Yes [provider]  Multiple Vitamin (MULTIVITAMIN WITH MINERALS) TABS Take 1 tablet by mouth daily.   Yes  [provider]  omeprazole (PRILOSEC) 40 MG capsule Take 40 mg by mouth daily. 12/06/14  Yes [provider]  simvastatin (ZOCOR) 40 MG tablet Take 40 mg by mouth daily. 08/03/15  Yes [provider]  B-D ULTRAFINE III SHORT PEN 31G X 8 MM MISC  12/28/14   [provider]  methotrexate 2.5 MG tablet Take 12.5 mg by mouth every Wednesday.     [provider]    Allergies:   Allergies  Allergen Reactions  . Donepezil Shortness Of Breath and Swelling  . Lisinopril Anaphylaxis, Shortness Of Breath and Swelling    HAD TO COME TO THE E.D. AFTER TAKING THIS!!  . [name] Hcl] Shortness Of Breath and Swelling  . Robaxin [Methocarbamol] Anaphylaxis, Shortness Of Breath and  Swelling    HAD TO COME TO THE E.D. AFTER TAKING THIS!!    Social History:  reports that he has never smoked. He has never used smokeless tobacco. He reports that he does not drink alcohol or use drugs.  Family History: Family History  Problem Relation Age of Onset  . Liver disease Mother   . Cancer - Lung Father        smoker    Physical Exam: Blood pressure (!) 146/71, pulse 81, temperature 98.3 F (36.8 C), temperature source Oral, resp. rate (!) 23, height 5\' 9"  (1.753 m), weight 97.5 kg (215 lb), SpO2 98 %. General: Alert, awake, oriented x2, person and place, in no acute distress. Eyes: pink conjunctiva,anicteric sclera, pupils equal and reactive to light and accomodation, HEENT: normocephalic, atraumatic, oropharynx clear Neck: supple, no masses or lymphadenopathy, no goiter, no bruits, no JVD CVS: Regular rate and rhythm, without murmurs, rubs or gallops. No lower extremity edema Resp :Decreased breath sounds at the bases GI : Soft, nontender, nondistended, positive bowel sounds, no masses. No hepatomegaly. No hernia.  Musculoskeletal: No clubbing or cyanosis, positive pedal pulses. No contracture. ROM intact  Neuro: Grossly intact, no focal neurological deficits, strength 5/5 upper and lower extremities bilaterally, overall global weakness Psych: alert and oriented x 2, still somewhat confused Skin: no rashes or lesions, warm and dry   LABS on Admission: I have personally reviewed all the labs and imagings below    Basic Metabolic Panel:  Recent Labs Lab 04/04/17 1446  NA 135  K 4.4  CL 103  CO2 24  GLUCOSE 121*  BUN 16  CREATININE 1.18  CALCIUM 8.7*   Liver Function Tests:  Recent Labs Lab 04/04/17 1446  AST 18  ALT 15*  ALKPHOS 46  BILITOT 0.8  PROT 6.2*  ALBUMIN 3.5   No results for input(s): LIPASE, AMYLASE in the last 168 hours. No results for input(s): AMMONIA in the last 168 hours. CBC:  Recent Labs Lab 04/04/17 1446  WBC 6.2    NEUTROABS 3.7  HGB 10.7*  HCT 33.1*  MCV 98.5  PLT 211   Cardiac Enzymes: No results for input(s): CKTOTAL, CKMB, CKMBINDEX, TROPONINI in the last 168 hours. BNP: Invalid input(s): POCBNP CBG:  Recent Labs Lab 04/04/17 1436  GLUCAP 115*    Radiological Exams on Admission:  Dg Chest 2 View  Result Date: 04/04/2017 CLINICAL DATA:  Cough. EXAM: CHEST  2 VIEW COMPARISON:  No comparison studies available. FINDINGS: AP and lateral views of the chest show enlargement of the cardiopericardial silhouette. No focal airspace consolidation. Interstitial markings are diffusely coarsened with chronic features. There is some trace left basilar opacity which may be related atelectasis or pneumonia.  No pleural effusion. Degenerative changes noted in both shoulders. Telemetry leads overlie the chest. IMPRESSION: Cardiomegaly with underlying chronic interstitial lung disease. Left base atelectasis or pneumonia. Electronically Signed   By: Kennith Center M.D.   On: 04/04/2017 15:10   Ct Head Wo Contrast  Result Date: 04/04/2017 CLINICAL DATA:  Altered mental status near syncopal episode EXAM: CT HEAD WITHOUT CONTRAST TECHNIQUE: Contiguous axial images were obtained from the base of the skull through the vertex without intravenous contrast. COMPARISON:  MRI 04/30/2012 FINDINGS: Brain: No large territorial infarction, hemorrhage or intracranial mass is seen. Mild hypodensity in the left basal ganglia. Mild hypodensity in the white matter consistent with small vessel ischemic changes. Moderate atrophy. Ventricles within normal limits given atrophy. Vascular: No hyperdense vessels.  Carotid artery calcifications. Skull: No fracture. Sinuses/Orbits: Mucosal thickening in the maxillary sphenoid and ethmoid sinuses. Completely opacified left frontal sinus. Bony remodeling suggests chronic sinusitis. No acute orbital abnormality. Other: None IMPRESSION: 1. Vague hypodensity within the left basal ganglia, could relate  to a age indeterminate lacunar infarct. There are otherwise no acute intracranial abnormality seen. 2. Small vessel ischemic changes of the white matter.  Atrophy. 3. Pan sinusitis Electronically Signed   By: Jasmine Pang M.D.   On: 04/04/2017 16:40      EKG: Independently reviewed. Rate 59, RSR pattern in V1 and V2, no acute ST-T wave changes suggestive of ischemia   Assessment/Plan Principal Problem:   CAP (community acquired pneumonia) presenting with coughing, chills, generalized weakness - Chest x-ray showed left lung infiltrates.  - Place on pneumonia protocol, on a Zithromax, Rocephin, obtain blood cultures, sputum cultures, urine Legionella antigen, urine strep antigen - Place on antitussives, albuterol nebs as needed, Mucinex  Active Problems:    Acute metabolic encephalopathy In the setting of dementia, community-acquired pneumonia - CT head showed vague hypodensity in the left basal ganglia, could relate to age indeterminate lacunar infarct - Discussed in detail with patient and wife at the bedside, given patient had transient confusion with near syncopal episode, abnormal CT findings, there is a possibility of stroke. Patient and his wife would like me to confirm if patient indeed had a new stroke, will order MRI of the brain. If MRI is positive, patient wants to pursue full stroke workup. If MRI is negative, will treat pneumonia and PT evaluation. - Aspirin 325 mg for now    Diabetes mellitus (HCC) - Placed on Lantus 25 units at bedtime, sensitive sliding scale insulin, obtain hemoglobin A1c - Hold metformin    Hyperlipidemia - Obtain lipid panel in a.m., continue Zocor  DVT prophylaxis: Lovenox  CODE STATUS: Discussed in detail with the patient, after to be full CODE STATUS  Consults called: None  Family Communication: Admission, patients condition and plan of care including tests being ordered have been discussed with the patient and  Wife who indicates  understanding and agree with the plan and Code Status  Admission status: Inpatient telemetry  Disposition plan: Further plan will depend as patient's clinical course evolves and further radiologic and laboratory data become available.    At the time of admission, it appears that the appropriate admission status for this patient is INPATIENT . This is judged to be reasonable and necessary in order to provide the required intensity of service to ensure the patient's safety given the presenting symptoms coughing, chills, chest x-ray findings of pneumonia, CT findings of possible CVA, physical exam findings, and initial radiographic and laboratory data in the context of their chronic  comorbidities.  The medical decision making on this patient was of high complexity and the patient is at high risk for clinical deterioration, therefore this is a level 3 visit.   Time Spent on Admission: 70 mins     Ripudeep Rai M.D. [facility] 04/04/2017, 5:51 PM Pager: [phone]  If 7PM-7AM, please contact night-coverage [email or web address] Password [email or web address]

## 2017-04-05 ENCOUNTER — Inpatient Hospital Stay (HOSPITAL_COMMUNITY): Payer: Medicare Other

## 2017-04-05 ENCOUNTER — Encounter (HOSPITAL_COMMUNITY): Payer: Self-pay | Admitting: *Deleted

## 2017-04-05 DIAGNOSIS — I361 Nonrheumatic tricuspid (valve) insufficiency: Secondary | ICD-10-CM

## 2017-04-05 DIAGNOSIS — I639 Cerebral infarction, unspecified: Secondary | ICD-10-CM

## 2017-04-05 HISTORY — DX: Cerebral infarction, unspecified: I63.9

## 2017-04-05 LAB — GLUCOSE, CAPILLARY
GLUCOSE-CAPILLARY: 126 mg/dL — AB (ref 65–99)
GLUCOSE-CAPILLARY: 197 mg/dL — AB (ref 65–99)
Glucose-Capillary: 121 mg/dL — ABNORMAL HIGH (ref 65–99)
Glucose-Capillary: 219 mg/dL — ABNORMAL HIGH (ref 65–99)

## 2017-04-05 LAB — BASIC METABOLIC PANEL
ANION GAP: 9 (ref 5–15)
BUN: 14 mg/dL (ref 6–20)
CO2: 23 mmol/L (ref 22–32)
Calcium: 8.7 mg/dL — ABNORMAL LOW (ref 8.9–10.3)
Chloride: 107 mmol/L (ref 101–111)
Creatinine, Ser: 1.05 mg/dL (ref 0.61–1.24)
GFR calc Af Amer: >60 mL/min (ref >60)
GFR calc non Af Amer: >60 mL/min (ref >60)
GLUCOSE: 125 mg/dL — AB (ref 65–99)
POTASSIUM: 4.7 mmol/L (ref 3.5–5.1)
Sodium: 139 mmol/L (ref 135–145)

## 2017-04-05 LAB — CBC
HEMATOCRIT: 33.6 % — AB (ref 39.0–52.0)
HEMOGLOBIN: 10.9 g/dL — AB (ref 13.0–17.0)
MCH: 32.1 pg (ref 26.0–34.0)
MCHC: 32.4 g/dL (ref 30.0–36.0)
MCV: 98.8 fL (ref 78.0–100.0)
Platelets: 206 10*3/uL (ref 150–400)
RBC: 3.4 MIL/uL — ABNORMAL LOW (ref 4.22–5.81)
RDW: 15.2 % (ref 11.5–15.5)
WBC: 7.2 10*3/uL (ref 4.0–10.5)

## 2017-04-05 LAB — LIPID PANEL
CHOL/HDL RATIO: 2.5 ratio
CHOLESTEROL: 128 mg/dL (ref 0–200)
HDL: 51 mg/dL (ref >40)
LDL Cholesterol: 57 mg/dL (ref 0–99)
TRIGLYCERIDES: 99 mg/dL (ref <150)
VLDL: 20 mg/dL (ref 0–40)

## 2017-04-05 LAB — ECHOCARDIOGRAM COMPLETE
Height: 69 in
Weight: 3440 oz

## 2017-04-05 LAB — URINE CULTURE
Culture: <10000 — AB
Culture: <10000 — AB

## 2017-04-05 NOTE — Progress Notes (Signed)
[name] FarmCharles [name] [name] [id number] Admitted to [id number] 04/04/17 20:08 Attending Provider: Cathren Harshai, Ripudeep K, MD    [name] [name] is a 83 y.o. male patient admitted from ED awake, alert  & oriented  X 3,  Full Code, VSS -WNL, RA, no c/o shortness of breath, no c/o chest pain, no distress noted. Tele # [phone] placed and pt is currently running: SR with PACs.   IV site WDL:  with a transparent dsg that's clean dry and intact.  Allergies:   Allergies  Allergen Reactions  . Donepezil Shortness Of Breath and Swelling  . Lisinopril Anaphylaxis, Shortness Of Breath and Swelling    HAD TO COME TO THE E.D. AFTER TAKING THIS!!  . [name] CoombsNamenda [Memantine Hcl] Shortness Of Breath and Swelling  . Robaxin [Methocarbamol] Anaphylaxis, Shortness Of Breath and Swelling    HAD TO COME TO THE E.D. AFTER TAKING THIS!!     Past Medical History:  Diagnosis Date  . Anxiety   . Dementia   . Diabetes mellitus   . Hyperlipidemia   . Obesity   . Sleep apnea    uses CPAP at night to sleep    History:  obtained from patient and wife  Pt orientation to unit, room and routine. Information packet given to patient/family.  Admission INP armband ID verified with patient/family, and in place. SR up x 2, fall risk assessment complete with Patient and family verbalizing understanding of risks associated with falls. Pt verbalizes an understanding of how to use the call bell and to call for help before getting out of bed.  Skin, clean-dry- intact without evidence of bruising, or skin tears.   No evidence of skin break down noted on exam.   Will cont to monitor and assist as needed.  [name] [name], [name] Beavers Nicole, RN 04/05/2017 12:54 AM

## 2017-04-05 NOTE — Evaluation (Signed)
Speech Language Pathology Evaluation Patient Details Name: [name] [name] MRN: [id number] DOB: 1929-08-18 Today's Date: 04/05/2017 Time: [id number] SLP Time Calculation (min) (ACUTE ONLY): 21 min  Problem List:  Patient Active Problem List   Diagnosis Date Noted  . Acute CVA (cerebrovascular accident) (HCC) 04/05/2017  . CAP (community acquired pneumonia) 04/04/2017  . Acute metabolic encephalopathy 04/04/2017  . Dementia 04/04/2017  . Diabetes mellitus (HCC) 04/04/2017  . Hyperlipidemia 04/04/2017  . Change in bowel habits 11/06/2016  . Umbilical hernia without obstruction and without gangrene 11/06/2016  . 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 Essential tremor 08/20/2015  . MCI (mild cognitive impairment) with memory loss 08/20/2015  . Memory loss 02/13/2015  . OBSTRUCTIVE SLEEP APNEA 07/19/2008   Past Medical History:  Past Medical History:  Diagnosis Date  . Anxiety   . Dementia   . Diabetes mellitus   . Hyperlipidemia   . Obesity   . Sleep apnea    uses CPAP at night to sleep   Past Surgical History:  Past Surgical History:  Procedure Laterality Date  . HERNIA REPAIR Right   . REPLACEMENT TOTAL KNEE Left    Dr Bethann GooGioffee / Dr Fannie KneeSue  . TRANSURETHRAL RESECTION OF PROSTATE     HPI:  [name] ShengCharles [name] Websteris an 83 y.o.malewho presented from home via EMS for AMS. His wife stated that he had been fatigued and diffusely weak for about 3-4 days PTA. Also developed confusion on Friday. Symptoms inculded dizziness, near syncope and decreased appetite. No speech deficit or focal weakness endorsed by his wife. The patient has a diagnosis of mild dementia per his wife. MRI revealed a smallacute infarction affecting the left basal ganglia extending into the radiating white matter tracts. Atrophy and chronic small vessel ischemic changes were also noted. CXR showed Left base atelectasis or pneumonia.   Assessment / Plan / Recommendation Clinical Impression  Patient presents with  baseline mild cognitive communication impairment with deficits in short term memory, mild dysarthria which family report is baseline given pt's absent dentition. In conversation, pt noted to have frequent anomia; daughters report this is worse than his baseline. Recommend HH SLP to address pt's cognitive-linguistic deficits to facilitate safe transition to home and to maximize cognition and communication for quality of life. No further acute needs identified; SLP will s/o.     SLP Assessment  SLP Recommendation/Assessment: All further Speech Lanaguage Pathology  needs can be addressed in the next venue of care SLP Visit Diagnosis: Dysarthria and anarthria (R47.1);Aphasia (R47.01);Cognitive communication deficit (R41.841)    Follow Up Recommendations  Home health SLP    Frequency and Duration           SLP Evaluation Cognition  Overall Cognitive Status: Impaired/Different from baseline Arousal/Alertness: Awake/alert Orientation Level: Oriented to person;Oriented to situation Attention: Sustained Sustained Attention: Appears intact Memory: Impaired Memory Impairment: Decreased short term memory Decreased Short Term Memory: Verbal basic;Functional basic Awareness: Impaired Awareness Impairment: Intellectual impairment Safety/Judgment: Impaired       Comprehension  Auditory Comprehension Overall Auditory Comprehension: Appears within functional limits for tasks assessed Visual Recognition/Discrimination Discrimination: Not tested Reading Comprehension Reading Status: Not tested    Expression Expression Primary Mode of Expression: Verbal Verbal Expression Overall Verbal Expression: Impaired Initiation: No impairment Automatic Speech: Name;Social Response Level of Generative/Spontaneous Verbalization: Sentence Other Verbal Expression Comments: multiple word-finding lapses in conversation Written Expression Dominant Hand: Left Written Expression: Not tested   Oral / Motor   Oral Motor/Sensory Function Overall Oral Motor/Sensory Function: Within functional limits (mild  tremor noted) Motor Speech Overall Motor Speech: Impaired Respiration: Within functional limits Phonation: Normal;Other (comment) (gravelly) Resonance: Within functional limits Articulation: Impaired Level of Impairment: Word Intelligibility: Intelligibility reduced Word: 75-100% accurate Phrase: 75-100% accurate Sentence: 75-100% accurate Conversation: 75-100% accurate Motor Planning: Witnin functional limits Interfering Components: Inadequate dentition   GO                   [name], MS, CCC-SLP Speech-Language Pathologist [phone]  Arlana Lindau 04/05/2017, 12:39 PM

## 2017-04-05 NOTE — Evaluation (Signed)
Physical Therapy Evaluation Patient Details Name: [name] [name] MRN: [id number] DOB: 22-Nov-1928 Today's Date: 04/05/2017   History of Present Illness    [name] [name] Websteris an 83 y.o.malewho presented from home via EMS for AMS. His wife stated that he had been fatigued and diffusely weak for about 3-4 days PTA. Also developed confusion on Friday. Symptoms inculded dizziness, near syncope and decreased appetite. No speech deficit or focal weakness endorsed by his wife. The patient has a diagnosis of mild dementia per his wife. MRI revealed a smallacute infarction affecting the left basal ganglia extending into the radiating white matter tracts. Atrophy and chronic small vessel ischemic changes were also noted. CXR showed Left base atelectasis or pneumonia.      Clinical Impression  Pt presents near baseline functional level, requires device for ambulation and needs at least supervision for safety.  Mild instability plus reluctance to use adequate ambulatory device (ie RW instead of single point cane) increased pt's risk for falls at home.  Both pt and spouse are adamantly against SNF or similar d/c, though pt could benefit from short stay in rehab facility.  However, with maximal HH services, d/c home is an acceptable option.  Recommend RW for pt.  No further acute PT needs, up with nursing assist.    Follow Up Recommendations Home health PT;Supervision/Assistance - 24 hour    Equipment Recommendations  Rolling walker with 5" wheels (add 2" wheels to back?)    Recommendations for Other Services       Precautions / Restrictions Precautions Precautions: Fall Precaution Comments: bed alarm; up with A, use RW      Mobility  Bed Mobility Overal bed mobility: Modified Independent             General bed mobility comments: in/out with HOB up, no issues, incr time to repostion  Transfers Overall transfer level: Needs assistance Equipment used: Rolling walker (2  wheeled);None Transfers: Sit to/from [employer] to Stand: Min guard;Supervision Stand pivot transfers: Min guard       General transfer comment: pt able to stand from armless chair 5x in 31 sec; norm is 14.8 seconds  Ambulation/Gait Ambulation/Gait assistance: Supervision Ambulation Distance (Feet): 200 Feet Assistive device: Rolling walker (2 wheeled) Gait Pattern/deviations: Step-through pattern;Wide base of support;Decreased stride length Gait velocity: unmeasured, slowed   General Gait Details: short fast steps, minimal SOB but some increase; 2x ran into obstacles in periphery, otherwise seems to move ok, and is reluctant to use RW vs. cane.  Stairs            Wheelchair Mobility    Modified Rankin (Stroke Patients Only) Modified Rankin (Stroke Patients Only) Pre-Morbid Rankin Score: No significant disability Modified Rankin: No significant disability     Balance Overall balance assessment: Needs assistance Sitting-balance support: No upper extremity supported;Feet supported Sitting balance-Leahy Scale: Fair     Standing balance support: No upper extremity supported Standing balance-Leahy Scale: Fair Standing balance comment: see balance below... dependent on device, but able to furniture walk Single Leg Stance - Right Leg: 0 Single Leg Stance - Left Leg: 0 Tandem Stance - Right Leg: 0 Tandem Stance - Left Leg: 0 Rhomberg - Eyes Opened: 15                   Pertinent Vitals/Pain Pain Assessment: No/denies pain    Home Living Family/patient expects to be discharged to:: Private residence Living Arrangements: Spouse/significant other Available Help at Discharge: Family;Available 24 hours/day Type  of Home: House Home Access: Level entry     Home Layout: One level Home Equipment: Cane - single point;Shower seat Additional Comments: spouse says they dont' use the shower chair, may want tub transfer bench    Prior Function  Level of Independence: Independent with assistive device(s)         Comments: Using cane     Hand Dominance   Dominant Hand: Left    Extremity/Trunk Assessment   Upper Extremity Assessment Upper Extremity Assessment: Defer to OT evaluation    Lower Extremity Assessment Lower Extremity Assessment: Generalized weakness;Overall [facility] for tasks assessed    Cervical / Trunk Assessment Cervical / Trunk Assessment: Normal  Communication   Communication: HOH;No difficulties  Cognition Arousal/Alertness: Awake/alert Behavior During Therapy: WFL for tasks assessed/performed Overall Cognitive Status: Within Functional Limits for tasks assessed                                 General Comments: seems to be baseline according to spouse, with some problems following directions      General Comments      Exercises     Assessment/Plan    PT Assessment All further PT needs can be met in the next venue of care  PT Problem List Cardiopulmonary status limiting activity;Decreased mobility;Decreased balance;Decreased range of motion;Decreased cognition;Decreased knowledge of use of DME;Decreased safety awareness;Decreased knowledge of precautions       PT Treatment Interventions      PT Goals (Current goals can be found in the Care Plan section)  Acute Rehab PT Goals Patient Stated Goal: home PT Goal Formulation: All assessment and education complete, DC therapy    Frequency     Barriers to discharge        Co-evaluation               AM-PAC PT "6 Clicks" Daily Activity  Outcome Measure Difficulty turning over in bed (including adjusting bedclothes, sheets and blankets)?: A Little Difficulty moving from lying on back to sitting on the side of the bed? : A Little Difficulty sitting down on and standing up from a chair with arms (e.g., wheelchair, bedside commode, etc,.)?: A Little Help needed moving to and from a bed to chair (including a wheelchair)?: A  Little Help needed walking in hospital room?: A Little Help needed climbing 3-5 steps with a railing? : A Little 6 Click Score: 18    End of Session Equipment Utilized During Treatment: Gait belt Activity Tolerance: Patient tolerated treatment well Patient left: in bed;with call bell/phone within reach;with family/visitor present Nurse Communication: Mobility status PT Visit Diagnosis: Unsteadiness on feet (R26.81);Difficulty in walking, not elsewhere classified (R26.2)    Time: 1413-1440 PT Time Calculation (min) (ACUTE ONLY): 27 min   Charges:   PT Evaluation $PT Eval Moderate Complexity: 1 Procedure PT Treatments $Gait Training: 8-22 mins   PT G Codes:        Kearney Hard, PT, DPT, MS Board Certified Geriatric Specialist  Kearney Hard [facility] 04/05/2017, 2:51 PM

## 2017-04-05 NOTE — Consult Note (Signed)
Referring Physician: Dr. Hal Hope    Chief Complaint: AMS  HPI: [name] [name] is an 83 y.o. male who presented from home via EMS for AMS. His wife stated that he had been fatigued and diffusely weak for about 3-4 days PTA. Also developed confusion on Friday. Symptoms inculded dizziness, near syncope and decreased appetite. No speech deficit or focal weakness endorsed by his wife. The patient has a diagnosis of mild dementia per his wife.   MRI revealed a small acute infarction affecting the left basal ganglia extending into the radiating white matter tracts. Atrophy and chronic small vessel ischemic changes were also noted.   Past Medical History:  Diagnosis Date  . Anxiety   . Dementia   . Diabetes mellitus   . Hyperlipidemia   . Obesity   . Sleep apnea    uses CPAP at night to sleep    Past Surgical History:  Procedure Laterality Date  . HERNIA REPAIR Right   . REPLACEMENT TOTAL KNEE Left    Dr Marissa Nestle / Dr Collie Siad  . TRANSURETHRAL RESECTION OF PROSTATE      Family History  Problem Relation Age of Onset  . Liver disease Mother   . Cancer - Lung Father        smoker   Social History:  reports that he has never smoked. He has never used smokeless tobacco. He reports that he does not drink alcohol or use drugs.  Allergies:  Allergies  Allergen Reactions  . Donepezil Shortness Of Breath and Swelling  . Lisinopril Anaphylaxis, Shortness Of Breath and Swelling    HAD TO COME TO THE E.D. AFTER TAKING THIS!!  . Pearlean Brownie Hcl] Shortness Of Breath and Swelling  . Robaxin [Methocarbamol] Anaphylaxis, Shortness Of Breath and Swelling    HAD TO COME TO THE E.D. AFTER TAKING THIS!!    Medications:  Prior to Admission:  Prescriptions Prior to Admission  Medication Sig Dispense Refill Last Dose  . Cholecalciferol (VITAMIN D-3 PO) Take 1,000 Units by mouth daily.    04/03/2017 at am  . citalopram (CELEXA) 20 MG tablet TAKE 1 TABLET (20 MG TOTAL) BY MOUTH DAILY. 90  tablet 3 04/04/2017 at am  . co-enzyme Q-10 30 MG capsule Take 30 mg by mouth daily.   04/03/2017 at pm  . doxazosin (CARDURA) 4 MG tablet Take 4 mg by mouth daily.   0/86/5784 at pm  . folic acid (FOLVITE) 696 MCG tablet Take 800 mcg by mouth daily.   04/04/2017 at am  . insulin glargine (LANTUS SOLOSTAR) 100 UNIT/ML injection Inject 28 Units into the skin daily after supper.    04/03/2017 at pm  . loratadine-pseudoephedrine (CLARITIN-D 24-HOUR) 10-240 MG per 24 hr tablet Take 1 tablet by mouth daily as needed for allergies.   04/03/2017 at Unknown time  . metFORMIN (GLUCOPHAGE) 1000 MG tablet Take 1,000 mg by mouth 2 (two) times daily with a meal.   04/04/2017 at am  . Multiple Vitamin (MULTIVITAMIN WITH MINERALS) TABS Take 1 tablet by mouth daily.   04/04/2017 at am  . omeprazole (PRILOSEC) 40 MG capsule Take 40 mg by mouth daily.  0 04/04/2017 at am  . simvastatin (ZOCOR) 40 MG tablet Take 40 mg by mouth daily.  0 04/03/2017 at pm  . B-D ULTRAFINE III SHORT PEN 31G X 8 MM MISC   1 UNK at [facility]  . methotrexate 2.5 MG tablet Take 12.5 mg by mouth every Wednesday.    04/01/2017 at am  Scheduled: .  stroke: mapping our early stages of recovery book   Does not apply Once  . aspirin EC  325 mg Oral Daily  . benzonatate  100 mg Oral TID  . cholecalciferol  1,000 Units Oral Daily  . enoxaparin (LOVENOX) injection  40 mg Subcutaneous QHS  . guaiFENesin  600 mg Oral BID  . insulin aspart  0-5 Units Subcutaneous QHS  . insulin aspart  0-9 Units Subcutaneous TID WC  . insulin glargine  25 Units Subcutaneous QHS  . multivitamin with minerals  1 tablet Oral Daily  . pantoprazole  40 mg Oral Daily  . simvastatin  40 mg Oral Daily   Continuous: . sodium chloride 125 mL/hr at 04/04/17 1627  . sodium chloride 75 mL/hr at 04/04/17 1909  . azithromycin Stopped (04/04/17 2108)  . cefTRIAXone (ROCEPHIN)  IV     JQB:HALPFXTKWIOXB, albuterol, guaiFENesin-dextromethorphan  ROS: As per HPI.   Physical  Examination: Blood pressure 128/63, pulse 63, temperature 98.2 F (36.8 C), temperature source Oral, resp. rate 18, height _0  (1.753 m), weight 97.5 kg (215 lb), SpO2 100 %.  HEENT: [place]/AT Lungs: Occasional wet cough noted Ext: Warm and well-perfused  Neurologic Examination: Mental Status: Awake. Poor orientation. Able to follow all simple commands. Speech fluent with mild dysarthria. Able to name displayed thumb and pinky but not index finger. Able to correctly follow a directional 2-step command.  Cranial Nerves: II:  Visual fields grossly normal. Decreased central visual acuity OD. PERRL III,IV, VI: Ptosis not present, EOMI, no nystagmus V,VII: smile symmetric, facial light touch sensation intact bilaterally VIII: hearing intact to questions and commands IX,X: Mild hypophonia XI: Symmetric XII: midline tongue  Motor: Right : Upper extremity   4+/5    Left:     Upper extremity   5/5  Lower extremity   4+/5    Lower extremity   4+/5 Sensory: Light touch intact x 4. No extinction Deep Tendon Reflexes:  1+ bilateral brachioradialis. 0 patellae and achilles bilaterally.  Plantars: Right: downgoing  Left: downgoing Cerebellar: Slow FNF bilaterally without ataxia Gait: Deferred  Results for orders placed or performed during the hospital encounter of 04/04/17 (from the past 48 hour(s))  CBG monitoring, ED     Status: Abnormal   Collection Time: 04/04/17  2:36 PM  Result Value Ref Range   Glucose-Capillary 115 (H) 65 - 99 mg/dL  CBC with Differential     Status: Abnormal   Collection Time: 04/04/17  2:46 PM  Result Value Ref Range   WBC 6.2 4.0 - 10.5 K/uL   RBC 3.36 (L) 4.22 - 5.81 MIL/uL   Hemoglobin 10.7 (L) 13.0 - 17.0 g/dL   HCT 33.1 (L) 39.0 - 52.0 %   MCV 98.5 78.0 - 100.0 fL   MCH 31.8 26.0 - 34.0 pg   MCHC 32.3 30.0 - 36.0 g/dL   RDW 14.6 11.5 - 15.5 %   Platelets 211 150 - 400 K/uL   Neutrophils Relative % 60 %   Neutro Abs 3.7 1.7 - 7.7 K/uL   Lymphocytes  Relative 31 %   Lymphs Abs 2.0 0.7 - 4.0 K/uL   Monocytes Relative 8 %   Monocytes Absolute 0.5 0.1 - 1.0 K/uL   Eosinophils Relative 1 %   Eosinophils Absolute 0.1 0.0 - 0.7 K/uL   Basophils Relative 0 %   Basophils Absolute 0.0 0.0 - 0.1 K/uL  Comprehensive metabolic panel     Status: Abnormal   Collection Time: 04/04/17  2:46 PM  Result Value Ref Range   Sodium 135 135 - 145 mmol/L   Potassium 4.4 3.5 - 5.1 mmol/L   Chloride 103 101 - 111 mmol/L   CO2 24 22 - 32 mmol/L   Glucose, Bld 121 (H) 65 - 99 mg/dL   BUN 16 6 - 20 mg/dL   Creatinine, Ser 1.18 0.61 - 1.24 mg/dL   Calcium 8.7 (L) 8.9 - 10.3 mg/dL   Total Protein 6.2 (L) 6.5 - 8.1 g/dL   Albumin 3.5 3.5 - 5.0 g/dL   AST 18 15 - 41 U/L   ALT 15 (L) 17 - 63 U/L   Alkaline Phosphatase 46 38 - 126 U/L   Total Bilirubin 0.8 0.3 - 1.2 mg/dL   GFR calc non Af Amer 54 (L) >60 mL/min   GFR calc Af Amer >60 >60 mL/min    Comment: (NOTE) The eGFR has been calculated using the CKD EPI equation. This calculation has not been validated in all clinical situations. eGFR's persistently <60 mL/min signify possible Chronic Kidney Disease.    Anion gap 8 5 - 15  I-stat troponin, ED     Status: None   Collection Time: 04/04/17  2:48 PM  Result Value Ref Range   Troponin i, poc 0.00 0.00 - 0.08 ng/mL   Comment 3            Comment: Due to the release kinetics of cTnI, a negative result within the first hours of the onset of symptoms does not rule out myocardial infarction with certainty. If myocardial infarction is still suspected, repeat the test at appropriate intervals.   Urinalysis, Routine w reflex microscopic     Status: None   Collection Time: 04/04/17  4:39 PM  Result Value Ref Range   Color, Urine YELLOW YELLOW   APPearance CLEAR CLEAR   Specific Gravity, Urine 1.018 1.005 - 1.030   pH 5.0 5.0 - 8.0   Glucose, UA NEGATIVE NEGATIVE mg/dL   Hgb urine dipstick NEGATIVE NEGATIVE   Bilirubin Urine NEGATIVE NEGATIVE    Ketones, ur NEGATIVE NEGATIVE mg/dL   Protein, ur NEGATIVE NEGATIVE mg/dL   Nitrite NEGATIVE NEGATIVE   Leukocytes, UA NEGATIVE NEGATIVE  CBC     Status: Abnormal   Collection Time: 04/04/17  7:07 PM  Result Value Ref Range   WBC 6.7 4.0 - 10.5 K/uL   RBC 3.33 (L) 4.22 - 5.81 MIL/uL   Hemoglobin 10.7 (L) 13.0 - 17.0 g/dL   HCT 32.8 (L) 39.0 - 52.0 %   MCV 98.5 78.0 - 100.0 fL   MCH 32.1 26.0 - 34.0 pg   MCHC 32.6 30.0 - 36.0 g/dL   RDW 14.7 11.5 - 15.5 %   Platelets 208 150 - 400 K/uL  Creatinine, serum     Status: Abnormal   Collection Time: 04/04/17  7:07 PM  Result Value Ref Range   Creatinine, Ser 1.15 0.61 - 1.24 mg/dL   GFR calc non Af Amer 56 (L) >60 mL/min   GFR calc Af Amer >60 >60 mL/min    Comment: (NOTE) The eGFR has been calculated using the CKD EPI equation. This calculation has not been validated in all clinical situations. eGFR's persistently <60 mL/min signify possible Chronic Kidney Disease.   Glucose, capillary     Status: Abnormal   Collection Time: 04/04/17  7:14 PM  Result Value Ref Range   Glucose-Capillary 116 (H) 65 - 99 mg/dL  Strep pneumoniae urinary antigen  Status: None   Collection Time: 04/04/17  8:07 PM  Result Value Ref Range   Strep Pneumo Urinary Antigen NEGATIVE NEGATIVE    Comment:        Infection due to S. pneumoniae cannot be absolutely ruled out since the antigen present may be below the detection limit of the test.   Urinalysis, Complete w Microscopic     Status: Abnormal   Collection Time: 04/04/17  8:07 PM  Result Value Ref Range   Color, Urine YELLOW YELLOW   APPearance CLEAR CLEAR   Specific Gravity, Urine 1.014 1.005 - 1.030   pH 5.0 5.0 - 8.0   Glucose, UA NEGATIVE NEGATIVE mg/dL   Hgb urine dipstick NEGATIVE NEGATIVE   Bilirubin Urine NEGATIVE NEGATIVE   Ketones, ur NEGATIVE NEGATIVE mg/dL   Protein, ur NEGATIVE NEGATIVE mg/dL   Nitrite NEGATIVE NEGATIVE   Leukocytes, UA NEGATIVE NEGATIVE   RBC / HPF 0-5 0 -  5 RBC/hpf   WBC, UA 0-5 0 - 5 WBC/hpf   Bacteria, UA NONE SEEN NONE SEEN   Squamous Epithelial / LPF 0-5 (A) NONE SEEN   Mucous PRESENT   Glucose, capillary     Status: Abnormal   Collection Time: 04/04/17  9:29 PM  Result Value Ref Range   Glucose-Capillary 191 (H) 65 - 99 mg/dL  Lipid panel     Status: None   Collection Time: 04/05/17  5:51 AM  Result Value Ref Range   Cholesterol 128 0 - 200 mg/dL   Triglycerides 99 <150 mg/dL   HDL 51 >40 mg/dL   Total CHOL/HDL Ratio 2.5 RATIO   VLDL 20 0 - 40 mg/dL   LDL Cholesterol 57 0 - 99 mg/dL    Comment:        Total Cholesterol/HDL:CHD Risk Coronary Heart Disease Risk Table                     Men   Women  1/2 Average Risk   3.4   3.3  Average Risk       5.0   4.4  2 X Average Risk   9.6   7.1  3 X Average Risk  23.4   11.0        Use the calculated Patient Ratio above and the CHD Risk Table to determine the patient's CHD Risk.        ATP III CLASSIFICATION (LDL):  <100     mg/dL   Optimal  100-129  mg/dL   Near or Above                    Optimal  130-159  mg/dL   Borderline  160-189  mg/dL   High  >190     mg/dL   Very High   Basic metabolic panel     Status: Abnormal   Collection Time: 04/05/17  5:51 AM  Result Value Ref Range   Sodium 139 135 - 145 mmol/L   Potassium 4.7 3.5 - 5.1 mmol/L   Chloride 107 101 - 111 mmol/L   CO2 23 22 - 32 mmol/L   Glucose, Bld 125 (H) 65 - 99 mg/dL   BUN 14 6 - 20 mg/dL   Creatinine, Ser 1.05 0.61 - 1.24 mg/dL   Calcium 8.7 (L) 8.9 - 10.3 mg/dL   GFR calc non Af Amer >60 >60 mL/min   GFR calc Af Amer >60 >60 mL/min    Comment: (NOTE) The eGFR has been  calculated using the CKD EPI equation. This calculation has not been validated in all clinical situations. eGFR's persistently <60 mL/min signify possible Chronic Kidney Disease.    Anion gap 9 5 - 15  CBC     Status: Abnormal   Collection Time: 04/05/17  5:51 AM  Result Value Ref Range   WBC 7.2 4.0 - 10.5 K/uL   RBC 3.40 (L)  4.22 - 5.81 MIL/uL   Hemoglobin 10.9 (L) 13.0 - 17.0 g/dL   HCT 33.6 (L) 39.0 - 52.0 %   MCV 98.8 78.0 - 100.0 fL   MCH 32.1 26.0 - 34.0 pg   MCHC 32.4 30.0 - 36.0 g/dL   RDW 15.2 11.5 - 15.5 %   Platelets 206 150 - 400 K/uL  Glucose, capillary     Status: Abnormal   Collection Time: 04/05/17  7:41 AM  Result Value Ref Range   Glucose-Capillary 126 (H) 65 - 99 mg/dL   Dg Chest 2 View  Result Date: 04/04/2017 CLINICAL DATA:  Cough. EXAM: CHEST  2 VIEW COMPARISON:  No comparison studies available. FINDINGS: AP and lateral views of the chest show enlargement of the cardiopericardial silhouette. No focal airspace consolidation. Interstitial markings are diffusely coarsened with chronic features. There is some trace left basilar opacity which may be related atelectasis or pneumonia. No pleural effusion. Degenerative changes noted in both shoulders. Telemetry leads overlie the chest. IMPRESSION: Cardiomegaly with underlying chronic interstitial lung disease. Left base atelectasis or pneumonia. Electronically Signed   By: Misty Stanley M.D.   On: 04/04/2017 15:10   Ct Head Wo Contrast  Result Date: 04/04/2017 CLINICAL DATA:  Altered mental status near syncopal episode EXAM: CT HEAD WITHOUT CONTRAST TECHNIQUE: Contiguous axial images were obtained from the base of the skull through the vertex without intravenous contrast. COMPARISON:  MRI 04/30/2012 FINDINGS: Brain: No large territorial infarction, hemorrhage or intracranial mass is seen. Mild hypodensity in the left basal ganglia. Mild hypodensity in the white matter consistent with small vessel ischemic changes. Moderate atrophy. Ventricles within normal limits given atrophy. Vascular: No hyperdense vessels.  Carotid artery calcifications. Skull: No fracture. Sinuses/Orbits: Mucosal thickening in the maxillary sphenoid and ethmoid sinuses. Completely opacified left frontal sinus. Bony remodeling suggests chronic sinusitis. No acute orbital abnormality.  Other: None IMPRESSION: 1. Vague hypodensity within the left basal ganglia, could relate to a age indeterminate lacunar infarct. There are otherwise no acute intracranial abnormality seen. 2. Small vessel ischemic changes of the white matter.  Atrophy. 3. Pan sinusitis Electronically Signed   By: Donavan Foil M.D.   On: 04/04/2017 16:40   Mr Brain Wo Contrast  Result Date: 04/04/2017 CLINICAL DATA:  Diabetes and dementia. Mental status changes over the last few days with weakness. Lethargy. Confusion. EXAM: MRI HEAD WITHOUT CONTRAST TECHNIQUE: Multiplanar, multiecho pulse sequences of the brain and surrounding structures were obtained without intravenous contrast. COMPARISON:  CT same day.  MRI 04/30/2012. FINDINGS: Brain: Diffusion imaging shows acute infarction in the left basal ganglia extending into the radiating white matter tracts. No other acute infarction. No mass effect or hemorrhage. Brainstem appears normal. Few old small vessel cerebellar infarctions. Atrophy of the cerebral hemispheres with mild chronic small-vessel changes of the deep white matter and subcortical white matter. No hydrocephalus or extra-axial collection. Vascular: Major vessels at the base of the brain show flow. Skull and upper cervical spine: Negative Sinuses/Orbits: Inflammatory changes of the paranasal sinuses with opacification of the left frontal sinus. Orbits negative. Other: None significant IMPRESSION: MRI  confirms the CT suggested acute infarction affecting the left basal ganglia extending into the radiating white matter tracts. No hemorrhage or mass effect. Atrophy and chronic small vessel ischemic changes elsewhere. Sinusitis. Electronically Signed   By: Nelson Chimes M.D.   On: 04/04/2017 19:02    Assessment: 83 y.o. male with small subacute left basal ganglia ischemic infarction 1. MRI confirms the CT suggested small acute infarction affecting the left basal ganglia extending into the radiating white matter tracts.  Atrophy and chronic small vessel ischemic changes are also noted. 2. Stroke Risk Factors - DM and HLD 3. Mild dementia  Plan: 1. HgbA1c, fasting lipid panel 2. MRA of the brain without contrast 3. PT consult, OT consult, Speech consult 4. Echocardiogram 5. Carotid dopplers 6. Agree with starting ASA.  7. Continue Zocor.  8. Telemetry monitoring 9. Frequent neuro checks  _0  signed: Dr. Kerney Elbe  04/05/2017, 7:48 AM

## 2017-04-05 NOTE — Evaluation (Signed)
Occupational Therapy Evaluation Patient Details Name: [name] FarmCharles [name] Zeringue MRN: [id number] DOB: March 14, 1929 Today's Date: 04/05/2017    History of Present Illness [name] [name] is an 83 y.o. male who presented from home via EMS for AMS. His wife stated that he had been fatigued and diffusely weak for about 3-4 days PTA. Also developed confusion on Friday 7/20. Symptoms inculded dizziness, near syncope and decreased appetite. No speech deficit or focal weakness endorsed by his wife. The patient has a diagnosis of mild dementia per his wife. MRI revealed a small acute infarction affecting the left basal ganglia extending into the radiating white matter tracts. Atrophy and chronic small vessel ischemic changes were also noted. CXR showed Left base atelectasis or pneumonia.   Clinical Impression   PTA, pt reports that he was able to complete basic ADL independently. However, per pt's wife, pt required occasional assistance with dressing tasks. Pt currently requires fluctuating assist from min guard to min during toilet transfers with RW and min assist for LB ADL. He presents with some confusion (likely close to baseline per family), generalized weakness, and decreased activity tolerance for ADL impacting [facility] ability [facility]. He additionally moved quickly with decreased safety awareness making him unsafe during OT evaluation. Pt and wife adamantly want to go home and decline SNF placement although feel this would be the most beneficial venue for rehabilitation. As such, recommend maximum home health services including OT and aide to improve safety and independence with ADL and functional mobility. Will continue to follow while admitted.      Follow Up Recommendations  Home health OT (Pt adamantly refuses SNF; Will need maximum HH services)    Equipment Recommendations  Tub/shower bench    Recommendations for Other Services       Precautions / Restrictions  Precautions Precautions: Fall Precaution Comments: bed alarm; up with A, use RW Restrictions Weight Bearing Restrictions: No      Mobility Bed Mobility Overal bed mobility: Needs Assistance Bed Mobility: Supine to Sit     Supine to sit: Supervision     General bed mobility comments: Supervision for safety.   Transfers Overall transfer level: Needs assistance Equipment used: Rolling walker (2 wheeled) Transfers: Sit to/from UGI CorporationStand;Stand Pivot Transfers Sit to Stand: Min guard;Min assist Stand pivot transfers: Min guard;Min assist       General transfer comment: Fluctuating assistance required to maximize safety.     Balance Overall balance assessment: Needs assistance Sitting-balance support: No upper extremity supported;Feet supported Sitting balance-Leahy Scale: Fair     Standing balance support: No upper extremity supported;During functional activity Standing balance-Leahy Scale: Fair Standing balance comment: Able to statically stand without UE support.  Single Leg Stance - Right Leg: 0 Single Leg Stance - Left Leg: 0 Tandem Stance - Right Leg: 0 Tandem Stance - Left Leg: 0 [place] - Eyes Opened: 15                 ADL either performed or assessed with clinical judgement   ADL Overall ADL's : Needs assistance/impaired Eating/Feeding: Supervision/ safety;Sitting   Grooming: Min guard;Standing   Upper Body Bathing: Supervision/ safety;Sitting   Lower Body Bathing: Minimal assistance;Sit to/from stand   Upper Body Dressing : Supervision/safety;Sitting   Lower Body Dressing: Minimal assistance;Sit to/from stand   Toilet Transfer: Min guard;Minimal assistance;Ambulation;BSC;RW   Toileting- ArchitectClothing Manipulation and Hygiene: Min guard;Sit to/from stand       Functional mobility during ADLs: Min guard;Minimal assistance;Rolling walker General ADL  Comments: Fluctuating assist as pt moving quickly and unsafely with RW with difficulty safely following  directions.      Vision Baseline Vision/History: Retinopathy Patient Visual Report:  (Retinopathy in L eye) Additional Comments: Able to track with therapist and utilize central and peripheral vision functionally.      Perception     Praxis      Pertinent Vitals/Pain Pain Assessment: No/denies pain     Hand Dominance Left   Extremity/Trunk Assessment Upper Extremity Assessment Upper Extremity Assessment: Generalized weakness   Lower Extremity Assessment Lower Extremity Assessment: Generalized weakness   Cervical / Trunk Assessment Cervical / Trunk Assessment: Normal   Communication Communication Communication: HOH;No difficulties   Cognition Arousal/Alertness: Awake/alert Behavior During Therapy: WFL for tasks assessed/performed Overall Cognitive Status: History of cognitive impairments - at baseline                                 General Comments: Pt with some confusion throughout session but per wife he is at baseline.    General Comments  Wife present during session.     Exercises     Shoulder Instructions      Home Living Family/patient expects to be discharged to:: Private residence Living Arrangements: Spouse/significant other Available Help at Discharge: Family;Available 24 hours/day Type of Home: House Home Access: Level entry     Home Layout: One level     Bathroom Shower/Tub: Chief Strategy Officer: Standard     Home Equipment: Cane - single point;Shower seat   Additional Comments: spouse says they dont' use the shower chair, may want tub transfer bench  Lives With: Spouse    Prior Functioning/Environment Level of Independence: Independent with assistive device(s)        Comments: Using cane        OT Problem List: Decreased strength;Decreased activity tolerance;Impaired balance (sitting and/or standing);Decreased safety awareness;Decreased knowledge of use of DME or AE;Decreased knowledge of precautions       OT Treatment/Interventions: Self-care/ADL training;Therapeutic exercise;Energy conservation;DME and/or AE instruction;Therapeutic activities;Patient/family education;Balance training;Cognitive remediation/compensation    OT Goals(Current goals can be found in the care plan section) Acute Rehab OT Goals Patient Stated Goal: home OT Goal Formulation: With patient/family Time For Goal Achievement: 04/19/17 Potential to Achieve Goals: Good ADL Goals Pt Will Perform Grooming: with modified independence;standing Pt Will Perform Lower Body Dressing: with modified independence;sit to/from stand Pt Will Transfer to Toilet: with modified independence;ambulating;bedside commode Pt Will Perform Toileting - Clothing Manipulation and hygiene: with modified independence;sit to/from stand Additional ADL Goal #1: Pt will follow 4/5 multi-step commands during seated grooming tasks with no more than 1 VC.   OT Frequency: Min 2X/week   Barriers to D/C:            Co-evaluation              AM-PAC PT "6 Clicks" Daily Activity     Outcome Measure Help from another person eating meals?: None Help from another person taking care of personal grooming?: A Little Help from another person toileting, which includes using toliet, bedpan, or urinal?: A Little Help from another person bathing (including washing, rinsing, drying)?: A Little Help from another person to put on and taking off regular upper body clothing?: A Little Help from another person to put on and taking off regular lower body clothing?: A Little 6 Click Score: 19   End of Session Equipment Utilized During  Treatment: Gait belt;Rolling walker Nurse Communication: Mobility status  Activity Tolerance: Patient tolerated treatment well Patient left: in chair;with call bell/phone within reach;with family/visitor present  OT Visit Diagnosis: Unsteadiness on feet (R26.81);Muscle weakness (generalized) (M62.81)                Time:  1610-9604 OT Time Calculation (min): 52 min Charges:  OT General Charges $OT Visit: 1 Procedure OT Evaluation $OT Eval Moderate Complexity: 1 Procedure OT Treatments $Self Care/Home Management : 23-37 mins G-Codes:     Doristine Section, MS OTR/L  Pager: [phone]   Andreyah Natividad A Lian Tanori 04/05/2017, 3:55 PM

## 2017-04-05 NOTE — Progress Notes (Signed)
Dr. Toniann FailKakrakandy notified that patient MRI results stated as follows: MRI confirms the CT suggested acute infarction affecting the left basal ganglia extending into the radiating white matter tracts; No hemorrhage or mass effect.  No code stroke initiated per MD.  Stroke work up orders in place; patient and family education done.  Dr. Toniann FailKakrakandy spoke personally with wife over the phone explaining plan of care.  NIH score 1.  Will continue to monitor patient.

## 2017-04-05 NOTE — Progress Notes (Addendum)
*  PRELIMINARY RESULTS* Vascular Ultrasound Carotid Duplex (Doppler) has been completed.  Preliminary findings: Bilateral: No significant (1-39%) ICA stenosis. Antegrade vertebral flow.   Transcranial Doppler completed.    Farrel DemarkJill Eunice, RDMS, RVT  04/05/2017, 10:17 AM

## 2017-04-05 NOTE — Progress Notes (Signed)
STROKE TEAM PROGRESS NOTE   HISTORY OF PRESENT ILLNESS (per record) [name] [name] is an 83 y.o. male who presented from home via EMS for AMS. His wife stated that he had been fatigued and diffusely weak for about 3-4 days PTA. Also developed confusion on Friday. Symptoms inculded dizziness, near syncope and decreased appetite. No speech deficit or focal weakness endorsed by his wife. The patient has a diagnosis of mild dementia per his wife.   MRI revealed a small acute infarction affecting the left basal ganglia extending into the radiating white matter tracts. Atrophy and chronic small vessel ischemic changes were also noted.     SUBJECTIVE (INTERVAL HISTORY) The patient's wife and 2 daughters were at the bedside. They report that the patient is much improved.   OBJECTIVE Temp:  [98.2 F (36.8 C)-98.4 F (36.9 C)] 98.2 F (36.8 C) (07/22 0601) Pulse Rate:  [53-95] 63 (07/22 0601) Cardiac Rhythm: Heart block;Sinus bradycardia (07/22 0700) Resp:  [15-25] 18 (07/22 0601) BP: (117-163)/(42-97) 128/63 (07/22 0601) SpO2:  [96 %-100 %] 100 % (07/22 0601) Weight:  [97.5 kg (215 lb)] 97.5 kg (215 lb) (07/21 1405)  CBC:   Recent Labs Lab 04/04/17 1446 04/04/17 1907 04/05/17 0551  WBC 6.2 6.7 7.2  NEUTROABS 3.7  --   --   HGB 10.7* 10.7* 10.9*  HCT 33.1* 32.8* 33.6*  MCV 98.5 98.5 98.8  PLT 211 208 206    Basic Metabolic Panel:   Recent Labs Lab 04/04/17 1446 04/04/17 1907 04/05/17 0551  NA 135  --  139  K 4.4  --  4.7  CL 103  --  107  CO2 24  --  23  GLUCOSE 121*  --  125*  BUN 16  --  14  CREATININE 1.18 1.15 1.05  CALCIUM 8.7*  --  8.7*    Lipid Panel:     Component Value Date/Time   CHOL 128 04/05/2017 0551   TRIG 99 04/05/2017 0551   HDL 51 04/05/2017 0551   CHOLHDL 2.5 04/05/2017 0551   VLDL 20 04/05/2017 0551   LDLCALC 57 04/05/2017 0551   HgbA1c: No results found for: HGBA1C Urine Drug Screen: No results found for: LABOPIA, COCAINSCRNUR,  LABBENZ, AMPHETMU, THCU, LABBARB  Alcohol Level No results found for: [facility]  [facility]   Dg Chest 2 View 04/04/2017 Cardiomegaly with underlying chronic interstitial lung disease. Left base atelectasis or pneumonia.    Ct Head Wo Contrast 04/04/2017 1. Vague hypodensity within the left basal ganglia, could relate to a age indeterminate lacunar infarct. There are otherwise no acute intracranial abnormality seen.  2. Small vessel ischemic changes of the white matter.  Atrophy.  3. Pan sinusitis    Mr Brain Wo Contrast 04/04/2017 MRI confirms the CT suggested acute infarction affecting the left basal ganglia extending into the radiating white matter tracts. No hemorrhage or mass effect. Atrophy and chronic small vessel ischemic changes elsewhere. Sinusitis.     PHYSICAL EXAM Pleasant elderly Caucasian male currently not in distress.  . Afebrile. Head is nontraumatic. Neck is supple without bruit.    Cardiac exam no murmur or gallop. Lungs are clear to auscultation. Distal pulses are well felt.  Neurological Exam ;  Awake  Alert oriented x 3. Slightly hesitant speech with dysfluency but no dysarthria.eye movements full without nystagmus.fundi were not visualized. Vision acuity and fields appear normal. Hearing is normal. Palatal movements are normal. Face symmetric. Tongue midline. Normal strength, tone, reflexes and coordination. Normal sensation. Gait deferred.  ASSESSMENT/PLAN Mr. [name] [name] is a 83 y.o. male with history of  obstructive sleep apnea, obesity, hyperlipidemia, diabetes mellitus, and dementia presenting with weakness, presyncope, altered mental status, and dizziness. He did not receive IV t-PA due to unknown time of onset.  Stroke:  left basal ganglia infarct - secondary to small vessel disease  Resultant  Confusion and speech difficulty  CT head - vague hypodensity within the left basal ganglia,  MRI head - acute infarction affecting the left basal  ganglia extending into the radiating white matter tracts.  MRA head - not performed  Carotid Doppler - unremarkable  2D Echo - pending  Transcranial Dopplers - pending  BLE Dopplers - negative for DVT  LDL - 57  HgbA1c pending  VTE prophylaxis - Lovenox Diet NPO time specified  No antithrombotic prior to admission, now on aspirin 325 mg daily  Patient counseled to be compliant with his antithrombotic medications  Ongoing aggressive stroke risk factor management  Therapy recommendations: Home health PT recommended - OT evaluation pending  Disposition: Pending  Hypertension  Stable  Permissive hypertension (OK if < 220/120) but gradually normalize in 5-7 days  Long-term BP goal normotensive  Hyperlipidemia  Home meds:  Zocor 40 mg daily resumed in hospital  LDL 57, goal < 70  Continue statin at discharge  Diabetes  HgbA1c pending, goal < 7.0  Controlled  Other Stroke Risk Factors  Advanced age  Obesity, Body mass index is 31.75 kg/m., recommend weight loss, diet and exercise as appropriate   Obstructive sleep apnea, on CPAP at home  Other Active Problems  Dementia  Anemia  Pneumonia - Zithromax and Rocephin started 04/04/2017  Pan sinusitis on head CT  Hospital day # 1  Delton See PA-C [facility] Pager [phone] 04/05/2017, 3:29 PM I have personally examined this patient, reviewed notes, independently viewed imaging studies, participated in medical decision making and plan of care.ROS completed by me personally and pertinent positives fully documented  I have made any additions or clarifications directly to the above note. Agree with note above.  He presented with transient confusion which appears to be improving from small left basal ganglia infarct from small vessel disease. Continue aspirin and ongoing stroke workup.long discussion at the bedside with the patient and wife and  answered questions.greater than 50% time  during this 35 minute visit was spent on counseling and coordination of care about his lacunar infarct, stroke prevention and treatment discussion  Delia Heady, MD Medical Director [facility] Pager: [phone] 04/05/2017 4:13 PM   To contact Stroke Continuity provider, please refer to [email or web address]. After hours, contact General Neurology

## 2017-04-05 NOTE — Progress Notes (Signed)
[facility]                                                                              Patient Demographics  [name] [name], is a 83 y.o. male, DOB - [birth year], ION:[id number]  Admit date - 04/04/2017   Admitting Physician [name], MD  Outpatient Primary MD for the patient is [name], David, MD  Outpatient specialists:   LOS - 1  days   Medical records reviewed and are as summarized below:    Chief Complaint  Patient presents with  . Altered Mental Status       Brief summary   Patient is a 83 year old male with diabetes mellitus, hyperlipidemia, dementia presented from home via EMS. Patient and his wife reported that he has not been himself for last few days, feeling fatigued and weak for the last 3-4 days. He has been coughing with chills, lethargic. The day before the admission, he was having confusion, subsequently more dizzy and confused on the day of admission. The patient is a poor historian and has dementia hence difficult to obtain review of systems from the patient. Patient's wife did not notice any focal weakness, difficulty speaking or eating. Patient also had a near syncopal episode today due to weakness. He has not been eating well for the last 2 days.   Assessment & Plan   Principal Problem:   CAP (community acquired pneumonia) presenting with coughing, chills, generalized weakness - Chest x-ray showed left lung infiltrates.  -Continue IV Zithromax, Rocephin  -Follow blood cultures, sputum cultures - Urine strep antigen negative, follow urine legionella antigen  - Continue antitussives, albuterol nebs as needed, Mucinex  Active Problems:    Acute metabolic encephalopathy likely due to acute CVA - CT head showed vague hypodensity in the left basal ganglia, could relate to age indeterminate lacunar infarct - MRI showed acute infarction affecting the left basal ganglia extending into the radiating white matter tracts, no hemorrhage or  mass effect - Neurology consulted, started stroke workup - 2-D echo, carotid Dopplers, ? CT angiogram head and neck - Aspirin 325 mg daily, continue statin - LDL 57 - Hemoglobin A1c in process - PT OT, ST consults    Diabetes mellitus (HCC) - Placed on Lantus 25 units at bedtime, sensitive sliding scale insulin - Follow hemoglobin A1c - Hold metformin    Hyperlipidemia - LDL 57, continue Zocor   Code Status: Full CODE STATUS DVT Prophylaxis:  Lovenox  Family Communication: Discussed in detail with the patient, all imaging results, lab results explained to the patient and wife at the bedside   Disposition Plan: Pending PT eval  Time Spent in minutes 25 minutes  Procedures:   MRI brain  Consultants:    Neurology  Antimicrobials:    IV Zithromax 7/21   IV Rocephin 7/21   Medications  Scheduled Meds: .  stroke: mapping our early stages of recovery book   Does not apply Once  . aspirin EC  325 mg Oral Daily  . benzonatate  100 mg Oral TID  . cholecalciferol  1,000 Units Oral Daily  . enoxaparin (LOVENOX) injection  40 mg Subcutaneous  QHS  . guaiFENesin  600 mg Oral BID  . insulin aspart  0-5 Units Subcutaneous QHS  . insulin aspart  0-9 Units Subcutaneous TID WC  . insulin glargine  25 Units Subcutaneous QHS  . multivitamin with minerals  1 tablet Oral Daily  . pantoprazole  40 mg Oral Daily  . simvastatin  40 mg Oral Daily   Continuous Infusions: . sodium chloride 125 mL/hr at 04/04/17 1627  . sodium chloride 75 mL/hr at 04/04/17 1909  . azithromycin Stopped (04/04/17 2108)  . cefTRIAXone (ROCEPHIN)  IV     PRN Meds:.acetaminophen, albuterol, guaiFENesin-dextromethorphan   Antibiotics   Anti-infectives    Start     Dose/Rate Route Frequency Ordered Stop   04/05/17 1500  cefTRIAXone (ROCEPHIN) 1 g in dextrose 5 % 50 mL IVPB     1 g 100 mL/hr over 30 Minutes Intravenous Every 24 hours 04/04/17 1849 04/12/17 1459   04/04/17 1900  azithromycin  (ZITHROMAX) 500 mg in dextrose 5 % 250 mL IVPB     500 mg 250 mL/hr over 60 Minutes Intravenous Every 24 hours 04/04/17 1849 04/11/17 1859   04/04/17 1530  cefTRIAXone (ROCEPHIN) 1 g in dextrose 5 % 50 mL IVPB     1 g 100 mL/hr over 30 Minutes Intravenous  Once 04/04/17 1518 04/04/17 1710   04/04/17 1530  doxycycline (VIBRA-TABS) tablet 100 mg     100 mg Oral  Once 04/04/17 1518 04/04/17 1627        Subjective:   Kenichi Cassada was seen and examined today.  Feeling slightly better this morning still having cough overnight. Still feeling weak. Patient denies dizziness, chest pain,  abdominal pain, N/V/D/C.   Objective:   Vitals:   04/04/17 2132 04/04/17 2258 04/05/17 0112 04/05/17 0601  BP: 127/69 (!) 142/95 130/73 128/63  Pulse: 80 67 68 63  Resp: 18 18 18 18   Temp: 98.4 F (36.9 C) 98.3 F (36.8 C) 98.3 F (36.8 C) 98.2 F (36.8 C)  TempSrc: Oral Oral Oral Oral  SpO2: 97% 100% 100% 100%  Weight:      Height:        Intake/Output Summary (Last 24 hours) at 04/05/17 1000 Last data filed at 04/05/17 0511  Gross per 24 hour  Intake                0 ml  Output              300 ml  Net             -300 ml     Wt Readings from Last 3 Encounters:  04/04/17 97.5 kg (215 lb)  03/09/17 98.7 kg (217 lb 8 oz)  10/31/16 99.3 kg (219 lb)     Exam  General: Alert and oriented x 3, NAD  Eyes:   HEENT:  Atraumatic, normocephalic  Cardiovascular: S1 S2 auscultated, no rubs, murmurs or gallops. Regular rate and rhythm.  Respiratory: Decreased breath sounds at the bases with scattered rhonchi  Gastrointestinal: Soft, nontender, nondistended, + bowel sounds  Ext: no pedal edema bilaterally  Neuro: Global weakness   Musculoskeletal: No digital cyanosis, clubbing  Skin: No rashes  Psych: Normal affect and demeanor, alert and oriented x3    Data Reviewed:  I have personally reviewed following labs and imaging studies  Micro Results No results found for this or  any previous visit (from the past 240 hour(s)).  Radiology Reports Dg Chest 2 View  Result Date: 04/04/2017  CLINICAL DATA:  Cough. EXAM: CHEST  2 VIEW COMPARISON:  No comparison studies available. FINDINGS: AP and lateral views of the chest show enlargement of the cardiopericardial silhouette. No focal airspace consolidation. Interstitial markings are diffusely coarsened with chronic features. There is some trace left basilar opacity which may be related atelectasis or pneumonia. No pleural effusion. Degenerative changes noted in both shoulders. Telemetry leads overlie the chest. IMPRESSION: Cardiomegaly with underlying chronic interstitial lung disease. Left base atelectasis or pneumonia. Electronically Signed   By: Kennith Center M.D.   On: 04/04/2017 15:10   Ct Head Wo Contrast  Result Date: 04/04/2017 CLINICAL DATA:  Altered mental status near syncopal episode EXAM: CT HEAD WITHOUT CONTRAST TECHNIQUE: Contiguous axial images were obtained from the base of the skull through the vertex without intravenous contrast. COMPARISON:  MRI 04/30/2012 FINDINGS: Brain: No large territorial infarction, hemorrhage or intracranial mass is seen. Mild hypodensity in the left basal ganglia. Mild hypodensity in the white matter consistent with small vessel ischemic changes. Moderate atrophy. Ventricles within normal limits given atrophy. Vascular: No hyperdense vessels.  Carotid artery calcifications. Skull: No fracture. Sinuses/Orbits: Mucosal thickening in the maxillary sphenoid and ethmoid sinuses. Completely opacified left frontal sinus. Bony remodeling suggests chronic sinusitis. No acute orbital abnormality. Other: None IMPRESSION: 1. Vague hypodensity within the left basal ganglia, could relate to a age indeterminate lacunar infarct. There are otherwise no acute intracranial abnormality seen. 2. Small vessel ischemic changes of the white matter.  Atrophy. 3. Pan sinusitis Electronically Signed   By: Jasmine Pang  M.D.   On: 04/04/2017 16:40   Mr Brain Wo Contrast  Result Date: 04/04/2017 CLINICAL DATA:  Diabetes and dementia. Mental status changes over the last few days with weakness. Lethargy. Confusion. EXAM: MRI HEAD WITHOUT CONTRAST TECHNIQUE: Multiplanar, multiecho pulse sequences of the brain and surrounding structures were obtained without intravenous contrast. COMPARISON:  CT same day.  MRI 04/30/2012. FINDINGS: Brain: Diffusion imaging shows acute infarction in the left basal ganglia extending into the radiating white matter tracts. No other acute infarction. No mass effect or hemorrhage. Brainstem appears normal. Few old small vessel cerebellar infarctions. Atrophy of the cerebral hemispheres with mild chronic small-vessel changes of the deep white matter and subcortical white matter. No hydrocephalus or extra-axial collection. Vascular: Major vessels at the base of the brain show flow. Skull and upper cervical spine: Negative Sinuses/Orbits: Inflammatory changes of the paranasal sinuses with opacification of the left frontal sinus. Orbits negative. Other: None significant IMPRESSION: MRI confirms the CT suggested acute infarction affecting the left basal ganglia extending into the radiating white matter tracts. No hemorrhage or mass effect. Atrophy and chronic small vessel ischemic changes elsewhere. Sinusitis. Electronically Signed   By: Paulina Fusi M.D.   On: 04/04/2017 19:02    Lab Data:  CBC:  Recent Labs Lab 04/04/17 1446 04/04/17 1907 04/05/17 0551  WBC 6.2 6.7 7.2  NEUTROABS 3.7  --   --   HGB 10.7* 10.7* 10.9*  HCT 33.1* 32.8* 33.6*  MCV 98.5 98.5 98.8  PLT 211 208 206   Basic Metabolic Panel:  Recent Labs Lab 04/04/17 1446 04/04/17 1907 04/05/17 0551  NA 135  --  139  K 4.4  --  4.7  CL 103  --  107  CO2 24  --  23  GLUCOSE 121*  --  125*  BUN 16  --  14  CREATININE 1.18 1.15 1.05  CALCIUM 8.7*  --  8.7*   GFR: Estimated Creatinine Clearance:  58.1 mL/min (by C-G  formula based on SCr of 1.05 mg/dL). Liver Function Tests:  Recent Labs Lab 04/04/17 1446  AST 18  ALT 15*  ALKPHOS 46  BILITOT 0.8  PROT 6.2*  ALBUMIN 3.5   No results for input(s): LIPASE, AMYLASE in the last 168 hours. No results for input(s): AMMONIA in the last 168 hours. Coagulation Profile: No results for input(s): INR, PROTIME in the last 168 hours. Cardiac Enzymes: No results for input(s): CKTOTAL, CKMB, CKMBINDEX, TROPONINI in the last 168 hours. BNP (last 3 results) No results for input(s): PROBNP in the last 8760 hours. HbA1C: No results for input(s): HGBA1C in the last 72 hours. CBG:  Recent Labs Lab 04/04/17 1436 04/04/17 1914 04/04/17 2129 04/05/17 0741  GLUCAP 115* 116* 191* 126*   Lipid Profile:  Recent Labs  04/05/17 0551  CHOL 128  HDL 51  LDLCALC 57  TRIG 99  CHOLHDL 2.5   Thyroid Function Tests: No results for input(s): TSH, T4TOTAL, FREET4, T3FREE, THYROIDAB in the last 72 hours. Anemia Panel: No results for input(s): VITAMINB12, FOLATE, FERRITIN, TIBC, IRON, RETICCTPCT in the last 72 hours. Urine analysis:    Component Value Date/Time   COLORURINE YELLOW 04/04/2017 2007   APPEARANCEUR CLEAR 04/04/2017 2007   LABSPEC 1.014 04/04/2017 2007   PHURINE 5.0 04/04/2017 2007   GLUCOSEU NEGATIVE 04/04/2017 2007   HGBUR NEGATIVE 04/04/2017 2007   BILIRUBINUR NEGATIVE 04/04/2017 2007   KETONESUR NEGATIVE 04/04/2017 2007   PROTEINUR NEGATIVE 04/04/2017 2007   NITRITE NEGATIVE 04/04/2017 2007   LEUKOCYTESUR NEGATIVE 04/04/2017 2007     Solstice Lastinger M.D. [facility] 04/05/2017, 10:00 AM  Pager: [phone] Between 7am to 7pm - call Pager - [phone]  After 7pm go to [email or web address] - password [email or web address]  Call night coverage person covering after 7pm

## 2017-04-05 NOTE — Progress Notes (Signed)
  Echocardiogram 2D Echocardiogram has been performed.  Delcie RochENNINGTON, Lisseth Brazeau 04/05/2017, 5:01 PM

## 2017-04-05 NOTE — Progress Notes (Signed)
SLP Cancellation Note  Patient Details Name: Garnett FarmCharles [name] Szwed MRN: [id number] DOB: August 15, 1929   Cancelled treatment:       Reason Eval/Treat Not Completed: Patient at procedure or test/unavailable. Will f/u as schedule allows.  Rondel BatonMary Beth Jawaun Celmer, [place], CCC-SLP Speech-Language Pathologist [phone]   Arlana LindauMary E Traniece Boffa 04/05/2017, 9:56 AM

## 2017-04-05 NOTE — Evaluation (Signed)
Clinical/Bedside Swallow Evaluation Patient Details  Name: [name] [name] MRN: [id number] Date of Birth: 12-10-1928  Today's Date: 04/05/2017 Time: SLP Start Time (ACUTE ONLY): 1130 SLP Stop Time (ACUTE ONLY): 1140 SLP Time Calculation (min) (ACUTE ONLY): 10 min  Past Medical History:  Past Medical History:  Diagnosis Date  . Anxiety   . Dementia   . Diabetes mellitus   . Hyperlipidemia   . Obesity   . Sleep apnea    uses CPAP at night to sleep   Past Surgical History:  Past Surgical History:  Procedure Laterality Date  . HERNIA REPAIR Right   . REPLACEMENT TOTAL KNEE Left    Dr Bethann Goo / Dr Fannie Knee  . TRANSURETHRAL RESECTION OF PROSTATE     HPI:  [name] Meleski Websteris an 83 y.o.malewho presented from home via EMS for AMS. His wife stated that he had been fatigued and diffusely weak for about 3-4 days PTA. Also developed confusion on Friday. Symptoms inculded dizziness, near syncope and decreased appetite. No speech deficit or focal weakness endorsed by his wife. The patient has a diagnosis of mild dementia per his wife. MRI revealed a smallacute infarction affecting the left basal ganglia extending into the radiating white matter tracts. Atrophy and chronic small vessel ischemic changes were also noted. CXR showed Left base atelectasis or pneumonia.   Assessment / Plan / Recommendation Clinical Impression  Patient presents with oropharyngeal swallow which appears grossly within functional limits with adequate airway protection at bedside. No overt signs of aspiration noted despite challenging with multiple consecutive straw sips of thin liquid in excess of 3 oz. Oral preparation, control and clearance appears functional for solids; pt typically wears dentures which are not available, therefore he requires extended time for mastication of regular solid, noted with mild lingual residue which he clears independently with liquid wash. At this time recommend dys 3 diet with thin  liquids given dentition; pt may advance to regular with dentures. Pt remains at mild risk for aspiration given his mild cognitive impairment at baseline. No f/u with ST recommended for dysphagia, please see cognitive linguistic evaluation for additional recommendations. SLP Visit Diagnosis: Dysphagia, unspecified (R13.10)    Aspiration Risk  Mild aspiration risk    Diet Recommendation Dysphagia 3 (Mech soft);Thin liquid   Liquid Administration via: Straw;Cup Medication Administration: Whole meds with liquid Supervision: Patient able to self feed Compensations: Slow rate;Small sips/bites Postural Changes: Seated upright at 90 degrees    Other  Recommendations Oral Care Recommendations: Oral care BID   Follow up Recommendations Home health SLP [facility] SLP for communication/cognition only)      Frequency and Duration            Prognosis Prognosis for Safe Diet Advancement: Good Barriers to Reach Goals: Cognitive deficits      Swallow Study   General Date of Onset: 04/04/17 HPI: [name] Candelas Websteris an 83 y.o.malewho presented from home via EMS for AMS. His wife stated that he had been fatigued and diffusely weak for about 3-4 days PTA. Also developed confusion on Friday. Symptoms inculded dizziness, near syncope and decreased appetite. No speech deficit or focal weakness endorsed by his wife. The patient has a diagnosis of mild dementia per his wife. MRI revealed a smallacute infarction affecting the left basal ganglia extending into the radiating white matter tracts. Atrophy and chronic small vessel ischemic changes were also noted. CXR showed Left base atelectasis or pneumonia. Type of Study: Bedside Swallow Evaluation Previous Swallow Assessment: none in chart Diet  Prior to this Study: NPO Temperature Spikes Noted: No Respiratory Status: Nasal cannula History of Recent Intubation: No Behavior/Cognition: Alert;Cooperative Oral Cavity Assessment: Within Functional  Limits Oral Care Completed by SLP: Yes Oral Cavity - Dentition: Edentulous;Dentures, not available Vision: Functional for self-feeding Self-Feeding Abilities: Able to feed self Patient Positioning: Upright in bed Baseline Vocal Quality: Other (comment) (gravelly) Volitional Cough: Strong Volitional Swallow: Able to elicit    Oral/Motor/Sensory Function     Ice Chips Ice chips: Within functional limits Presentation: Spoon   Thin Liquid Thin Liquid: Within functional limits Presentation: Cup;Straw;Self Fed    Nectar Thick Nectar Thick Liquid: Not tested   Honey Thick Honey Thick Liquid: Not tested   Puree Puree: Within functional limits Presentation: Spoon;Self Fed   Solid   GO   Solid: Impaired Presentation: Self Fed Oral Phase Impairments: Impaired mastication Oral Phase Functional Implications: Oral residue       Rondel BatonMary Beth Karsin Pesta, MS, CCC-SLP Speech-Language Pathologist [phone]  Arlana LindauMary E Aaidyn [name] 04/05/2017,12:31 PM

## 2017-04-06 DIAGNOSIS — I6300 Cerebral infarction due to thrombosis of unspecified precerebral artery: Secondary | ICD-10-CM

## 2017-04-06 DIAGNOSIS — I639 Cerebral infarction, unspecified: Secondary | ICD-10-CM

## 2017-04-06 LAB — CBC
HEMATOCRIT: 32.1 % — AB (ref 39.0–52.0)
Hemoglobin: 10.4 g/dL — ABNORMAL LOW (ref 13.0–17.0)
MCH: 31.5 pg (ref 26.0–34.0)
MCHC: 32.4 g/dL (ref 30.0–36.0)
MCV: 97.3 fL (ref 78.0–100.0)
PLATELETS: 213 10*3/uL (ref 150–400)
RBC: 3.3 MIL/uL — ABNORMAL LOW (ref 4.22–5.81)
RDW: 14.7 % (ref 11.5–15.5)
WBC: 7.4 10*3/uL (ref 4.0–10.5)

## 2017-04-06 LAB — VAS US CAROTID
LCCAPDIAS: 22 cm/s
LEFT ECA DIAS: -18 cm/s
LEFT VERTEBRAL DIAS: 20 cm/s
LICAPDIAS: -29 cm/s
Left CCA dist dias: 22 cm/s
Left CCA dist sys: 97 cm/s
Left CCA prox sys: 104 cm/s
Left ICA dist dias: -32 cm/s
Left ICA dist sys: -95 cm/s
Left ICA prox sys: -89 cm/s
RCCAPDIAS: -15 cm/s
RIGHT ECA DIAS: 2 cm/s
RIGHT VERTEBRAL DIAS: 11 cm/s
Right CCA prox sys: -73 cm/s
Right cca dist sys: -90 cm/s

## 2017-04-06 LAB — BASIC METABOLIC PANEL
Anion gap: 6 (ref 5–15)
BUN: 15 mg/dL (ref 6–20)
CHLORIDE: 106 mmol/L (ref 101–111)
CO2: 25 mmol/L (ref 22–32)
CREATININE: 1.18 mg/dL (ref 0.61–1.24)
Calcium: 8.4 mg/dL — ABNORMAL LOW (ref 8.9–10.3)
GFR calc Af Amer: >60 mL/min (ref >60)
GFR calc non Af Amer: 54 mL/min — ABNORMAL LOW (ref >60)
GLUCOSE: 116 mg/dL — AB (ref 65–99)
POTASSIUM: 4 mmol/L (ref 3.5–5.1)
SODIUM: 137 mmol/L (ref 135–145)

## 2017-04-06 LAB — GLUCOSE, CAPILLARY
GLUCOSE-CAPILLARY: 242 mg/dL — AB (ref 65–99)
Glucose-Capillary: 127 mg/dL — ABNORMAL HIGH (ref 65–99)

## 2017-04-06 MED ORDER — AZITHROMYCIN 500 MG PO TABS: 500.0000 mg | ORAL_TABLET | Freq: Every day | ORAL | 0 refills | Status: DC

## 2017-04-06 MED ORDER — ASPIRIN 325 MG PO TBEC: 325.0000 mg | DELAYED_RELEASE_TABLET | Freq: Every day | ORAL | 4 refills | Status: DC

## 2017-04-06 MED ORDER — CEFPODOXIME PROXETIL 200 MG PO TABS
200.0000 mg | ORAL_TABLET | Freq: Two times a day (BID) | ORAL | Status: DC
  Administered 2017-04-06: 200 mg via ORAL
  Filled 2017-04-06 (×3): qty 1

## 2017-04-06 MED ORDER — CEFPODOXIME PROXETIL 200 MG PO TABS: 200.0000 mg | ORAL_TABLET | Freq: Two times a day (BID) | ORAL | 0 refills | Status: DC

## 2017-04-06 MED ORDER — GUAIFENESIN-DM 100-10 MG/5ML PO SYRP: 5.0000 mL | ORAL_SOLUTION | ORAL | 0 refills | Status: DC | PRN

## 2017-04-06 MED ORDER — GUAIFENESIN ER 600 MG PO TB12: 600.0000 mg | ORAL_TABLET | Freq: Two times a day (BID) | ORAL | 0 refills | Status: DC

## 2017-04-06 MED ORDER — AZITHROMYCIN 500 MG PO TABS
500.0000 mg | ORAL_TABLET | Freq: Every day | ORAL | Status: DC
Start: 2017-04-06 — End: 2017-04-06
  Administered 2017-04-06: 500 mg via ORAL
  Filled 2017-04-06: qty 1

## 2017-04-06 MED ORDER — BENZONATATE 100 MG PO CAPS: 100.0000 mg | ORAL_CAPSULE | Freq: Three times a day (TID) | ORAL | 0 refills | Status: DC | PRN

## 2017-04-06 NOTE — Discharge Summary (Signed)
Physician Discharge Summary   Patient ID: [name] [name] MRN: [id number] DOB/AGE: [birth year] 83 y.o.  Admit date: 04/04/2017 Discharge date: 04/06/2017  Primary Care Physician:  [name], MD  Discharge Diagnoses:   . Acute CVA (cerebrovascular accident) (HCC) . CAP (community acquired pneumonia) . Memory loss . Acute metabolic encephalopathy . Dementia . Hyperlipidemia    Consults:  Neurology  Recommendations for Outpatient Follow-up:  1. PT evaluation recommended home health PT OT 2. Please repeat CBC/BMET at next visit   DIET: Dysphagia 3 diet with thin liquids    Allergies:   Allergies  Allergen Reactions  . Donepezil Shortness Of Breath and Swelling  . Lisinopril Anaphylaxis, Shortness Of Breath and Swelling    HAD TO COME TO THE E.D. AFTER TAKING THIS!!  . [name] Hcl] Shortness Of Breath and Swelling  . Robaxin [Methocarbamol] Anaphylaxis, Shortness Of Breath and Swelling    HAD TO COME TO THE E.D. AFTER TAKING THIS!!     DISCHARGE MEDICATIONS: Current Discharge Medication List    START taking these medications   Details  aspirin EC 325 MG EC tablet Take 1 tablet (325 mg total) by mouth daily. Qty: 30 tablet, Refills: 4    azithromycin (ZITHROMAX) 500 MG tablet Take 1 tablet (500 mg total) by mouth daily. X 6 days Qty: 6 tablet, Refills: 0    benzonatate (TESSALON) 100 MG capsule Take 1 capsule (100 mg total) by mouth 3 (three) times daily as needed for cough. Qty: 45 capsule, Refills: 0    cefpodoxime (VANTIN) 200 MG tablet Take 1 tablet (200 mg total) by mouth 2 (two) times daily. X 6 days Qty: 12 tablet, Refills: 0    guaiFENesin (MUCINEX) 600 MG 12 hr tablet Take 1 tablet (600 mg total) by mouth 2 (two) times daily. For 10 days Qty: 60 tablet, Refills: 0    guaiFENesin-dextromethorphan (ROBITUSSIN DM) 100-10 MG/5ML syrup Take 5 mLs by mouth every 4 (four) hours as needed for cough. Qty: 118 mL, Refills: 0      CONTINUE  these medications which have NOT CHANGED   Details  Cholecalciferol (VITAMIN D-3 PO) Take 1,000 Units by mouth daily.     citalopram (CELEXA) 20 MG tablet TAKE 1 TABLET (20 MG TOTAL) BY MOUTH DAILY. Qty: 90 tablet, Refills: 3   Associated Diagnoses: MCI (mild cognitive impairment) with memory loss    co-enzyme Q-10 30 MG capsule Take 30 mg by mouth daily.    doxazosin (CARDURA) 4 MG tablet Take 4 mg by mouth daily.    folic acid (FOLVITE) 800 MCG tablet Take 800 mcg by mouth daily.    insulin glargine (LANTUS SOLOSTAR) 100 UNIT/ML injection Inject 28 Units into the skin daily after supper.     loratadine-pseudoephedrine (CLARITIN-D 24-HOUR) 10-240 MG per 24 hr tablet Take 1 tablet by mouth daily as needed for allergies.   Associated Diagnoses: MCI (mild cognitive impairment) with memory loss    metFORMIN (GLUCOPHAGE) 1000 MG tablet Take 1,000 mg by mouth 2 (two) times daily with a meal.    Multiple Vitamin (MULTIVITAMIN WITH MINERALS) TABS Take 1 tablet by mouth daily.    omeprazole (PRILOSEC) 40 MG capsule Take 40 mg by mouth daily. Refills: 0    simvastatin (ZOCOR) 40 MG tablet Take 40 mg by mouth daily. Refills: 0    B-D ULTRAFINE III SHORT PEN 31G X 8 MM MISC Refills: 1    methotrexate 2.5 MG tablet Take 12.5 mg by mouth every Wednesday.  Brief H and P: For complete details please refer to admission H and P, but in brief Patient is a 83 year old male with diabetes mellitus, hyperlipidemia, dementia presented from home via EMS. Patient and his wife reported that he has not been himself for last few days, feeling fatigued and weak for the last 3-4 days. He has been coughing with chills, lethargic. The day before the admission, he was having confusion, subsequently more dizzy and confused on the day of admission. The patient is a poor historian and has dementia hence difficult to obtain review of systems from the patient. Patient's wife did not notice any focal  weakness, difficulty speaking or eating. Patient also had a near syncopal episode today due to weakness. He has not been eating well for the last 2 days.   Hospital Course:   CAP (community acquired pneumonia)presenting with coughing, chills, generalized weakness - Chest x-ray showed left lung infiltrates.  - Patient was placed on IV Zithromax and Rocephin, transitioned to oral Zithromax and Vantin for 6 more days  -Blood cultures negative to date, urine strep antigen negative - Continue Tessalon Perles, Robitussin, Mucinex  Acute metabolic encephalopathy likely due to acute CVA - CT head showed vaguehypodensity in the left basal ganglia, could relate to age indeterminate lacunar infarct - MRI showed acute infarction affecting the left basal ganglia extending into the radiating white matter tracts, no hemorrhage or mass effect - Neurology was consulted, patient was placed on aspirin 325 mg daily, not on any anti-platelet agents prior to admission - LDL 57, continue statin - Hemoglobin A1c in process, please follow - PT OT recommended home health PT, was arranged by case management - TCD's suboptimal but normal   Diabetes mellitus (HCC) - Continue outpatient regimen of Lantus, metformin  - Follow hemoglobin A1c  Hyperlipidemia - LDL 57, continue Zocor    Day of Discharge BP (!) 146/85 (BP Location: Right Arm)   Pulse 64   Temp 98.5 F (36.9 C)   Resp 19   Ht 5\' 9"  (1.753 m)   Wt 97.5 kg (215 lb)   SpO2 96%   BMI 31.75 kg/m   Physical Exam: General: Alert and awake oriented x3 not in any acute distress. HEENT: anicteric sclera, pupils reactive to light and accommodation CVS: S1-S2 clear no murmur rubs or gallops Chest: clear to auscultation bilaterally, no wheezing rales or rhonchi Abdomen: soft nontender, nondistended, normal bowel sounds Extremities: no cyanosis, clubbing or edema noted bilaterally Neuro: Cranial nerves II-XII intact, no focal neurological  deficits   The results of significant diagnostics from this hospitalization (including imaging, microbiology, ancillary and laboratory) are listed below for reference.    LAB RESULTS: Basic Metabolic Panel:  Recent Labs Lab 04/05/17 0551 04/06/17 0409  NA 139 137  K 4.7 4.0  CL 107 106  CO2 23 25  GLUCOSE 125* 116*  BUN 14 15  CREATININE 1.05 1.18  CALCIUM 8.7* 8.4*   Liver Function Tests:  Recent Labs Lab 04/04/17 1446  AST 18  ALT 15*  ALKPHOS 46  BILITOT 0.8  PROT 6.2*  ALBUMIN 3.5   No results for input(s): LIPASE, AMYLASE in the last 168 hours. No results for input(s): AMMONIA in the last 168 hours. CBC:  Recent Labs Lab 04/04/17 1446  04/05/17 0551 04/06/17 0409  WBC 6.2  < > 7.2 7.4  NEUTROABS 3.7  --   --   --   HGB 10.7*  < > 10.9* 10.4*  HCT 33.1*  < >  33.6* 32.1*  MCV 98.5  < > 98.8 97.3  PLT 211  < > 206 213  < > = values in this interval not displayed. Cardiac Enzymes: No results for input(s): CKTOTAL, CKMB, CKMBINDEX, TROPONINI in the last 168 hours. BNP: Invalid input(s): POCBNP CBG:  Recent Labs Lab 04/06/17 0757 04/06/17 1214  GLUCAP 127* 242*    Significant Diagnostic Studies:  Dg Chest 2 View  Result Date: 04/04/2017 CLINICAL DATA:  Cough. EXAM: CHEST  2 VIEW COMPARISON:  No comparison studies available. FINDINGS: AP and lateral views of the chest show enlargement of the cardiopericardial silhouette. No focal airspace consolidation. Interstitial markings are diffusely coarsened with chronic features. There is some trace left basilar opacity which may be related atelectasis or pneumonia. No pleural effusion. Degenerative changes noted in both shoulders. Telemetry leads overlie the chest. IMPRESSION: Cardiomegaly with underlying chronic interstitial lung disease. Left base atelectasis or pneumonia. Electronically Signed   By: Kennith Center M.D.   On: 04/04/2017 15:10   Ct Head Wo Contrast  Result Date: 04/04/2017 CLINICAL DATA:   Altered mental status near syncopal episode EXAM: CT HEAD WITHOUT CONTRAST TECHNIQUE: Contiguous axial images were obtained from the base of the skull through the vertex without intravenous contrast. COMPARISON:  MRI 04/30/2012 FINDINGS: Brain: No large territorial infarction, hemorrhage or intracranial mass is seen. Mild hypodensity in the left basal ganglia. Mild hypodensity in the white matter consistent with small vessel ischemic changes. Moderate atrophy. Ventricles within normal limits given atrophy. Vascular: No hyperdense vessels.  Carotid artery calcifications. Skull: No fracture. Sinuses/Orbits: Mucosal thickening in the maxillary sphenoid and ethmoid sinuses. Completely opacified left frontal sinus. Bony remodeling suggests chronic sinusitis. No acute orbital abnormality. Other: None IMPRESSION: 1. Vague hypodensity within the left basal ganglia, could relate to a age indeterminate lacunar infarct. There are otherwise no acute intracranial abnormality seen. 2. Small vessel ischemic changes of the white matter.  Atrophy. 3. Pan sinusitis Electronically Signed   By: Jasmine Pang M.D.   On: 04/04/2017 16:40   Mr Brain Wo Contrast  Result Date: 04/04/2017 CLINICAL DATA:  Diabetes and dementia. Mental status changes over the last few days with weakness. Lethargy. Confusion. EXAM: MRI HEAD WITHOUT CONTRAST TECHNIQUE: Multiplanar, multiecho pulse sequences of the brain and surrounding structures were obtained without intravenous contrast. COMPARISON:  CT same day.  MRI 04/30/2012. FINDINGS: Brain: Diffusion imaging shows acute infarction in the left basal ganglia extending into the radiating white matter tracts. No other acute infarction. No mass effect or hemorrhage. Brainstem appears normal. Few old small vessel cerebellar infarctions. Atrophy of the cerebral hemispheres with mild chronic small-vessel changes of the deep white matter and subcortical white matter. No hydrocephalus or extra-axial  collection. Vascular: Major vessels at the base of the brain show flow. Skull and upper cervical spine: Negative Sinuses/Orbits: Inflammatory changes of the paranasal sinuses with opacification of the left frontal sinus. Orbits negative. Other: None significant IMPRESSION: MRI confirms the CT suggested acute infarction affecting the left basal ganglia extending into the radiating white matter tracts. No hemorrhage or mass effect. Atrophy and chronic small vessel ischemic changes elsewhere. Sinusitis. Electronically Signed   By: Paulina Fusi M.D.   On: 04/04/2017 19:02    2D ECHO: ------------------------------------------------------------------- Study Conclusions  - Left ventricle: The cavity size was normal. Systolic function was   normal. The estimated ejection fraction was in the range of 60%   to 65%. Wall motion was normal; there were no regional wall   motion abnormalities. -  Aortic valve: Moderately calcified annulus. Trileaflet; normal   thickness, mildly calcified leaflets. - Atrial septum: There was increased thickness of the septum,   consistent with lipomatous hypertrophy. - Pulmonary arteries: PA peak pressure: 38 mm Hg (S).  Impressions:  - The right ventricular systolic pressure was increased consistent   with mild pulmonary hypertension.   Disposition and Follow-up: Discharge Instructions    Ambulatory referral to Neurology    Complete by:  As directed    An appointment is requested in approximately: 6 weeks for stroke       DISPOSITION:    DISCHARGE FOLLOW-UP Follow-up Information    [name], MD. Schedule an appointment as soon as possible for a visit in 2 week(s).   Specialty:  Family Medicine Contact information: [phone] [place] [address] [phone]        Micki Riley, MD. Schedule an appointment as soon as possible for a visit in 6 week(s).   Specialties:  Neurology, Radiology Contact information: [address] [place] [address] [phone]            Time spent on Discharge: 35 mins   Signed:   Thad Ranger M.D. [facility] 04/06/2017, 2:24 PM Pager: [phone]

## 2017-04-06 NOTE — Care Management Note (Signed)
Case Management Note  Patient Details  Name: [name] [name] MRN: [id number] Date of Birth: 04/15/1929  Subjective/Objective:                    Action/Plan: Pt discharging home with orders for [facility] services. CM met with the patient and his family and provided a list of [place] agencies. They selected [facility]. Mary with [facility] notified and accepted the referral.  Pt with orders for walker and shower seat. Family going to obtain shower seat outside of the hospital. Santiago Glad with [facility] notified of rolling walker and will deliver the equipment to the room.  Family to provide transportation home.  Bedside RN updated.  Expected Discharge Date:  04/06/17               Expected Discharge Plan:  [place]  In-House Referral:     Discharge planning Services  CM Consult  Post Acute Care Choice:  Durable Medical Equipment, Home Health Choice offered to:  Patient, Adult Children, Spouse  DME Arranged:  Walker rolling DME Agency:  [place] Arranged:  RN, OT, PT, Nurse's Aide [place] Agency:  [facility] (now [facility])  Status of Service:  Completed, signed off  If discussed at [employer] of Stay Meetings, dates discussed:    Additional Comments:  [name] Friar, RN 04/06/2017, 3:14 PM

## 2017-04-06 NOTE — Progress Notes (Signed)
STROKE TEAM PROGRESS NOTE   HISTORY OF PRESENT ILLNESS (per record) [name] [name] is an 83 y.o. male who presented from home via EMS for AMS. His wife stated that he had been fatigued and diffusely weak for about 3-4 days PTA. Also developed confusion on Friday. Symptoms inculded dizziness, near syncope and decreased appetite. No speech deficit or focal weakness endorsed by his wife. The patient has a diagnosis of mild dementia per his wife.   MRI revealed a small acute infarction affecting the left basal ganglia extending into the radiating white matter tracts. Atrophy and chronic small vessel ischemic changes were also noted.     SUBJECTIVE (INTERVAL HISTORY) The patient's wife and  daughter were at the bedside. They report that the patient is much improved.No new complaints. He wants to go home   OBJECTIVE Temp:  [98.5 F (36.9 C)-99.5 F (37.5 C)] 98.5 F (36.9 C) (07/23 1217) Pulse Rate:  [44-68] 64 (07/23 1217) Cardiac Rhythm: Normal sinus rhythm (07/23 0700) Resp:  [18-20] 19 (07/23 1217) BP: (122-149)/(65-85) 146/85 (07/23 1217) SpO2:  [96 %-100 %] 96 % (07/23 1217)  CBC:   Recent Labs Lab 04/04/17 1446  04/05/17 0551 04/06/17 0409  WBC 6.2  < > 7.2 7.4  NEUTROABS 3.7  --   --   --   HGB 10.7*  < > 10.9* 10.4*  HCT 33.1*  < > 33.6* 32.1*  MCV 98.5  < > 98.8 97.3  PLT 211  < > 206 213  < > = values in this interval not displayed.  Basic Metabolic Panel:   Recent Labs Lab 04/05/17 0551 04/06/17 0409  NA 139 137  K 4.7 4.0  CL 107 106  CO2 23 25  GLUCOSE 125* 116*  BUN 14 15  CREATININE 1.05 1.18  CALCIUM 8.7* 8.4*    Lipid Panel:     Component Value Date/Time   CHOL 128 04/05/2017 0551   TRIG 99 04/05/2017 0551   HDL 51 04/05/2017 0551   CHOLHDL 2.5 04/05/2017 0551   VLDL 20 04/05/2017 0551   LDLCALC 57 04/05/2017 0551   HgbA1c: No results found for: HGBA1C Urine Drug Screen: No results found for: LABOPIA, COCAINSCRNUR, LABBENZ,  AMPHETMU, THCU, LABBARB  Alcohol Level No results found for: [facility]  [facility]AGING   Dg Chest 2 View 04/04/2017 Cardiomegaly with underlying chronic interstitial lung disease. Left base atelectasis or pneumonia.    Ct Head Wo Contrast 04/04/2017 1. Vague hypodensity within the left basal ganglia, could relate to a age indeterminate lacunar infarct. There are otherwise no acute intracranial abnormality seen.  2. Small vessel ischemic changes of the white matter.  Atrophy.  3. Pan sinusitis    Mr Brain Wo Contrast 04/04/2017 MRI confirms the CT suggested acute infarction affecting the left basal ganglia extending into the radiating white matter tracts. No hemorrhage or mass effect. Atrophy and chronic small vessel ischemic changes elsewhere. Sinusitis.     PHYSICAL EXAM Pleasant elderly Caucasian male currently not in distress.  . Afebrile. Head is nontraumatic. Neck is supple without bruit.    Cardiac exam no murmur or gallop. Lungs are clear to auscultation. Distal pulses are well felt.  Neurological Exam ;  Awake  Alert oriented x 3. No aphasia or dysarthria.eye movements full without nystagmus.fundi were not visualized. Vision acuity and fields appear normal. Hearing is normal. Palatal movements are normal. Face symmetric. Tongue midline. Normal strength, tone, reflexes and coordination. Normal sensation. Gait deferred.      ASSESSMENT/PLAN  Mr. [name] [name] is a 83 y.o. male with history of  obstructive sleep apnea, obesity, hyperlipidemia, diabetes mellitus, and dementia presenting with weakness, presyncope, altered mental status, and dizziness. He did not receive IV t-PA due to unknown time of onset.  Stroke:  left basal ganglia infarct - secondary to small vessel disease  Resultant  Confusion and speech difficulty  CT head - vague hypodensity within the left basal ganglia,  MRI head - acute infarction affecting the left basal ganglia extending into the radiating white  matter tracts.  MRA head - not performed  Carotid Doppler - Bilateral: No significant (1-39%) ICA stenosis. Antegrade vertebral flow.  2D Echo - Left ventricle: The cavity size was normal. Systolic function was   normal. The estimated ejection fraction was in the range of 60%   to 65%. Wall motion was normal; there were no regional wall    motion abnormalities. Transcranial Dopplers - low mean flow velocities in the right MCA, bilateral vertebral and basilar arteries secondary to poor waveforms from suboptimal windows.Normal mean flow velocities in majority of the remaining  identified vessels of anterior and posterior cerebral circulation.  BLE Dopplers - negative for DVT  LDL - 57  HgbA1c pending  VTE prophylaxis - Lovenox DIET DYS 3 Room service appropriate? Yes; Fluid consistency: Thin  No antithrombotic prior to admission, now on aspirin 325 mg daily  Patient counseled to be compliant with his antithrombotic medications  Ongoing aggressive stroke risk factor management  Therapy recommendations: Home health PT recommended -   Disposition: home Hypertension  Stable  Permissive hypertension (OK if < 220/120) but gradually normalize in 5-7 days  Long-term BP goal normotensive  Hyperlipidemia  Home meds:  Zocor 40 mg daily resumed in hospital  LDL 57, goal < 70  Continue statin at discharge  Diabetes  HgbA1c pending, goal < 7.0  Controlled  Other Stroke Risk Factors  Advanced age  Obesity, Body mass index is 31.75 kg/m., recommend weight loss, diet and exercise as appropriate   Obstructive sleep apnea, on CPAP at home  Other Active Problems  Dementia  Anemia  Pneumonia - Zithromax and Rocephin started 04/04/2017  Pan sinusitis on head CT  Hospital day # 2    I have personally examined this patient, reviewed notes, independently viewed imaging studies, participated in medical decision making and plan of care.ROS completed by me personally and  pertinent positives fully documented  I have made any additions or clarifications directly to the above note. Agree with note above.  He presented with transient confusion which appears to be improving from small left basal ganglia infarct from small vessel disease. Continue aspirin and ongoing stroke workup.long discussion at the bedside with the patient and wife and  answered questions.greater than 50% time during this 25 minute visit was spent on counseling and coordination of care about his lacunar infarct, stroke prevention and treatment discussion. Discussed with Dr.Rai Stroke team will sign off. Follow-up as an outpatient in stroke clinic in 6 weeks. Kindly call for questions if any.  Delia Heady, MD Medical Director [facility] Pager: [phone] 04/06/2017 3:34 PM   To contact Stroke Continuity provider, please refer to [email or web address]. After hours, contact General Neurology

## 2017-04-06 NOTE — Plan of Care (Signed)
Problem: Safety: Goal: Ability to remain free from injury will improve Outcome: Progressing Pt will be free from falls and injuries during this hospitalization.  Problem: Education: Goal: Knowledge of secondary prevention will improve Outcome: Progressing Pt's knowledge on his condition will improve prior to discharge.

## 2017-04-06 NOTE — Progress Notes (Signed)
Occupational Therapy Treatment Patient Details Name: [name] [name] MRN: [id number] DOB: 1928/10/30 Today's Date: 04/06/2017    History of present illness [name] [name] is an 83 y.o. male who presented from home via EMS for AMS. His wife stated that he had been fatigued and diffusely weak for about 3-4 days PTA. Also developed confusion on Friday 7/20. Symptoms inculded dizziness, near syncope and decreased appetite. No speech deficit or focal weakness endorsed by his wife. The patient has a diagnosis of mild dementia per his wife. MRI revealed a small acute infarction affecting the left basal ganglia extending into the radiating white matter tracts. Atrophy and chronic small vessel ischemic changes were also noted. CXR showed Left base atelectasis or pneumonia.   OT comments  This 83 yo male admitted with above presents to acute OT today making progress with transfers and toileting. He will continue to benefit from [facility]. Pt is to D/C from hospital today, so we will D/C him from acute OT.   Follow Up Recommendations  Home health OT;Supervision/Assistance - 24 hour    Equipment Recommendations  None recommended by OT       Precautions / Restrictions Precautions Precautions: Fall Restrictions Weight Bearing Restrictions: No       Mobility Bed Mobility Overal bed mobility: Modified Independent Bed Mobility: Supine to Sit;Sit to Supine     Supine to sit: Modified independent (Device/Increase time) Sit to supine: Modified independent (Device/Increase time)   General bed mobility comments: increased time  Transfers Overall transfer level: Needs assistance Equipment used: Rolling walker (2 wheeled) Transfers: Sit to/from Stand Sit to Stand: Supervision         General transfer comment: min guard A to ambulate around "circle" part of unit    Balance Overall balance assessment: Needs assistance Sitting-balance support: Feet supported;No upper extremity  supported Sitting balance-Leahy Scale: Good     Standing balance support: During functional activity;No upper extremity supported Standing balance-Leahy Scale: Fair Standing balance comment: Able to statically stand without UE support.                            ADL either performed or assessed with clinical judgement   ADL Overall ADL's : Needs assistance/impaired                         Toilet Transfer: Supervision/safety;Ambulation;RW;Comfort height toilet;Grab bars   Toileting- Clothing Manipulation and Hygiene: Supervision/safety;Sit to/from stand               Vision Baseline Vision/History: Retinopathy Patient Visual Report: No change from baseline            Cognition Arousal/Alertness: Awake/alert Behavior During Therapy: WFL for tasks assessed/performed Overall Cognitive Status: History of cognitive impairments - at baseline                                                     Pertinent Vitals/ Pain       Pain Assessment: No/denies pain         Frequency  Min 2X/week        Progress Toward Goals  OT Goals(current goals can now be found in the care plan section)  Progress towards OT goals: Progressing toward goals     Plan Equipment recommendations  need to be updated;Discharge plan remains appropriate       AM-PAC PT "6 Clicks" Daily Activity     Outcome Measure   Help from another person eating meals?: None Help from another person taking care of personal grooming?: A Little Help from another person toileting, which includes using toliet, bedpan, or urinal?: A Little Help from another person bathing (including washing, rinsing, drying)?: A Little Help from another person to put on and taking off regular upper body clothing?: A Little Help from another person to put on and taking off regular lower body clothing?: A Little 6 Click Score: 19    End of Session Equipment Utilized During Treatment:  Gait belt;Rolling walker  OT Visit Diagnosis: Unsteadiness on feet (R26.81);Muscle weakness (generalized) (M62.81)   Activity Tolerance Patient tolerated treatment well   Patient Left in bed;with call bell/phone within reach;with bed alarm set;with family/visitor present           Time: 4010-27251418-1455 OT Time Calculation (min): 37 min  Charges: OT General Charges $OT Visit: 1 Procedure OT Treatments $Self Care/Home Management : 23-37 mins  Ignacia PalmaCathy Naamah Boggess, OTR/L [phone] 04/06/2017

## 2017-04-08 LAB — HEMOGLOBIN A1C
Hgb A1c MFr Bld: 7.2 % — ABNORMAL HIGH (ref 4.8–5.6)
MEAN PLASMA GLUCOSE: 160 mg/dL

## 2017-04-08 LAB — LEGIONELLA PNEUMOPHILA SEROGP 1 UR AG: L. pneumophila Serogp 1 Ur Ag: NEGATIVE

## 2017-04-09 LAB — CULTURE, BLOOD (ROUTINE X 2)
CULTURE: NO GROWTH
CULTURE: NO GROWTH
Culture: NO GROWTH
Culture: NO GROWTH
SPECIAL REQUESTS: ADEQUATE
Special Requests: ADEQUATE
Special Requests: ADEQUATE
Special Requests: ADEQUATE

## 2017-04-17 ENCOUNTER — Other Ambulatory Visit: Payer: Self-pay | Admitting: *Deleted

## 2017-04-20 NOTE — Patient Outreach (Signed)
[facility] Care Management  04/17/17  [name] [name] 05/14/1929 [id number]  EMMI- Stroke RED ON EMMI ALERT DAY#: 9 DATE: 04/16/17 RED ALERT: Lost interest in things they used to enjoy? Yes Sad, hopeless, anxious, or empty? Yes    Outreach attempt # 1, spoke with spouse [name] Rhodes(Betty). Reviewed and addressed red alert. [name] RhodesBetty confirmed, patient is not capable of performing activities that he used to enjoy. Patient was participating in activities with the [employer]. [name] RhodesBetty reported, a person from the [employer] brought a retirement certificate to the patient. The patient for thanked and honored for his services. The patient was actively participating in club meetings. He is not able to participate related to his weakness and limited mobility.  [name] RhodesBetty stated, patient is beginning to feel better and his health is improving. Patient is active with [facility] (RN/PT/OT/HHA). Patient continues to require assistance with dressing, per wife. He enjoys reading the newspaper and watching game shows. His follow-up hospital discharge appointment was on 04/13/17 with PCP. [name] RhodesBetty reported, patient had a good evaluation. [name] RhodesBetty stated, patient doesn't have any feelings of sad, hopeless, anxious, or empty. Advised spouse that they would continue to get automated EMMI-Stroke post discharge calls. The purpose of the phone calls is to assess how the patient is doing following recent hospitalization. He will receive a call from a nurse, if any of their responses were abnormal. [name] RhodesBetty voiced understanding and was appreciative of f/u call.  Plan: RN CM will notify [facility] CM administrative assistant regarding case closure.   Wynelle ClevelandJuanita Kaj Vasil, RN, BSN, MHA/MSL, [facility] Telephonic Care Manager Coordinator [facility] Direct Phone: [phone] Toll Free: [phone] Fax: [phone]

## 2017-05-26 ENCOUNTER — Encounter: Payer: Self-pay | Admitting: Neurology

## 2017-05-26 ENCOUNTER — Ambulatory Visit (INDEPENDENT_AMBULATORY_CARE_PROVIDER_SITE_OTHER): Payer: Medicare Other | Admitting: Neurology

## 2017-05-26 VITALS — BP 112/71 | HR 72 | Wt 212.4 lb

## 2017-05-26 DIAGNOSIS — I6381 Other cerebral infarction due to occlusion or stenosis of small artery: Secondary | ICD-10-CM

## 2017-05-26 DIAGNOSIS — I639 Cerebral infarction, unspecified: Secondary | ICD-10-CM

## 2017-05-26 NOTE — Progress Notes (Signed)
[facility] [address] [place]. [place] [id number] [phone]       OFFICE FOLLOW-UP NOTE  [name]. [name] [name] Date of Birth:  [birth year] Medical Record Number:  [id number]   HPI: [name] [name] is a [age] year Caucasian male seen today for the first office follow-up visit after hospital admission for stroke in July 2018. He is accompanied by his wife. History is obtained  from them as well as review of electronic medical records and have personally reviewed imaging films.[name] ShengCharles [name] Websteris an 83 y.o.malewho presented from home via EMS for AMS. His wife stated that he had been fatigued and diffusely weak for about 3-4 days PTA. Also developed confusion on Friday. Symptoms inculded dizziness, near syncope and decreased appetite. No speech deficit or focal weakness endorsed by his wife. The patient has a diagnosis of mild dementia per his wife. MRI revealed a smallacute infarction affecting the left basal ganglia extending into the radiating white matter tracts. Atrophy and chronic small vessel ischemic changes were also noted. Carotid Doppler showed no significant expectoration stenosis. Transthoracic echo showed normal ejection fraction. Transcranial Doppler showed low mean flow velocities in the right MCA and bilateral vertebral and basilar arteries due to poor waveforms from suboptimal windows. Lower extremity venous Dopplers are negative for DVT. LDL cholesterol was 57 mg percent. Hemoglobin A1c was elevated at 7.2. Patient states segment good recovery from physical standpoint and is able to walk at his baseline with a cane. However his memory seems to have gotten worse. He has no short-term memory difficulties now than before the stroke. Patient actually has a history of mild cognitive impairment and has been seen in our office previously by Dr. Kandyce Rudomeier. He also has some tremors. He is currently on aspirin which is tolerating well without bleeding but does get a few  bruising easily. His blood pressure well controlled today it is 112/71. He states his sugars have well controlled though his hemoglobin A1c was elevated in the hospital. He is tolerating Zocor well without muscle aches and pains.  ROS:   14 system review of systems is positive for  joint pain, tremor, anxiety, slurred speech, difficulty swallowing, memory loss, confusion, snoring, restless leg and all other systems negative  PMH:  Past Medical History:  Diagnosis Date  . Anxiety   . Dementia   . Diabetes mellitus   . Hyperlipidemia   . Obesity   . Sleep apnea    uses CPAP at night to sleep  . Stroke [facility](HCC)     Social History:  Social History   Social History  . Marital status: Married    Spouse name: [name] [name]  . Number of children: 2  . Years of education: 12th   Occupational History  . retired    Social History Main Topics  . Smoking status: Never Smoker  . Smokeless tobacco: Never Used  . Alcohol use No  . Drug use: No  . Sexual activity: Not on file   Other Topics Concern  . Not on file   Social History Narrative   Patient lives at home and drinks caffinated  Drinks daily. Married, 2 kids.  Caffeine 2-3 cups daily.    Hs Graduate.  Retired.      Medications:   Current Outpatient Prescriptions on File Prior to Visit  Medication Sig Dispense Refill  . aspirin EC 325 MG EC tablet Take 1 tablet (325 mg total) by mouth daily. 30 tablet 4  . B-D ULTRAFINE III SHORT PEN 31G  X 8 MM MISC   1  . Cholecalciferol (VITAMIN D-3 PO) Take 1,000 Units by mouth daily.     . citalopram (CELEXA) 20 MG tablet TAKE 1 TABLET (20 MG TOTAL) BY MOUTH DAILY. 90 tablet 3  . co-enzyme Q-10 30 MG capsule Take 30 mg by mouth daily.    Marland Kitchen doxazosin (CARDURA) 4 MG tablet Take 4 mg by mouth daily.    . folic acid (FOLVITE) 800 MCG tablet Take 800 mcg by mouth daily.    Marland Kitchen guaiFENesin (MUCINEX) 600 MG 12 hr tablet Take 1 tablet (600 mg total) by mouth 2 (two) times daily. For 10 days 60 tablet 0  .  guaiFENesin-dextromethorphan (ROBITUSSIN DM) 100-10 MG/5ML syrup Take 5 mLs by mouth every 4 (four) hours as needed for cough. 118 mL 0  . insulin glargine (LANTUS SOLOSTAR) 100 UNIT/ML injection Inject 24 Units into the skin daily after supper.     . loratadine-pseudoephedrine (CLARITIN-D 24-HOUR) 10-240 MG per 24 hr tablet Take 1 tablet by mouth daily as needed for allergies.    . metFORMIN (GLUCOPHAGE) 1000 MG tablet Take 1,000 mg by mouth 2 (two) times daily with a meal.    . methotrexate 2.5 MG tablet Take 12.5 mg by mouth every Wednesday.     . Multiple Vitamin (MULTIVITAMIN WITH MINERALS) TABS Take 1 tablet by mouth daily.    Marland Kitchen omeprazole (PRILOSEC) 40 MG capsule Take 40 mg by mouth daily.  0  . simvastatin (ZOCOR) 40 MG tablet Take 40 mg by mouth daily.  0   No current facility-administered medications on file prior to visit.     Allergies:   Allergies  Allergen Reactions  . Donepezil Shortness Of Breath and Swelling  . Lisinopril Anaphylaxis, Shortness Of Breath and Swelling    HAD TO COME TO THE E.D. AFTER TAKING THIS!!  . [name] Hcl] Shortness Of Breath and Swelling  . Robaxin [Methocarbamol] Anaphylaxis, Shortness Of Breath and Swelling    HAD TO COME TO THE E.D. AFTER TAKING THIS!!    Physical Exam General: Obese elderly Caucasian male seated, in no evident distress Head: head normocephalic and atraumatic.  Neck: supple with no carotid or supraclavicular bruits Cardiovascular: regular rate and rhythm, no murmurs Musculoskeletal: no deformity Skin:  no rash/petichiae Vascular:  Normal pulses all extremities Vitals:   05/26/17 1549  BP: 112/71  Pulse: 72   Neurologic Exam Mental Status: Awake and fully alert. Oriented to place and time. Recent and remote memory intact. Attention span, concentration and fund of knowledge appropriate. Mood and affect appropriate.  Cranial Nerves: Fundoscopic exam reveals sharp disc margins. Pupils equal, briskly reactive to  light. Extraocular movements full without nystagmus. Visual fields full to confrontation. Hearing intact. Facial sensation intact. Face, tongue, palate moves normally and symmetrically.  Motor: Normal bulk and tone. Normal strength in all tested extremity muscles.Diminished fine finger movements on the right. Orbits left over right upper extremity. Minimum right grip weakness. Sensory.: intact to touch ,pinprick .position and vibratory sensation.  Coordination: Rapid alternating movements normal in all extremities. Finger-to-nose and heel-to-shin performed accurately bilaterally. Gait and Station: Arises from chair without difficulty. Uses a cane. Gait demonstrates normal stride length and balance . Not able to heel, toe and tandem walk   Reflexes: 1+ and symmetric. Toes downgoing.       ASSESSMENT: 83 year old Caucasian male with a left basal ganglia lacunar infarct in July 2018 with vascular risk factors of diabetes, hypertension, hyperlipidemia, obesity and sleep apnea. He  also has mild cognitive impairment and tremors for which she sees Dr. Vickey Huger    PLAN: I had a long d/w patient about his recent lacunar stroke, risk for recurrent stroke/TIAs, personally independently reviewed imaging studies and stroke evaluation results and answered questions.Continue aspirin 325 mg daily  for secondary stroke prevention and maintain strict control of hypertension with blood pressure goal below 130/90, diabetes with hemoglobin A1c goal below 6.5% and lipids with LDL cholesterol goal below 70 mg/dL. I also advised the patient to eat a healthy diet with plenty of whole grains, cereals, fruits and vegetables, exercise regularly and maintain ideal body weight I advised him to do mentally challenging activities like solving crossword puzzles, bridge and sudoku to help with his mild cognitive impairment. He also discussed memory compensation strategies. He was advised to follow-up with Tawana Scale, nurse  practitioner as previously scheduled by Dr. Vickey Huger. No scheduled appointment with me is necessary. Greater than 50% of time during this 25 minute visit was spent on counseling,explanation of diagnosis of lacunar infarct and mild cognitive impairment, planning of further management, discussion with patient and family and coordination of care Delia Heady, MD  [facility] [address] [place], [place] [address]  Phone [phone] Fax [phone] Note: This document was prepared with digital dictation and possible smart phrase technology. Any transcriptional errors that result from this process are unintentional

## 2017-05-26 NOTE — Patient Instructions (Signed)
I had a long d/w patient about his recent lacunar stroke, risk for recurrent stroke/TIAs, personally independently reviewed imaging studies and stroke evaluation results and answered questions.Continue aspirin 325 mg daily  for secondary stroke prevention and maintain strict control of hypertension with blood pressure goal below 130/90, diabetes with hemoglobin A1c goal below 6.5% and lipids with LDL cholesterol goal below 70 mg/dL. I also advised the patient to eat a healthy diet with plenty of whole grains, cereals, fruits and vegetables, exercise regularly and maintain ideal body weight I advised him to do mentally challenging activities like solving crossword puzzles, bridge and sudoku to help with his mild cognitive impairment. He also discussed memory compensation strategies. He was advised to follow-up with Tawana Scale, nurse practitioner as previously scheduled by Dr. Vickey Huger. No scheduled appointment with me is necessary. Stroke Prevention Some medical conditions and behaviors are associated with an increased chance of having a stroke. You may prevent a stroke by making healthy choices and managing medical conditions. How can I reduce my risk of having a stroke?  Stay physically active. Get at least 30 minutes of activity on most or all days.  Do not smoke. It may also be helpful to avoid exposure to secondhand smoke.  Limit alcohol use. Moderate alcohol use is considered to be: ? No more than 2 drinks per day for men. ? No more than 1 drink per day for nonpregnant women.  Eat healthy foods. This involves: ? Eating 5 or more servings of fruits and vegetables a day. ? Making dietary changes that address high blood pressure (hypertension), high cholesterol, diabetes, or obesity.  Manage your cholesterol levels. ? Making food choices that are high in fiber and low in saturated fat, trans fat, and cholesterol may control cholesterol levels. ? Take any prescribed medicines to control  cholesterol as directed by your health care provider.  Manage your diabetes. ? Controlling your carbohydrate and sugar intake is recommended to manage diabetes. ? Take any prescribed medicines to control diabetes as directed by your health care provider.  Control your hypertension. ? Making food choices that are low in salt (sodium), saturated fat, trans fat, and cholesterol is recommended to manage hypertension. ? Ask your health care provider if you need treatment to lower your blood pressure. Take any prescribed medicines to control hypertension as directed by your health care provider. ? If you are 63-85 years of age, have your blood pressure checked every 3-5 years. If you are 46 years of age or older, have your blood pressure checked every year.  Maintain a healthy weight. ? Reducing calorie intake and making food choices that are low in sodium, saturated fat, trans fat, and cholesterol are recommended to manage weight.  Stop drug abuse.  Avoid taking birth control pills. ? Talk to your health care provider about the risks of taking birth control pills if you are over 46 years old, smoke, get migraines, or have ever had a blood clot.  Get evaluated for sleep disorders (sleep apnea). ? Talk to your health care provider about getting a sleep evaluation if you snore a lot or have excessive sleepiness.  Take medicines only as directed by your health care provider. ? For some people, aspirin or blood thinners (anticoagulants) are helpful in reducing the risk of forming abnormal blood clots that can lead to stroke. If you have the irregular heart rhythm of atrial fibrillation, you should be on a blood thinner unless there is a good reason you cannot take them. ?  Understand all your medicine instructions.  Make sure that other conditions (such as anemia or atherosclerosis) are addressed. Get help right away if:  You have sudden weakness or numbness of the face, arm, or leg, especially on  one side of the body.  Your face or eyelid droops to one side.  You have sudden confusion.  You have trouble speaking (aphasia) or understanding.  You have sudden trouble seeing in one or both eyes.  You have sudden trouble walking.  You have dizziness.  You have a loss of balance or coordination.  You have a sudden, severe headache with no known cause.  You have new chest pain or an irregular heartbeat. Any of these symptoms may represent a serious problem that is an emergency. Do not wait to see if the symptoms will go away. Get medical help at once. Call your local emergency services (911 in [place] Do not drive yourself to the hospital. This information is not intended to replace advice given to you by your health care provider. Make sure you discuss any questions you have with your health care provider. Document Released: 10/09/2004 Document Revised: 02/07/2016 Document Reviewed: 03/04/2013 Elsevier Interactive Patient Education  2017 Elsevier Inc. Memory Compensation Strategies  1. Use "WARM" strategy.  W= write it down  A= associate it  R= repeat it  M= make a mental note  2.   You can keep a Glass blower/designerMemory Notebook.  Use a 3-ring notebook with sections for the following: calendar, important names and phone numbers,  medications, doctors' names/phone numbers, lists/reminders, and a section to journal what you did  each day.   3.    Use a calendar to write appointments down.  4.    Write yourself a schedule for the day.  This can be placed on the calendar or in a separate section of the Memory Notebook.  Keeping a  regular schedule can help memory.  5.    Use medication organizer with sections for each day or morning/evening pills.  You may need help loading it  6.    Keep a basket, or pegboard by the door.  Place items that you need to take out with you in the basket or on the pegboard.  You may also want to  include a message board for reminders.  7.    Use sticky  notes.  Place sticky notes with reminders in a place where the task is performed.  For example: " turn off the  stove" placed by the stove, "lock the door" placed on the door at eye level, " take your medications" on  the bathroom mirror or by the place where you normally take your medications.  8.    Use alarms/timers.  Use while cooking to remind yourself to check on food or as a reminder to take your medicine, or as a  reminder to make a call, or as a reminder to perform another task, etc.

## 2017-07-07 ENCOUNTER — Ambulatory Visit (INDEPENDENT_AMBULATORY_CARE_PROVIDER_SITE_OTHER): Payer: Medicare Other | Admitting: Podiatry

## 2017-07-07 ENCOUNTER — Encounter: Payer: Self-pay | Admitting: Podiatry

## 2017-07-07 DIAGNOSIS — M79675 Pain in left toe(s): Secondary | ICD-10-CM

## 2017-07-07 DIAGNOSIS — B351 Tinea unguium: Secondary | ICD-10-CM

## 2017-07-07 DIAGNOSIS — E119 Type 2 diabetes mellitus without complications: Secondary | ICD-10-CM

## 2017-07-07 DIAGNOSIS — M79674 Pain in right toe(s): Secondary | ICD-10-CM | POA: Diagnosis not present

## 2017-07-07 NOTE — Progress Notes (Signed)
Patient ID: [name] [name], male   DOB: [birth year], 83 y.o.   MRN: [id number]    Subjective: This patient presents for scheduled visit complaining of painful toenails and walking wearing shoes and requests nail debridement, with wife present treatment room  Objective: Orientated 3 DP and PT pulses 2/4 bilaterally Capillary reflex delay bilaterally Sensation to 10 g monofilament wire intact 4/5 right 5/left Vibratory sensation reactive bilaterally Ankle reflex equal and reactive bilaterally HAV bilaterally No open skin lesions bilaterally Dry scaling skin bilaterally The toenails are brittle, elongated, incurvated, hypertrophic and tender to direct palpation 6-10  Assessment: Symptomatic onychomycoses 6-10 Diabeticwith satisfactory neurovascular status  Plan: Debrided toenails 10 and can mechanically and electrically without any bleeding  Reappoint 3 months

## 2017-07-07 NOTE — Patient Instructions (Signed)

## 2017-08-21 ENCOUNTER — Other Ambulatory Visit: Payer: Self-pay | Admitting: Family Medicine

## 2017-08-21 DIAGNOSIS — R609 Edema, unspecified: Secondary | ICD-10-CM

## 2017-08-21 DIAGNOSIS — R7989 Other specified abnormal findings of blood chemistry: Secondary | ICD-10-CM

## 2017-08-25 ENCOUNTER — Other Ambulatory Visit: Payer: Medicare Other

## 2017-08-27 ENCOUNTER — Ambulatory Visit
Admission: RE | Admit: 2017-08-27 | Discharge: 2017-08-27 | Disposition: A | Discharge status: Home / Self Care | Payer: Medicare Other | Source: Ambulatory Visit | Attending: Family Medicine | Admitting: Family Medicine

## 2017-08-27 DIAGNOSIS — R7989 Other specified abnormal findings of blood chemistry: Secondary | ICD-10-CM

## 2017-08-27 DIAGNOSIS — R609 Edema, unspecified: Secondary | ICD-10-CM

## 2017-09-01 ENCOUNTER — Ambulatory Visit: Payer: Medicare Other | Admitting: Adult Health

## 2017-10-01 DIAGNOSIS — M179 Osteoarthritis of knee, unspecified: Secondary | ICD-10-CM

## 2017-10-01 DIAGNOSIS — M1710 Unilateral primary osteoarthritis, unspecified knee: Secondary | ICD-10-CM

## 2017-10-01 HISTORY — DX: Unilateral primary osteoarthritis, unspecified knee: M17.10

## 2017-10-01 HISTORY — DX: Osteoarthritis of knee, unspecified: M17.9

## 2017-10-05 ENCOUNTER — Ambulatory Visit: Payer: Medicare Other | Admitting: Podiatry

## 2017-10-05 ENCOUNTER — Encounter: Payer: Self-pay | Admitting: Podiatry

## 2017-10-05 DIAGNOSIS — M79674 Pain in right toe(s): Secondary | ICD-10-CM

## 2017-10-05 DIAGNOSIS — M79675 Pain in left toe(s): Secondary | ICD-10-CM | POA: Diagnosis not present

## 2017-10-05 DIAGNOSIS — B351 Tinea unguium: Secondary | ICD-10-CM

## 2017-10-05 NOTE — Progress Notes (Signed)
Complaint:  Visit Type: Patient returns to my office for continued preventative foot care services. Complaint: Patient states" my nails have grown long and thick and become painful to walk and wear shoes"  The patient presents for preventative foot care services. No changes to ROS  Podiatric Exam: Vascular: dorsalis pedis and posterior tibial pulses are palpable bilateral. Capillary return is immediate. Temperature gradient is WNL. Skin turgor WNL  Sensorium: Normal Semmes Weinstein monofilament test. Normal tactile sensation bilaterally. Nail Exam: Pt has thick disfigured discolored nails with subungual debris noted bilateral entire nail hallux through fifth toenails Ulcer Exam: There is no evidence of ulcer or pre-ulcerative changes or infection. Orthopedic Exam: Muscle tone and strength are WNL. No limitations in general ROM. No crepitus or effusions noted. HAV  B/L. Skin: No Porokeratosis. No infection or ulcers  Diagnosis:  Onychomycosis, , Pain in right toe, pain in left toes  Treatment & Plan Procedures and Treatment: Consent by patient was obtained for treatment procedures. The patient understood the discussion of treatment and procedures well. All questions were answered thoroughly reviewed. Debridement of mycotic and hypertrophic toenails, 1 through 5 bilateral and clearing of subungual debris. No ulceration, no infection noted.  Return Visit-Office Procedure: Patient instructed to return to the office for a follow up visit 3 months for continued evaluation and treatment.    Karissa Meenan DPM 

## 2017-12-22 ENCOUNTER — Encounter: Payer: Self-pay | Admitting: Adult Health

## 2017-12-22 ENCOUNTER — Ambulatory Visit: Payer: Medicare Other | Admitting: Adult Health

## 2017-12-22 VITALS — BP 129/78 | HR 77 | Ht 69.0 in | Wt 212.0 lb

## 2017-12-22 DIAGNOSIS — I6381 Other cerebral infarction due to occlusion or stenosis of small artery: Secondary | ICD-10-CM | POA: Diagnosis not present

## 2017-12-22 DIAGNOSIS — R413 Other amnesia: Secondary | ICD-10-CM | POA: Diagnosis not present

## 2017-12-22 NOTE — Progress Notes (Signed)
PATIENT: [name] [name] DOB: [birth year]  REASON FOR VISIT: follow up HISTORY FROM: patient  HISTORY OF PRESENT ILLNESS: Today 12/22/17 [name] [name] is an 84 year old male with a history of progressive memory disturbance.  He was seen in July by Dr. Pearlean BrownieSethi after suffering a lacunar infarct.  The patient reports that he has not had any additional strokelike symptoms.  He is remained on aspirin.  Blood pressure is in normal range.  His wife reports that his cholesterol was recently checked and it was also in normal range.  She states that he is also been doing well at controlling his blood sugars.  She has noticed that his memory has declined since the stroke.  He requires assistance with ADLs.  He does not operate a motor vehicle.  Reports that he sleeps okay -he does use a CPAP that is managed by [facility].  Wife notes that he is more quiet and reserved since the stroke.  In the past he has tried Aricept and Namenda but was unable to tolerate these medications.  He returns today for evaluation.  HISTORY (copied from Dr.Sethi): [name] [name] is a [age] year Caucasian male seen today for the first office follow-up visit after hospital admission for stroke in July 2018. He is accompanied by his wife. History is obtained  from them as well as review of electronic medical records and have personally reviewed imaging films.[name] ShengCharles [name] Websteris an 84 y.o.malewho presented from home via EMS for AMS. His wife stated that he had been fatigued and diffusely weak for about 3-4 days PTA.Also developed confusion on Friday. Symptoms inculded dizziness, near syncope and decreased appetite.No speech deficit or focal weakness endorsed by his wife. The patient has a diagnosis of mild dementiaper his wife. MRI revealed a smallacute infarction affecting the left basal ganglia extending into the radiating white matter tracts.Atrophy and chronic small vessel ischemic changes were also noted. Carotid Doppler  showed no significant expectoration stenosis. Transthoracic echo showed normal ejection fraction. Transcranial Doppler showed low mean flow velocities in the right MCA and bilateral vertebral and basilar arteries due to poor waveforms from suboptimal windows. Lower extremity venous Dopplers are negative for DVT. LDL cholesterol was 57 mg percent. Hemoglobin A1c was elevated at 7.2. Patient states segment good recovery from physical standpoint and is able to walk at his baseline with a cane. However his memory seems to have gotten worse. He has no [name]-term memory difficulties now than before the stroke. Patient actually has a history of mild cognitive impairment and has been seen in our office previously by Dr. Kandyce Rudomeier. He also has some tremors. He is currently on aspirin which is tolerating well without bleeding but does get a few bruising easily. His blood pressure well controlled today it is 112/71. He states his sugars have well controlled though his hemoglobin A1c was elevated in the hospital. He is tolerating Zocor well without muscle aches and pains.    REVIEW OF SYSTEMS: Out of a complete 14 system review of symptoms, the patient complains only of the following symptoms, and all other reviewed systems are negative.  Runny nose, cough, wheezing, shortness of breath, leg swelling, restless leg, apnea, snoring, swollen abdomen, walking difficulty, rash, nervous/anxious, weakness, tremors, dizziness, memory loss  ALLERGIES: Allergies  Allergen Reactions  . Donepezil Shortness Of Breath and Swelling  . Lisinopril Anaphylaxis, Shortness Of Breath and Swelling    HAD TO COME TO THE E.D. AFTER TAKING THIS!!  . Teresa CoombsNamenda [Memantine Hcl] Shortness Of Breath  and Swelling  . Robaxin [Methocarbamol] Anaphylaxis, Shortness Of Breath and Swelling    HAD TO COME TO THE E.D. AFTER TAKING THIS!!    HOME MEDICATIONS: Outpatient Medications Prior to Visit  Medication Sig Dispense Refill  . aspirin EC 325  MG EC tablet Take 1 tablet (325 mg total) by mouth daily. 30 tablet 4  . B-D ULTRAFINE III [name] PEN 31G X 8 MM MISC   1  . Cholecalciferol (VITAMIN D-3 PO) Take 1,000 Units by mouth daily.     . citalopram (CELEXA) 20 MG tablet TAKE 1 TABLET (20 MG TOTAL) BY MOUTH DAILY. 90 tablet 3  . co-enzyme Q-10 30 MG capsule Take 30 mg by mouth daily.    Marland Kitchen doxazosin (CARDURA) 4 MG tablet Take 4 mg by mouth daily.    . folic acid (FOLVITE) 800 MCG tablet Take 800 mcg by mouth daily.    Marland Kitchen guaiFENesin (MUCINEX) 600 MG 12 hr tablet Take 1 tablet (600 mg total) by mouth 2 (two) times daily. For 10 days 60 tablet 0  . guaiFENesin-dextromethorphan (ROBITUSSIN DM) 100-10 MG/5ML syrup Take 5 mLs by mouth every 4 (four) hours as needed for cough. 118 mL 0  . insulin glargine (LANTUS SOLOSTAR) 100 UNIT/ML injection Inject 24 Units into the skin daily after supper.     . loratadine-pseudoephedrine (CLARITIN-D 24-HOUR) 10-240 MG per 24 hr tablet Take 1 tablet by mouth daily as needed for allergies.    . metFORMIN (GLUCOPHAGE) 1000 MG tablet Take 1,000 mg by mouth 2 (two) times daily with a meal.    . methotrexate 2.5 MG tablet Take 12.5 mg by mouth every Wednesday.     . Multiple Vitamin (MULTIVITAMIN WITH MINERALS) TABS Take 1 tablet by mouth daily.    Marland Kitchen omeprazole (PRILOSEC) 40 MG capsule Take 40 mg by mouth daily.  0  . simvastatin (ZOCOR) 40 MG tablet Take 40 mg by mouth daily.  0   No facility-administered medications prior to visit.     PAST MEDICAL HISTORY: Past Medical History:  Diagnosis Date  . Anxiety   . Dementia   . Diabetes mellitus   . Hyperlipidemia   . Obesity   . Sleep apnea    uses CPAP at night to sleep  . Stroke [facility])     PAST SURGICAL HISTORY: Past Surgical History:  Procedure Laterality Date  . cipap    . HERNIA REPAIR Right   . REPLACEMENT TOTAL KNEE Left    Dr Bethann Goo / Dr Fannie Knee  . TRANSURETHRAL RESECTION OF PROSTATE      FAMILY HISTORY: Family History  Problem Relation  Age of Onset  . Liver disease Mother   . Cancer - Lung Father        smoker    SOCIAL HISTORY: Social History   Socioeconomic History  . Marital status: Married    Spouse name: [name]  . Number of children: 2  . Years of education: 12th  . Highest education level: Not on file  Occupational History  . Occupation: retired  Engineer, production  . Financial resource strain: Not on file  . Food insecurity:    Worry: Not on file    Inability: Not on file  . Transportation needs:    Medical: Not on file    Non-medical: Not on file  Tobacco Use  . Smoking status: Never Smoker  . Smokeless tobacco: Never Used  Substance and Sexual Activity  . Alcohol use: No  . Drug use: No  .  Sexual activity: Not on file  Lifestyle  . Physical activity:    Days per week: Not on file    Minutes per session: Not on file  . Stress: Not on file  Relationships  . Social connections:    Talks on phone: Not on file    Gets together: Not on file    Attends religious service: Not on file    Active member of club or organization: Not on file    Attends meetings of clubs or organizations: Not on file    Relationship status: Not on file  . Intimate partner violence:    Fear of current or ex partner: Not on file    Emotionally abused: Not on file    Physically abused: Not on file    Forced sexual activity: Not on file  Other Topics Concern  . Not on file  Social History Narrative   Patient lives at home and drinks caffinated  Drinks daily. Married, 2 kids.  Caffeine 2-3 cups daily.    Hs Graduate.  Retired.        PHYSICAL EXAM  Vitals:   12/22/17 1256  BP: 129/78  Pulse: 77  Weight: 212 lb (96.2 kg)  Height: 5\' 9"  (1.753 m)   Body mass index is 31.31 kg/m.   MMSE - Mini Mental State Exam 12/22/2017 03/09/2017 06/04/2016  Orientation to time 1 4 3   Orientation to Place 3 5 5   Registration 3 3 3   Attention/ Calculation 1 5 5   Recall 1 2 3   Language- name 2 objects 2 2 2   Language- repeat 1  1 1   Language- follow 3 step command 2 3 3   Language- read & follow direction 0 1 1  Write a sentence - 1 1  Write a sentence-comments - - -  Copy design 1 1 1   Total score - 28 28    Generalized: Well developed, in no acute distress   Neurological examination  Mentation: Alert. Follows all commands speech and language fluent Cranial nerve II-XII: Pupils were equal round reactive to light. Extraocular movements were full, visual field were full on confrontational test. Facial sensation and strength were normal. Uvula tongue midline. Head turning and shoulder shrug  were normal and symmetric. Motor: The motor testing reveals 5 over 5 strength of all 4 extremities. Good symmetric motor tone is noted throughout.  Sensory: Sensory testing is intact to soft touch on all 4 extremities. No evidence of extinction is noted.  Coordination: Cerebellar testing reveals good finger-nose-finger and heel-to-shin bilaterally.  Gait and station: Patient uses a cane when ambulating.  Gait is slightly unsteady   DIAGNOSTIC DATA (LABS, IMAGING, TESTING) - I reviewed patient records, labs, notes, testing and imaging myself where available.  Lab Results  Component Value Date   WBC 7.4 04/06/2017   HGB 10.4 (L) 04/06/2017   HCT 32.1 (L) 04/06/2017   MCV 97.3 04/06/2017   PLT 213 04/06/2017      Component Value Date/Time   NA 137 04/06/2017 0409   K 4.0 04/06/2017 0409   CL 106 04/06/2017 0409   CO2 25 04/06/2017 0409   GLUCOSE 116 (H) 04/06/2017 0409   BUN 15 04/06/2017 0409   CREATININE 1.18 04/06/2017 0409   CALCIUM 8.4 (L) 04/06/2017 0409   PROT 6.2 (L) 04/04/2017 1446   ALBUMIN 3.5 04/04/2017 1446   AST 18 04/04/2017 1446   ALT 15 (L) 04/04/2017 1446   ALKPHOS 46 04/04/2017 1446   BILITOT 0.8 04/04/2017 1446  GFRNONAA 54 (L) 04/06/2017 0409   GFRAA >60 04/06/2017 0409   Lab Results  Component Value Date   CHOL 128 04/05/2017   HDL 51 04/05/2017   LDLCALC 57 04/05/2017   TRIG 99  04/05/2017   CHOLHDL 2.5 04/05/2017   Lab Results  Component Value Date   HGBA1C 7.2 (H) 04/04/2017   No results found for: VITAMINB12 No results found for: TSH    ASSESSMENT AND PLAN 84 y.o. year old male  has a past medical history of Anxiety, Dementia, Diabetes mellitus, Hyperlipidemia, Obesity, Sleep apnea, and Stroke (HCC). here with :  1.  Memory disturbance 2.  Lacunar infarct  The patient's memory score has declined since his visit in June.  We will continue to monitor.  He was unable to tolerate Aricept and Namenda.  The patient will continue on aspirin for stroke prevention.  He should maintain strict control of his blood pressure with goal less than 130/90, cholesterol LDL less than 70 and hemoglobin A1c less than 6.5%.  He is advised that if he has any strokelike symptoms he should call 911.  Advised patient that if his symptoms worsen or he develops new symptoms he should let us know.  We will follow-up in 6 months or sooner if needed.   Butch Penny, MSN, NP-C 12/22/2017, 1:03 PM [facility] [address] [place], [place] [address] [phone]

## 2017-12-22 NOTE — Patient Instructions (Signed)
Your Plan:  Continue Aspirin  BP <130/90 Cholesterol LDL <70 HbgA1c <6.5 %  Memory score has declined. We will continue to monitor If your symptoms worsen or you develop new symptoms please let us know.  [facility]. I hope we have been able to provide you high quality care today.  You may receive a patient satisfaction survey over the next few weeks. We would appreciate your feedback and comments so that we may continue to improve ourselves and the health of our patients.

## 2018-01-01 NOTE — Progress Notes (Signed)
I agree with the above plan 

## 2018-01-05 ENCOUNTER — Encounter: Payer: Self-pay | Admitting: Podiatry

## 2018-01-05 ENCOUNTER — Ambulatory Visit: Payer: Medicare Other | Admitting: Podiatry

## 2018-01-05 DIAGNOSIS — M79674 Pain in right toe(s): Secondary | ICD-10-CM | POA: Diagnosis not present

## 2018-01-05 DIAGNOSIS — M79675 Pain in left toe(s): Secondary | ICD-10-CM

## 2018-01-05 DIAGNOSIS — E119 Type 2 diabetes mellitus without complications: Secondary | ICD-10-CM | POA: Diagnosis not present

## 2018-01-05 DIAGNOSIS — B351 Tinea unguium: Secondary | ICD-10-CM

## 2018-01-05 NOTE — Progress Notes (Signed)
Complaint:  Visit Type: Patient returns to my office for continued preventative foot care services. Complaint: Patient states" my nails have grown long and thick and become painful to walk and wear shoes"  The patient presents for preventative foot care services. No changes to ROS  Podiatric Exam: Vascular: dorsalis pedis and posterior tibial pulses are palpable bilateral. Capillary return is immediate. Temperature gradient is WNL. Skin turgor WNL  Sensorium: Normal Semmes Weinstein monofilament test. Normal tactile sensation bilaterally. Nail Exam: Pt has thick disfigured discolored nails with subungual debris noted bilateral entire nail hallux through fifth toenails Ulcer Exam: There is no evidence of ulcer or pre-ulcerative changes or infection. Orthopedic Exam: Muscle tone and strength are WNL. No limitations in general ROM. No crepitus or effusions noted. HAV  B/L. Skin: No Porokeratosis. No infection or ulcers  Diagnosis:  Onychomycosis, , Pain in right toe, pain in left toes  Treatment & Plan Procedures and Treatment: Consent by patient was obtained for treatment procedures. The patient understood the discussion of treatment and procedures well. All questions were answered thoroughly reviewed. Debridement of mycotic and hypertrophic toenails, 1 through 5 bilateral and clearing of subungual debris. No ulceration, no infection noted.  Return Visit-Office Procedure: Patient instructed to return to the office for a follow up visit 3 months for continued evaluation and treatment.    Usher Hedberg DPM 

## 2018-01-25 NOTE — Progress Notes (Signed)
I agree with the assessment and plan as directed by NP .The patient is known to me .   Keary Hanak, MD  

## 2018-04-07 ENCOUNTER — Ambulatory Visit: Payer: Medicare Other | Admitting: Podiatry

## 2018-04-16 ENCOUNTER — Ambulatory Visit: Payer: Medicare Other | Admitting: Podiatry

## 2018-04-16 ENCOUNTER — Encounter: Payer: Self-pay | Admitting: Podiatry

## 2018-04-16 DIAGNOSIS — B351 Tinea unguium: Secondary | ICD-10-CM

## 2018-04-16 DIAGNOSIS — M79675 Pain in left toe(s): Secondary | ICD-10-CM

## 2018-04-16 DIAGNOSIS — M79674 Pain in right toe(s): Secondary | ICD-10-CM | POA: Diagnosis not present

## 2018-04-16 DIAGNOSIS — B353 Tinea pedis: Secondary | ICD-10-CM | POA: Diagnosis not present

## 2018-04-16 MED ORDER — CICLOPIROX OLAMINE 0.77 % EX CREA: TOPICAL_CREAM | Freq: Two times a day (BID) | CUTANEOUS | 0 refills | Status: AC

## 2018-04-16 MED ORDER — CICLOPIROX OLAMINE 0.77 % EX CREA: TOPICAL_CREAM | Freq: Two times a day (BID) | CUTANEOUS | 0 refills | Status: DC

## 2018-04-19 NOTE — Progress Notes (Signed)
Subjective: [name] [name] presents today, accompanied by his wife with diabetes and cc of painful, discolored, thick toenails which interfere with daily activities and routine tasks.  Pain is aggravated when wearing enclosed shoe gear. Pain is relieved with periodic professional debridement.  He nor his wife voice any new problems on today's visit.  Objective:  Vascular Examination: Capillary refill time immediate x 10 Dorsalis pedis and posterior tibial pulses present b/l No digital hair x 10 digits Skin temperature warm to warm b/l  Dermatological Examination: Skin with diffuse scaling noted plantarly and peripherally b/l Toenails 1-5 b/l discolored, thick, dystrophic with subungual debris and pain with palpation to nailbeds due to thickness of nails.  Musculoskeletal: Muscle strength 5/5 to all LE muscle groups Hallux valgus b/l  Neurological: Sensation intact with 10 gram monofilament. Vibratory sensation intact.  Assessment: Painful onychomycosis toenails 1-5 b/l  Tinea pedis b/l  Plan: 1. Toenails 1-5 b/l were debrided in length and girth without iatrogenic bleeding. 2. Discussed tinea pedis etiology and treatment options. Rx sent for Ciclopirox Cream 0.77% to be applied to both feet bid x 28 days. 3. Patient to continue soft, supportive shoe gear 4. Patient to report any pedal injuries to medical professional immediately. 5. Follow up 3 months.  6. Patient/POA to call should there be a concern in the interim.

## 2018-05-10 ENCOUNTER — Encounter (HOSPITAL_COMMUNITY): Payer: Self-pay | Admitting: *Deleted

## 2018-05-10 ENCOUNTER — Emergency Department (HOSPITAL_COMMUNITY)
Admission: EM | Admit: 2018-05-10 | Discharge: 2018-05-10 | Disposition: A | Discharge status: Home / Self Care | Payer: Medicare Other | Attending: Emergency Medicine | Admitting: Emergency Medicine

## 2018-05-10 ENCOUNTER — Emergency Department (HOSPITAL_COMMUNITY): Payer: Medicare Other

## 2018-05-10 DIAGNOSIS — E119 Type 2 diabetes mellitus without complications: Secondary | ICD-10-CM | POA: Insufficient documentation

## 2018-05-10 DIAGNOSIS — J181 Lobar pneumonia, unspecified organism: Secondary | ICD-10-CM | POA: Insufficient documentation

## 2018-05-10 DIAGNOSIS — Z794 Long term (current) use of insulin: Secondary | ICD-10-CM | POA: Insufficient documentation

## 2018-05-10 DIAGNOSIS — Z79899 Other long term (current) drug therapy: Secondary | ICD-10-CM | POA: Diagnosis not present

## 2018-05-10 DIAGNOSIS — R531 Weakness: Secondary | ICD-10-CM | POA: Diagnosis present

## 2018-05-10 DIAGNOSIS — Z7982 Long term (current) use of aspirin: Secondary | ICD-10-CM | POA: Diagnosis not present

## 2018-05-10 DIAGNOSIS — F0390 Unspecified dementia without behavioral disturbance: Secondary | ICD-10-CM | POA: Insufficient documentation

## 2018-05-10 DIAGNOSIS — J189 Pneumonia, unspecified organism: Secondary | ICD-10-CM

## 2018-05-10 LAB — COMPREHENSIVE METABOLIC PANEL
ALBUMIN: 3.8 g/dL (ref 3.5–5.0)
ALK PHOS: 45 U/L (ref 38–126)
ALT: 18 U/L (ref 0–44)
ANION GAP: 8 (ref 5–15)
AST: 17 U/L (ref 15–41)
BUN: 19 mg/dL (ref 8–23)
CALCIUM: 8.9 mg/dL (ref 8.9–10.3)
CHLORIDE: 104 mmol/L (ref 98–111)
CO2: 27 mmol/L (ref 22–32)
CREATININE: 1.25 mg/dL — AB (ref 0.61–1.24)
GFR calc Af Amer: 58 mL/min — ABNORMAL LOW (ref >60)
GFR calc non Af Amer: 50 mL/min — ABNORMAL LOW (ref >60)
GLUCOSE: 127 mg/dL — AB (ref 70–99)
Potassium: 4.4 mmol/L (ref 3.5–5.1)
SODIUM: 139 mmol/L (ref 135–145)
Total Bilirubin: 0.5 mg/dL (ref 0.3–1.2)
Total Protein: 6.5 g/dL (ref 6.5–8.1)

## 2018-05-10 LAB — URINALYSIS, ROUTINE W REFLEX MICROSCOPIC
BACTERIA UA: NONE SEEN
Bilirubin Urine: NEGATIVE
Glucose, UA: NEGATIVE mg/dL
Ketones, ur: NEGATIVE mg/dL
Leukocytes, UA: NEGATIVE
Nitrite: NEGATIVE
PROTEIN: NEGATIVE mg/dL
SPECIFIC GRAVITY, URINE: 1.019 (ref 1.005–1.030)
pH: 5 (ref 5.0–8.0)

## 2018-05-10 LAB — CBC WITH DIFFERENTIAL/PLATELET
BASOS PCT: 0 %
Basophils Absolute: 0 10*3/uL (ref 0.0–0.1)
EOS ABS: 0.1 10*3/uL (ref 0.0–0.7)
EOS PCT: 2 %
HCT: 34.3 % — ABNORMAL LOW (ref 39.0–52.0)
HEMOGLOBIN: 11.1 g/dL — AB (ref 13.0–17.0)
Lymphocytes Relative: 41 %
Lymphs Abs: 3 10*3/uL (ref 0.7–4.0)
MCH: 33.2 pg (ref 26.0–34.0)
MCHC: 32.4 g/dL (ref 30.0–36.0)
MCV: 102.7 fL — ABNORMAL HIGH (ref 78.0–100.0)
Monocytes Absolute: 0.5 10*3/uL (ref 0.1–1.0)
Monocytes Relative: 7 %
NEUTROS PCT: 50 %
Neutro Abs: 3.8 10*3/uL (ref 1.7–7.7)
PLATELETS: 248 10*3/uL (ref 150–400)
RBC: 3.34 MIL/uL — AB (ref 4.22–5.81)
RDW: 14.8 % (ref 11.5–15.5)
WBC: 7.4 10*3/uL (ref 4.0–10.5)

## 2018-05-10 LAB — I-STAT TROPONIN, ED: TROPONIN I, POC: 0.01 ng/mL (ref 0.00–0.08)

## 2018-05-10 LAB — I-STAT CG4 LACTIC ACID, ED: Lactic Acid, Venous: 1.61 mmol/L (ref 0.5–1.9)

## 2018-05-10 MED ORDER — LEVOFLOXACIN 500 MG PO TABS: 500.0000 mg | ORAL_TABLET | Freq: Every day | ORAL | 0 refills | Status: DC

## 2018-05-10 MED ORDER — SODIUM CHLORIDE 0.9 % IV BOLUS
1000.0000 mL | Freq: Once | INTRAVENOUS | Status: AC
  Administered 2018-05-10: 1000 mL via INTRAVENOUS

## 2018-05-10 MED ORDER — LEVOFLOXACIN IN D5W 500 MG/100ML IV SOLN
500.0000 mg | Freq: Once | INTRAVENOUS | Status: AC
  Administered 2018-05-10: 500 mg via INTRAVENOUS
  Filled 2018-05-10: qty 100

## 2018-05-10 NOTE — ED Triage Notes (Signed)
Per EMS, pt from home. Family reports pt has been feeling weak. Pt felt warm for EMS. Pt does not have complaints. Pt has hx of dementia.   BP 148/78 HR 60 O2 96% RR 18 CBG 174

## 2018-05-10 NOTE — Discharge Instructions (Signed)
I have prescribed levaquin to your pharmacy.   See your doctor  Stay hydrated  Take tylenol for fever   Return to ER if you have worse weakness, fever, vomiting, trouble breathing

## 2018-05-10 NOTE — ED Provider Notes (Signed)
[facility]-EMERGENCY DEPT Provider Note   CSN: [id number] Arrival date & time: 05/10/18  1802     History   Chief Complaint Chief Complaint  Patient presents with  . Weakness    HPI [name] [name] is a 84 y.o. male history of dementia, diabetes, hyperlipidemia, stroke here presenting with chills, weakness, subjective fevers.  Patient is living at home with his family.  This afternoon, patient was noted to have subjective fevers and chills.  Wife was trying to take his temperature but was unable to.  He also has generalized weakness as well.  Denies any trouble speaking but just has a hard time walking. Patient walks with a cane at baseline. Patient was admitted for pneumonia about a year ago with similar symptoms.   The history is provided by the patient.    Past Medical History:  Diagnosis Date  . Anxiety   . Dementia   . Diabetes mellitus   . Hyperlipidemia   . Obesity   . Sleep apnea    uses CPAP at night to sleep  . Stroke [facility])     Patient Active Problem List   Diagnosis Date Noted  . Stroke (cerebrum) (HCC)   . Acute CVA (cerebrovascular accident) (HCC) 04/05/2017  . CAP (community acquired pneumonia) 04/04/2017  . Acute metabolic encephalopathy 04/04/2017  . Dementia 04/04/2017  . Diabetes mellitus (HCC) 04/04/2017  . Hyperlipidemia 04/04/2017  . Change in bowel habits 11/06/2016  . Umbilical hernia without obstruction and without gangrene 11/06/2016  . 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 Essential tremor 08/20/2015  . MCI (mild cognitive impairment) with memory loss 08/20/2015  . Memory loss 02/13/2015  . OBSTRUCTIVE SLEEP APNEA 07/19/2008    Past Surgical History:  Procedure Laterality Date  . cipap    . HERNIA REPAIR Right   . REPLACEMENT TOTAL KNEE Left    Dr Bethann Goo / Dr Fannie Knee  . TRANSURETHRAL RESECTION OF PROSTATE          Home Medications    Prior to Admission medications   Medication Sig Start Date End Date Taking?  Authorizing Provider  ACCU-CHEK SMARTVIEW test strip USE ONCE A DAY AS DIRECTED 02/17/18   [provider]  aspirin EC 325 MG EC tablet Take 1 tablet (325 mg total) by mouth daily. 04/06/17   Rai, Delene Ruffini, MD  B-D ULTRAFINE III SHORT PEN 31G X 8 MM MISC  12/28/14   [provider]  Cholecalciferol (VITAMIN D-3 PO) Take 1,000 Units by mouth daily.     [provider]  ciclopirox (LOPROX) 0.77 % cream Apply topically 2 (two) times daily for 28 days. Use as directed 04/16/18 05/14/18  Freddie Breech, MD  citalopram (CELEXA) 20 MG tablet TAKE 1 TABLET (20 MG TOTAL) BY MOUTH DAILY. 08/04/16   Dohmeier, Porfirio Mylar, MD  co-enzyme Q-10 30 MG capsule Take 30 mg by mouth daily.    [provider]  doxazosin (CARDURA) 4 MG tablet Take 4 mg by mouth daily.    [provider]  folic acid (FOLVITE) 800 MCG tablet Take 800 mcg by mouth daily.    [provider]  guaiFENesin (MUCINEX) 600 MG 12 hr tablet Take 1 tablet (600 mg total) by mouth 2 (two) times daily. For 10 days 04/06/17   Rai, Delene Ruffini, MD  guaiFENesin-dextromethorphan (ROBITUSSIN DM) 100-10 MG/5ML syrup Take 5 mLs by mouth every 4 (four) hours as needed for cough. 04/06/17   Rai, Delene Ruffini, MD  insulin glargine (LANTUS  SOLOSTAR) 100 UNIT/ML injection Inject 24 Units into the skin daily after supper.     [provider]  loratadine-pseudoephedrine (CLARITIN-D 24-HOUR) 10-240 MG per 24 hr tablet Take 1 tablet by mouth daily as needed for allergies.    [provider]  metFORMIN (GLUCOPHAGE) 1000 MG tablet Take 1,000 mg by mouth 2 (two) times daily with a meal.    [provider]  methotrexate 2.5 MG tablet Take 12.5 mg by mouth every Wednesday.     [provider]  Multiple Vitamin (MULTIVITAMIN WITH MINERALS) TABS Take 1 tablet by mouth daily.    [provider]  omeprazole (PRILOSEC) 40 MG capsule Take 40 mg by mouth daily. 12/06/14   [provider]  simvastatin (ZOCOR) 40 MG tablet Take 40 mg by mouth daily. 08/03/15   [provider]    Family History Family History  Problem Relation Age of Onset  . Liver disease Mother   . Cancer - Lung Father        smoker    Social History Social History   Tobacco Use  . Smoking status: Never Smoker  . Smokeless tobacco: Never Used  Substance Use Topics  . Alcohol use: No  . Drug use: No     Allergies   Donepezil; Lisinopril; Namenda [memantine hcl]; and Robaxin [methocarbamol]   Review of Systems Review of Systems  Constitutional: Positive for chills.  Respiratory: Positive for cough.   Neurological: Positive for weakness.  All other systems reviewed and are negative.    Physical Exam Updated Vital Signs BP (!) 152/84 (BP Location: Left Arm)   Pulse 66   Temp 98.4 F (36.9 C) (Oral)   Resp (!) 21   SpO2 98%   Physical Exam  Constitutional: He is oriented to person, place, and time. He appears well-developed and well-nourished.  HENT:  Head: Normocephalic.  MM slightly dry   Eyes: Pupils are equal, round, and reactive to light. Conjunctivae and EOM are normal.  Neck: Normal range of motion. Neck supple.  Cardiovascular: Normal rate, regular rhythm and normal heart sounds.  Pulmonary/Chest:  Crackles L base   Abdominal: Soft. Bowel sounds are normal. He exhibits no distension. There is no tenderness.  Musculoskeletal: Normal range of motion.  Neurological: He is alert and oriented to person, place, and time. No cranial nerve deficit. Coordination normal.  Skin: Skin is warm.  Psychiatric: He has a normal mood and affect.  Nursing note and vitals reviewed.    ED Treatments / Results  Labs (all labs ordered are listed, but only abnormal results are displayed) Labs Reviewed  CBC WITH DIFFERENTIAL/PLATELET - Abnormal; Notable for the following components:      Result Value   RBC 3.34 (*)    Hemoglobin 11.1 (*)    HCT 34.3 (*)    MCV 102.7 (*)     All other components within normal limits  COMPREHENSIVE METABOLIC PANEL - Abnormal; Notable for the following components:   Glucose, Bld 127 (*)    Creatinine, Ser 1.25 (*)    GFR calc non Af Amer 50 (*)    GFR calc Af Amer 58 (*)    All other components within normal limits  URINALYSIS, ROUTINE W REFLEX MICROSCOPIC - Abnormal; Notable for the following components:   Hgb urine dipstick SMALL (*)    All other components within normal limits  CULTURE, BLOOD (ROUTINE X 2)  CULTURE, BLOOD (ROUTINE X 2)  URINE CULTURE  I-STAT CG4 LACTIC ACID,  ED  I-STAT TROPONIN, ED    EKG EKG Interpretation  Date/Time:  Monday May 10 2018 18:28:39 EDT Ventricular Rate:  64 PR Interval:    QRS Duration: 126 QT Interval:  406 QTC Calculation: 419 R Axis:   -16 Text Interpretation:  Sinus rhythm Nonspecific intraventricular conduction delay No significant change since last tracing Confirmed by Richardean Canal [id number]) on 05/10/2018 7:30:34 PM   Radiology Dg Chest 2 View  Result Date: 05/10/2018 CLINICAL DATA:  Weakness.  History of dementia. EXAM: CHEST - 2 VIEW COMPARISON:  Chest radiograph April 04, 2017 FINDINGS: Stable cardiomegaly. Calcified aortic arch. Mediastinal silhouette is not suspicious. Mild pulmonary vascular congestion without pleural effusion. RIGHT lung base nipple shadow. Strandy densities LEFT lung base. No pneumothorax. Severe shoulder osteoarthrosis. IMPRESSION: 1. Stable cardiomegaly.  Mild pulmonary vascular congestion. 2. Similar LEFT lung base atelectasis/scarring. Electronically Signed   By: Awilda Metro M.D.   On: 05/10/2018 18:50    Procedures Procedures (including critical care time)  Medications Ordered in ED Medications  sodium chloride 0.9 % bolus 1,000 mL (1,000 mLs Intravenous New Bag/Given 05/10/18 1845)  levofloxacin (LEVAQUIN) IVPB 500 mg (500 mg Intravenous New Bag/Given 05/10/18 1917)     Initial Impression / Assessment and Plan / ED Course  I have  reviewed the triage vital signs and the nursing notes.  Pertinent labs & imaging results that were available during my care of the patient were reviewed by me and considered in my medical decision making (see chart for details).     [name] [name] is a 84 y.o. male here with weakness, chills, subjective fevers. Afebrile in the ED. Well appearing. Nl neuro exam and I doubt stroke. I think likely pneumonia vs UTI vs electrolyte abnormalities. Will get labs, CXR, UA. Will hydrate and reassess.   8:46 PM WBC nl. Lactate nl. CXR showed possible L lower lobe atelectasis vs pneumonia. Vitals stable. Given levaquin in the ED. Will dc home with the same.   Final Clinical Impressions(s) / ED Diagnoses   Final diagnoses:  None    ED Discharge Orders    None       Charlynne Pander, MD 05/10/18 2047

## 2018-05-12 LAB — URINE CULTURE

## 2018-05-15 LAB — CULTURE, BLOOD (ROUTINE X 2)
Culture: NO GROWTH
Culture: NO GROWTH
Special Requests: ADEQUATE

## 2018-07-07 ENCOUNTER — Ambulatory Visit: Payer: Medicare Other | Admitting: Adult Health

## 2018-07-14 ENCOUNTER — Ambulatory Visit: Payer: Medicare Other | Admitting: Adult Health

## 2018-07-15 ENCOUNTER — Encounter: Payer: Self-pay | Admitting: Adult Health

## 2018-07-15 ENCOUNTER — Ambulatory Visit: Payer: Medicare Other | Admitting: Podiatry

## 2018-07-23 ENCOUNTER — Ambulatory Visit: Payer: Medicare Other | Admitting: Podiatry

## 2018-07-23 DIAGNOSIS — M79675 Pain in left toe(s): Secondary | ICD-10-CM

## 2018-07-23 DIAGNOSIS — M79674 Pain in right toe(s): Secondary | ICD-10-CM

## 2018-07-23 DIAGNOSIS — B351 Tinea unguium: Secondary | ICD-10-CM

## 2018-07-23 NOTE — Patient Instructions (Signed)

## 2018-08-09 ENCOUNTER — Encounter: Payer: Self-pay | Admitting: Podiatry

## 2018-08-09 NOTE — Progress Notes (Signed)
Subjective: Garnett Farmharles [name] Tinnell presents today for followup with painful, thick toenails 1-5 b/l that he cannot cut and which interfere with daily activities.  Pain is aggravated when wearing enclosed shoe gear.  Objective:  Vascular Examination: Capillary refill time immediate x 10 digits Dorsalis pedis and Posterior tibial pulses present b/l Digital hair x 10 digits absent Skin temperature gradient WNL b/l  Dermatological Examination: Skin with normal turgor, texture and tone b/l  Toenails 1-5 b/l discolored, thick, dystrophic with subungual debris and pain with palpation to nailbeds due to thickness of nails.  Musculoskeletal: Muscle strength 5/5 to all LE muscle groups  Neurological: Sensation intact with 10 gram monofilament. Vibratory sensation intact.  Assessment: Painful onychomycosis toenails 1-5 b/l   Plan: 1. Toenails 1-5 b/l were debrided in length and girth without iatrogenic bleeding. 2. Patient to continue soft, supportive shoe gear 3. Patient to report any pedal injuries to medical professional immediately. 4. Follow up 3 months. Patient/POA to call should there be a concern in the interim.

## 2018-10-13 ENCOUNTER — Encounter

## 2018-10-13 ENCOUNTER — Ambulatory Visit: Payer: Medicare Other | Admitting: Neurology

## 2018-10-13 ENCOUNTER — Encounter: Payer: Self-pay | Admitting: Neurology

## 2018-10-13 VITALS — BP 126/71 | HR 71 | Ht 69.5 in | Wt 211.0 lb

## 2018-10-13 DIAGNOSIS — G301 Alzheimer's disease with late onset: Secondary | ICD-10-CM | POA: Diagnosis not present

## 2018-10-13 DIAGNOSIS — G250 Essential tremor: Secondary | ICD-10-CM

## 2018-10-13 DIAGNOSIS — F0280 Dementia in other diseases classified elsewhere without behavioral disturbance: Secondary | ICD-10-CM

## 2018-10-13 DIAGNOSIS — H53412 Scotoma involving central area, left eye: Secondary | ICD-10-CM

## 2018-10-13 HISTORY — DX: Scotoma involving central area, left eye: H53.412

## 2018-10-13 HISTORY — DX: Essential tremor: G25.0

## 2018-10-13 HISTORY — DX: Dementia in other diseases classified elsewhere, unspecified severity, without behavioral disturbance, psychotic disturbance, mood disturbance, and anxiety: F02.80

## 2018-10-13 NOTE — Progress Notes (Signed)
PATIENT: [name] [name] DOB: 1929-02-19  REASON FOR VISIT: follow up HISTORY FROM: patient  HISTORY OF PRESENT ILLNESS:  Today 10/13/18. Seeing [name] and Mrs. Ake again. [name] [name] has been followed by as for many years now, he endorsed a mild level of depression on the geriatric depression score, he achieved on the Mini-Mental status exam today 24 out of 30 points.  He has mainly trouble with short-term memory and so he recalled only 1 of 3 words, but he was able to spell backwards he was not quite sure about the complete date and not even about the year but he knew the season and he remembers where he is and with whom. Wife reported decline of ST-Memory. He has a jaw tremor and hand tremor, bilaterally but he is left hand dominant and only remarked on his left hand clumsiness, he has a gait  problem, worse after Pneumonia in 04-2018.     [name] [name] is an 85 year old male with a history of progressive memory disturbance.  He was seen in July 2018 by Dr. Pearlean BrownieSethi after suffering a lacunar infarct.  The patient reports that he has not had any additional strokelike symptoms.  He is remained on aspirin.  Blood pressure is in normal range.  His wife reports that his cholesterol was recently checked and it was also in normal range.  She states that he is also been doing well at controlling his blood sugars.  She has noticed that his memory has declined since the stroke.  He requires assistance with ADLs.  He does not operate a motor vehicle.  Reports that he sleeps okay -he does [facility].  Wife notes that he is more quiet and reserved since the stroke.  In the past he has tried Aricept and Namenda but was unable to tolerate these medications.  He returns today for evaluation.  HISTORY (copied from Dr.Sethi): [name] [name] is a [age] year Caucasian male seen today for the first office follow-up visit after hospital admission for stroke in July 2018. He is  accompanied by his wife. History is obtained  from them as well as review of electronic medical records and have personally reviewed imaging films.[name] ShengCharles [name] Websteris an 85 y.o.malewho presented from home via EMS for AMS. His wife stated that he had been fatigued and diffusely weak for about 3-4 days PTA.Also developed confusion on Friday. Symptoms inculded dizziness, near syncope and decreased appetite.No speech deficit or focal weakness endorsed by his wife. The patient has a diagnosis of mild dementiaper his wife. MRI revealed a smallacute infarction affecting the left basal ganglia extending into the radiating white matter tracts.Atrophy and chronic small vessel ischemic changes were also noted. Carotid Doppler showed no significant expectoration stenosis. Transthoracic echo showed normal ejection fraction. Transcranial Doppler showed low mean flow velocities in the right MCA and bilateral vertebral and basilar arteries due to poor waveforms from suboptimal windows. Lower extremity venous Dopplers are negative for DVT. LDL cholesterol was 57 mg percent. Hemoglobin A1c was elevated at 7.2. Patient states segment good recovery from physical standpoint and is able to walk at his baseline with a cane. However his memory seems to have gotten worse. He has no short-term memory difficulties now than before the stroke. Patient actually has a history of mild cognitive impairment and has been seen in our office previously by Dr. Kandyce Rudomeier. He also has some tremors. He is currently on aspirin which is tolerating well without bleeding  but does get a few bruising easily. His blood pressure well controlled today it is 112/71. He states his sugars have well controlled though his hemoglobin A1c was elevated in the hospital. He is tolerating Zocor well without muscle aches and pains.    REVIEW OF SYSTEMS: Out of a complete 14 system review of symptoms, the patient complains only of the following symptoms, and all  other reviewed systems are negative.  Runny nose, cough, wheezing, shortness of breath, leg swelling, restless leg, apnea, snoring, swollen abdomen, walking difficulty, rash, nervous/anxious, weakness, tremors, dizziness, memory loss  ALLERGIES: Allergies  Allergen Reactions  . Donepezil Shortness Of Breath and Swelling  . Lisinopril Anaphylaxis, Shortness Of Breath and Swelling    HAD TO COME TO THE E.D. AFTER TAKING THIS!!  . [name] Hcl] Shortness Of Breath and Swelling  . Robaxin [Methocarbamol] Anaphylaxis, Shortness Of Breath and Swelling    HAD TO COME TO THE E.D. AFTER TAKING THIS!!    HOME MEDICATIONS: Outpatient Medications Prior to Visit  Medication Sig Dispense Refill  . ACCU-CHEK SMARTVIEW test strip USE ONCE A DAY AS DIRECTED  1  . aspirin EC 325 MG EC tablet Take 1 tablet (325 mg total) by mouth daily. 30 tablet 4  . B-D ULTRAFINE III SHORT PEN 31G X 8 MM MISC   1  . Cholecalciferol (VITAMIN D-3 PO) Take 1,000 Units by mouth daily.     . citalopram (CELEXA) 20 MG tablet TAKE 1 TABLET (20 MG TOTAL) BY MOUTH DAILY. 90 tablet 3  . doxazosin (CARDURA) 4 MG tablet Take 4 mg by mouth daily.    . folic acid (FOLVITE) 800 MCG tablet Take 800 mcg by mouth daily.    Marland Kitchen guaiFENesin-dextromethorphan (ROBITUSSIN DM) 100-10 MG/5ML syrup Take 5 mLs by mouth every 4 (four) hours as needed for cough. 118 mL 0  . insulin glargine (LANTUS SOLOSTAR) 100 UNIT/ML injection Inject 22 Units into the skin daily after supper.     . loratadine-pseudoephedrine (CLARITIN-D 24-HOUR) 10-240 MG per 24 hr tablet Take 1 tablet by mouth daily as needed for allergies.    . metFORMIN (GLUCOPHAGE) 1000 MG tablet Take 1,000 mg by mouth 2 (two) times daily with a meal.    . methotrexate 2.5 MG tablet Take 12.5 mg by mouth See admin instructions. Every Thursday    . Multiple Vitamin (MULTIVITAMIN WITH MINERALS) TABS Take 1 tablet by mouth daily.    Marland Kitchen omeprazole (PRILOSEC) 40 MG capsule Take 40 mg by  mouth daily.  0  . simvastatin (ZOCOR) 40 MG tablet Take 40 mg by mouth daily.  0  . guaiFENesin (MUCINEX) 600 MG 12 hr tablet Take 1 tablet (600 mg total) by mouth 2 (two) times daily. For 10 days 60 tablet 0  . levofloxacin (LEVAQUIN) 500 MG tablet Take 1 tablet (500 mg total) by mouth daily. 7 tablet 0   No facility-administered medications prior to visit.     PAST MEDICAL HISTORY: Past Medical History:  Diagnosis Date  . Anxiety   . Dementia (HCC)   . Diabetes mellitus   . Hyperlipidemia   . Obesity   . PNA (pneumonia)   . Sleep apnea    uses CPAP at night to sleep  . Stroke [facility])     PAST SURGICAL HISTORY: Past Surgical History:  Procedure Laterality Date  . cipap    . HERNIA REPAIR Right   . REPLACEMENT TOTAL KNEE Left    Dr Bethann Goo / Dr Fannie Knee  .  TRANSURETHRAL RESECTION OF PROSTATE      FAMILY HISTORY: Family History  Problem Relation Age of Onset  . Liver disease Mother   . Cancer - Lung Father        smoker    SOCIAL HISTORY: Social History   Socioeconomic History  . Marital status: Married    Spouse name: [name]  . Number of children: 2  . Years of education: 12th  . Highest education level: Not on file  Occupational History  . Occupation: retired  Engineer, production  . Financial resource strain: Not on file  . Food insecurity:    Worry: Not on file    Inability: Not on file  . Transportation needs:    Medical: Not on file    Non-medical: Not on file  Tobacco Use  . Smoking status: Never Smoker  . Smokeless tobacco: Never Used  Substance and Sexual Activity  . Alcohol use: No  . Drug use: No  . Sexual activity: Not on file  Lifestyle  . Physical activity:    Days per week: Not on file    Minutes per session: Not on file  . Stress: Not on file  Relationships  . Social connections:    Talks on phone: Not on file    Gets together: Not on file    Attends religious service: Not on file    Active member of club or organization: Not on file     Attends meetings of clubs or organizations: Not on file    Relationship status: Not on file  . Intimate partner violence:    Fear of current or ex partner: Not on file    Emotionally abused: Not on file    Physically abused: Not on file    Forced sexual activity: Not on file  Other Topics Concern  . Not on file  Social History Narrative   Patient lives at home and drinks caffinated  Drinks daily. Married, 2 kids.  Caffeine 2-3 cups daily.    Hs Graduate.  Retired.        PHYSICAL EXAM  Vitals:   10/13/18 1130  BP: 126/71  Pulse: 71  Weight: 211 lb (95.7 kg)  Height: 5' 9.5" (1.765 m)   Body mass index is 30.71 kg/m.   MMSE - Mini Mental State Exam 10/13/2018 12/22/2017 03/09/2017  Orientation to time 2 1 4   Orientation to Place 4 3 5   Registration 3 3 3   Attention/ Calculation 5 1 5   Attention/Calculation-comments unable to do numbers, did world backwards - -  Recall 1 1 2   Language- name 2 objects 2 2 2   Language- repeat 1 1 1   Language- follow 3 step command 3 2 3   Language- read & follow direction 1 0 1  Write a sentence 1 0 1  Write a sentence-comments - - -  Copy design 1 1 1   Total score 24 15 28     Generalized: Well developed, in no acute distress   Neurological examination  Mentation: Alert. Follows all commands speech and language fluent. Dysphonia. Slight lisp.   Cranial nerve: titubation.  Pupils are unequal , right dilated post surgical-  sluggishly reactive to light. Not to accomodation. Ptosis on the left   Right eye vision very poor, no central vision.  Facial sensation normal.  tongue midline. Head turning and shoulder shrug were affected by titubation.   Motor:  Elevated , waxing motor tone, bilaterally atrophy of the snuffbox and some interdigital muscles. No cog-wheeling but  clumsiness and fine amplitude tremor. At rest and with action.   Sensory: Sensory testing is intact to soft touch on all 4 extremities. No evidence of extinction is noted.    Coordination: known ataxia of gait .   Gait and station: Patient uses a 4 prong cane when ambulating. Gait is unsteady   DIAGNOSTIC DATA (LABS, IMAGING, TESTING) - I reviewed patient records, labs, notes, testing and imaging myself where available.  Pneumonia 2019 Stroke 2018   PT at home   Lab Results  Component Value Date   WBC 7.4 05/10/2018   HGB 11.1 (L) 05/10/2018   HCT 34.3 (L) 05/10/2018   MCV 102.7 (H) 05/10/2018   PLT 248 05/10/2018      Component Value Date/Time   NA 139 05/10/2018 1830   K 4.4 05/10/2018 1830   CL 104 05/10/2018 1830   CO2 27 05/10/2018 1830   GLUCOSE 127 (H) 05/10/2018 1830   BUN 19 05/10/2018 1830   CREATININE 1.25 (H) 05/10/2018 1830   CALCIUM 8.9 05/10/2018 1830   PROT 6.5 05/10/2018 1830   ALBUMIN 3.8 05/10/2018 1830   AST 17 05/10/2018 1830   ALT 18 05/10/2018 1830   ALKPHOS 45 05/10/2018 1830   BILITOT 0.5 05/10/2018 1830   GFRNONAA 50 (L) 05/10/2018 1830   GFRAA 58 (L) 05/10/2018 1830   Lab Results  Component Value Date   CHOL 128 04/05/2017   HDL 51 04/05/2017   LDLCALC 57 04/05/2017   TRIG 99 04/05/2017   CHOLHDL 2.5 04/05/2017   Lab Results  Component Value Date   HGBA1C 7.2 (H) 04/04/2017   No results found for: VITAMINB12 No results found for: TSH    ASSESSMENT AND PLAN 85 y.o. year old male here with :  1.  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  Lacunar infarct 3. Senile tremor. 4   Gait ataxia.   The patient's memory score has declined since his visit in June.  We will continue to monitor.  He was unable to tolerate Aricept and Namenda.  The patient will continue on aspirin 325 mg  for stroke prevention.  Celexa 40 mg for depression. Encouraged Social contacts, visiting angels provides transportation.    Develops new symptoms he should let us know.   We will follow-up in 6 months or sooner if needed. Next visit with MD or NP= Encourage physical activity.    Melvyn Novas, MD  10/13/2018, 11:55 AM [facility] [address] [place], [place] [address] [phone]

## 2018-10-22 ENCOUNTER — Ambulatory Visit: Payer: Medicare Other | Admitting: Podiatry

## 2018-10-25 ENCOUNTER — Encounter: Payer: Self-pay | Admitting: Podiatry

## 2018-10-25 ENCOUNTER — Ambulatory Visit: Payer: Medicare Other | Admitting: Podiatry

## 2018-10-25 DIAGNOSIS — M79674 Pain in right toe(s): Secondary | ICD-10-CM | POA: Diagnosis not present

## 2018-10-25 DIAGNOSIS — B351 Tinea unguium: Secondary | ICD-10-CM | POA: Diagnosis not present

## 2018-10-25 DIAGNOSIS — M79675 Pain in left toe(s): Secondary | ICD-10-CM

## 2018-10-25 NOTE — Progress Notes (Signed)
Subjective: [name] [name] presents today with painful, thick toenails 1-5 b/l that he cannot cut and which interfere with daily activities.  Pain is aggravated when wearing enclosed shoe gear.  [name], MD is his PCP.  [name] [name] voices no new pedal concerns on today's visit.   Current Outpatient Medications:  .  ACCU-CHEK SMARTVIEW test strip, USE ONCE A DAY AS DIRECTED, Disp: , Rfl: 1 .  aspirin EC 325 MG EC tablet, Take 1 tablet (325 mg total) by mouth daily., Disp: 30 tablet, Rfl: 4 .  B-D ULTRAFINE III SHORT PEN 31G X 8 MM MISC, , Disp: , Rfl: 1 .  Cholecalciferol (VITAMIN D-3 PO), Take 1,000 Units by mouth daily. , Disp: , Rfl:  .  citalopram (CELEXA) 20 MG tablet, TAKE 1 TABLET (20 MG TOTAL) BY MOUTH DAILY., Disp: 90 tablet, Rfl: 3 .  doxazosin (CARDURA) 4 MG tablet, Take 4 mg by mouth daily., Disp: , Rfl:  .  folic acid (FOLVITE) 800 MCG tablet, Take 800 mcg by mouth daily., Disp: , Rfl:  .  guaiFENesin-dextromethorphan (ROBITUSSIN DM) 100-10 MG/5ML syrup, Take 5 mLs by mouth every 4 (four) hours as needed for cough., Disp: 118 mL, Rfl: 0 .  insulin glargine (LANTUS SOLOSTAR) 100 UNIT/ML injection, Inject 22 Units into the skin daily after supper. , Disp: , Rfl:  .  loratadine-pseudoephedrine (CLARITIN-D 24-HOUR) 10-240 MG per 24 hr tablet, Take 1 tablet by mouth daily as needed for allergies., Disp: , Rfl:  .  metFORMIN (GLUCOPHAGE) 1000 MG tablet, Take 1,000 mg by mouth 2 (two) times daily with a meal., Disp: , Rfl:  .  methotrexate 2.5 MG tablet, Take 12.5 mg by mouth See admin instructions. Every Thursday, Disp: , Rfl:  .  Multiple Vitamin (MULTIVITAMIN WITH MINERALS) TABS, Take 1 tablet by mouth daily., Disp: , Rfl:  .  omeprazole (PRILOSEC) 40 MG capsule, Take 40 mg by mouth daily., Disp: , Rfl: 0 .  simvastatin (ZOCOR) 40 MG tablet, Take 40 mg by mouth daily., Disp: , Rfl: 0  Allergies  Allergen Reactions  . Donepezil Shortness Of Breath and Swelling  .  Lisinopril Anaphylaxis, Shortness Of Breath and Swelling    HAD TO COME TO THE E.D. AFTER TAKING THIS!!  . [name] Hcl] Shortness Of Breath and Swelling  . Robaxin [Methocarbamol] Anaphylaxis, Shortness Of Breath and Swelling    HAD TO COME TO THE E.D. AFTER TAKING THIS!!    Objective:  Vascular Examination: Capillary refill time immediate x 10 digits  Dorsalis pedis and Posterior tibial pulses palpable b/l  Digital hair absentx 10 digits  Skin temperature gradient WNL b/l  Trace edema b/l LE  Dermatological Examination: Skin with normal turgor, texture and tone b/l  Toenails 1-5 b/l discolored, thick, dystrophic with subungual debris and pain with palpation to nailbeds due to thickness of nails.  Musculoskeletal: Muscle strength 5/5 to all LE muscle groups  No gross bony deformities b/l.  No pain, crepitus or joint limitation noted with ROM.   Neurological: Sensation intact with 10 gram monofilament.  Vibratory sensation intact.  Assessment: Painful onychomycosis toenails 1-5 b/l   Plan: 1. Toenails 1-5 b/l were debrided in length and girth without iatrogenic bleeding. 2. Patient to continue soft, supportive shoe gear 3. Patient to report any pedal injuries to medical professional immediately. 4. Follow up 3 months.  5. Patient/POA to call should there be a concern in the interim.

## 2018-10-25 NOTE — Patient Instructions (Signed)

## 2019-01-25 ENCOUNTER — Ambulatory Visit: Payer: Medicare Other | Admitting: Podiatry

## 2019-04-08 ENCOUNTER — Telehealth: Payer: Self-pay | Admitting: Neurology

## 2019-04-08 NOTE — Telephone Encounter (Signed)
Pt's wife called wanting to know if the pt's appt on Wednesday can be a Tele Visit due to them being elderly and not wanting to come out due to the COVID-19. Please advise.

## 2019-04-11 NOTE — Telephone Encounter (Signed)
Called the wife back to advise her that if Mr. Pat was stable. The wife states that has been staying home in the midst of everything. She feels that he is doing as well as can be expected. The wife states that he is stable. We have rescheduled his apt 05/17/2019 at 1 pm.  Patient asked if over the counter prevagen is safe for him to take for him memory. He is currently not on anything. Advised In looking a it I dont think it would be contraindicated but I will check with dr Dohmeier and make sure and call them back. She was appreciative,.

## 2019-04-12 NOTE — Telephone Encounter (Signed)
I called pt, spoke to pt's wife, Inez Catalina, per DPR. I advised her that Dr. Brett Fairy does not believe prevagen is contraindicated for pt. Pt's wife verbalized understanding. Pt's wife had no questions at this time but was encouraged to call back if questions arise.

## 2019-04-12 NOTE — Telephone Encounter (Signed)
No contra-indication for Prevagen.

## 2019-04-13 ENCOUNTER — Ambulatory Visit: Payer: Medicare Other | Admitting: Neurology

## 2019-05-17 ENCOUNTER — Ambulatory Visit (INDEPENDENT_AMBULATORY_CARE_PROVIDER_SITE_OTHER): Payer: Medicare Other | Admitting: Adult Health

## 2019-05-17 DIAGNOSIS — G301 Alzheimer's disease with late onset: Secondary | ICD-10-CM

## 2019-05-17 DIAGNOSIS — F0280 Dementia in other diseases classified elsewhere without behavioral disturbance: Secondary | ICD-10-CM

## 2019-05-17 NOTE — Progress Notes (Signed)
  [facility] [address] [place]. [place] [address] [phone]     Virtual Visit via Telephone Note  I connected with [name] [name] on 05/17/19 at  1:00 PM EDT by telephone located remotely at [facility] and verified that I am speaking with the correct person using two identifiers who reports being located at home.    Visit scheduled by RN. She discussed the limitations, risks, security and privacy concerns of performing an evaluation and management service by telephone and the availability of in person appointments. I also discussed with the patient that there may be a patient responsible charge related to this service. The patient expressed understanding and agreed to proceed. See telephone note for consent and additional scheduling information.    History of Present Illness:  [name] [name] is a 85 y.o. male who has been followed in this office for memory eval. He was initially scheduled for face-to-face office follow up visit today time but due to [name] [name], visit rescheduled for non-face-to-face telephone visit with patients consent. Unable to participate in video visit due to lack of access to device with camera.     [name] [name] is an 85 year old male with a history of memory disturbance.  He reports that his memory has remained stable.  His wife is also on the telephone giving her input.  She reports that he does require assistance with all ADLs.  She manages the finances.  He does not operate a motor vehicle.  She helps him with his medications.  He recently started Pravagen and has been taking it for 3 weeks.  Denies any changes in his mood or behavior.  Observations/Objective:   Neurological examination  Mentation: Alert.  speech and language fluent  Assessment and Plan:  1.  Memory disturbance  Overall the patient has remained stable.  We were unable to complete a memory test today due to the visit via telephone call.   Advised that he can continue on over-the-counter prevagen.  I have advised that if his symptoms worsen or he develops new symptoms he should let us know.  He will follow-up in 6 months for an office visit.  Follow Up Instructions:   Follow-up in 6 months    I discussed the assessment and treatment plan with the patient.  The patient was provided an opportunity to ask questions and all were answered to their satisfaction. The patient agreed with the plan and verbalized an understanding of the instructions.   I provided 15 minutes of non-face-to-face time during this encounter.     [name] Givens, MSN, NP-C 05/17/2019, 1:07 PM [facility] [address], [place], [place] [address] [phone]

## 2019-05-18 ENCOUNTER — Encounter: Payer: Self-pay | Admitting: Adult Health

## 2019-06-29 ENCOUNTER — Ambulatory Visit: Payer: Medicare Other | Admitting: Podiatry

## 2019-09-27 ENCOUNTER — Ambulatory Visit: Payer: Medicare Other | Admitting: Podiatry

## 2019-11-17 ENCOUNTER — Ambulatory Visit: Payer: Medicare PPO

## 2019-11-22 ENCOUNTER — Telehealth: Payer: Self-pay | Admitting: Adult Health

## 2019-11-22 NOTE — Telephone Encounter (Signed)
Can they do a video visit? If not we can do telephone

## 2019-11-22 NOTE — Telephone Encounter (Signed)
Called pt, spoke with pts wife (On DPR).  I asked if they can do a Video Visit.  She states that they cannot, they do not have access to a computer and they only have a flip phone.   They would prefer a Telephone visit.

## 2019-11-22 NOTE — Telephone Encounter (Signed)
Supak,Betty(wife) has called stating pt is physcially unable to come into the office on Thur, she would like to know if pt can have a telephone visit.  Phone rep was advised to message Aundra Millet, NP re: this request

## 2019-11-23 NOTE — Telephone Encounter (Signed)
That's fine

## 2019-11-24 ENCOUNTER — Ambulatory Visit (INDEPENDENT_AMBULATORY_CARE_PROVIDER_SITE_OTHER): Payer: Medicare PPO | Admitting: Adult Health

## 2019-11-24 ENCOUNTER — Encounter: Payer: Self-pay | Admitting: Adult Health

## 2019-11-24 DIAGNOSIS — F0280 Dementia in other diseases classified elsewhere without behavioral disturbance: Secondary | ICD-10-CM

## 2019-11-24 DIAGNOSIS — G301 Alzheimer's disease with late onset: Secondary | ICD-10-CM | POA: Diagnosis not present

## 2019-11-24 NOTE — Progress Notes (Signed)
  [facility] [address] [place]. [place] [address] [phone]     Virtual Visit via Telephone Note  I connected with [name] [name] on 11/24/19 at  3:00 PM EST by telephone located remotely at [facility] and verified that I am speaking with the correct person using two identifiers who reports being located at home   Visit scheduled by RN. She discussed the limitations, risks, security and privacy concerns of performing an evaluation and management service by telephone and the availability of in person appointments. I also discussed with the patient that there may be a patient responsible charge related to this service. The patient expressed understanding and agreed to proceed. See telephone note for consent and additional scheduling information.    History of Present Illness:  [name] [name] is a 86 y.o. male who has been followed in this office for memory disturbance.  He was initially scheduled for face-to-face office follow up visit today time but due to COVID19, visit rescheduled for non-face-to-face telephone visit with patients consent. Unable to participate in video visit due to lack of access to device with camera.    [name] [name] is an 86 year old male with a history of memory disturbance.  He returns today for follow-up.  He is currently on Pravagen.  Requires assistance with ADLs.  His wife manages the finances.  Reports some short-term memory loss.  Wife reports he has had some occasions where he may act out dreams.  This is very rare.  Wife  denies any new symptoms.    Observations/Objective:  Generalized: Well developed, in no acute distress   Neurological examination  Mentation: Alert oriented to time, place, history taking. Follows all commands speech and language fluent  Assessment and Plan:  1: Memory disturbance  We will continue to monitor symptoms.  Patient is currently taking over-the-counter preparation.   He can continue this.  Advised if symptoms worsen or he develops new symptoms they should let us know.   Follow Up Instructions:   F/U in 6 months    I discussed the assessment and treatment plan with the patient.  The patient was provided an opportunity to ask questions and all were answered to their satisfaction. The patient agreed with the plan and verbalized an understanding of the instructions.   I spent 15 minutes of face-to-face and non-face-to-face time with patient.  This included previsit chart review, lab review, study review, order entry, electronic health record documentation, patient education.      Butch Penny NP-C  [facility] [address] [place], [place] [address]  Phone [phone] Fax [phone] \

## 2020-01-02 ENCOUNTER — Telehealth: Payer: Self-pay | Admitting: Cardiology

## 2020-01-02 NOTE — Telephone Encounter (Signed)
Spoke with the patient's wife and informed her that I have made a note so it will be okay for her to come up early with her husband.

## 2020-01-02 NOTE — Telephone Encounter (Signed)
   Pt's wife called, they both have appt with Dr. Mayford Knife on 01/05/20. She said if they can come up at the same time and she needs to be with the pt during his appt since pt has dementia. Pt's wife also have appt with Dr. Mayford Knife on 01/05/20 at 2 pm.  Please advise

## 2020-01-05 ENCOUNTER — Ambulatory Visit: Payer: Medicare PPO | Admitting: Cardiology

## 2020-01-27 DIAGNOSIS — G4733 Obstructive sleep apnea (adult) (pediatric): Secondary | ICD-10-CM | POA: Diagnosis not present

## 2020-02-01 NOTE — Progress Notes (Signed)
Cardiology Consult Note    Date:  02/02/2020   ID:  [name] [name], DOB Jun 13, 1929, MRN [id number]  PCP:  [name], MD  Cardiologist:  Fransico Him, MD   Chief Complaint  Patient presents with  . New Patient (Initial Visit)    SOB    History of Present Illness:  [name] [name] is a 86 y.o. male who is being seen today for the evaluation of DOE at the request of [name], MD.  This is an 86yo male with a hx of CVA, Alzheimer's dementia, DM, HLD, OSA and is referred for SOB.  He recently was seen by his PCP and his wife complained that he was getting more SOB.  She tells me that his SOB has been occurring for over a year.  He has been very sedentary especially during COVID 19 and has not gotten out any.  His wife thinks that his SOB has gotten somewhat worse recently.  His wife is concerned that she notices his heart rate skipping when she takes his BP but he denies any palpitations.  He denies any chest pain or pressure but does have problems with weakness.  He denies any dizziness or syncope, PND or orthopnea.  He sleeps with a CPAP.    Past Medical History:  Diagnosis Date  . Acute CVA (cerebrovascular accident) ([place]) 04/05/2017  . Acute metabolic encephalopathy 6/96/7893  . Anxiety   . CAP (community acquired pneumonia) 04/04/2017  . Change in bowel habits 11/06/2016  . Dementia ([place])   . Diabetes mellitus   . Diabetes mellitus ([place]) 04/04/2017  . Essential tremor 08/20/2015  . Hyperlipidemia   . Late onset Alzheimer's disease without behavioral disturbance ([place]) 10/13/2018  . MCI (mild cognitive impairment) with memory loss 08/20/2015  . Memory loss 02/13/2015  . Obesity   . OBSTRUCTIVE SLEEP APNEA 07/19/2008   Qualifier: Diagnosis of  By: [name] LPN, [name]    . Osteoarthritis of knee 10/01/2017  . PNA (pneumonia)   . Sleep apnea    uses CPAP at night to sleep  . 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 Stroke (cerebrum) ([place])   . Stroke ([place])   . Tremor,  essential 10/13/2018  . Umbilical hernia without obstruction and without gangrene 11/06/2016  . Vision loss, central, left 10/13/2018    Past Surgical History:  Procedure Laterality Date  . cipap    . HERNIA REPAIR Right   . REPLACEMENT TOTAL KNEE Left    Dr Marissa Nestle / Dr Collie Siad  . TRANSURETHRAL RESECTION OF PROSTATE      Current Medications: Current Meds  Medication Sig  . ACCU-CHEK SMARTVIEW test strip USE ONCE A DAY AS DIRECTED  . aspirin EC 325 MG EC tablet Take 1 tablet (325 mg total) by mouth daily.  . B-D ULTRAFINE III SHORT PEN 31G X 8 MM MISC   . Cholecalciferol (VITAMIN D-3 PO) Take 1,000 Units by mouth daily.   . citalopram (CELEXA) 20 MG tablet TAKE 1 TABLET (20 MG TOTAL) BY MOUTH DAILY.  Marland Kitchen doxazosin (CARDURA) 4 MG tablet Take 4 mg by mouth daily.  . folic acid (FOLVITE) 810 MCG tablet Take 800 mcg by mouth daily.  Marland Kitchen guaiFENesin-dextromethorphan (ROBITUSSIN DM) 100-10 MG/5ML syrup Take 5 mLs by mouth every 4 (four) hours as needed for cough.  . insulin glargine (LANTUS SOLOSTAR) 100 UNIT/ML injection Inject 22 Units into the skin daily after supper.   . loratadine-pseudoephedrine (CLARITIN-D 24-HOUR) 10-240 MG per 24 hr tablet Take 1 tablet by  mouth daily as needed for allergies.  . metFORMIN (GLUCOPHAGE) 1000 MG tablet Take 1,000 mg by mouth 2 (two) times daily with a meal.  . methotrexate 2.5 MG tablet Take 12.5 mg by mouth See admin instructions. Every Thursday  . Multiple Vitamin (MULTIVITAMIN WITH MINERALS) TABS Take 1 tablet by mouth daily.  Marland Kitchen omeprazole (PRILOSEC) 40 MG capsule Take 40 mg by mouth daily.  . simvastatin (ZOCOR) 40 MG tablet Take 40 mg by mouth daily.    Allergies:   Donepezil, Lisinopril, Namenda [memantine hcl], and Robaxin [methocarbamol]   Social History   Socioeconomic History  . Marital status: Married    Spouse name: [name]  . Number of children: 2  . Years of education: 12th  . Highest education level: Not on file  Occupational History  .  Occupation: retired  Tobacco Use  . Smoking status: Never Smoker  . Smokeless tobacco: Never Used  Substance and Sexual Activity  . Alcohol use: No  . Drug use: No  . Sexual activity: Not on file  Other Topics Concern  . Not on file  Social History Narrative   Patient lives at home and drinks caffinated  Drinks daily. Married, 2 kids.  Caffeine 2-3 cups daily.    Hs Graduate.  Retired.     Social Determinants of Health   Financial Resource Strain:   . Difficulty of Paying Living Expenses:   Food Insecurity:   . Worried About Programme researcher, broadcasting/film/video in the Last Year:   . Barista in the Last Year:   Transportation Needs:   . Freight forwarder (Medical):   Marland Kitchen Lack of Transportation (Non-Medical):   Physical Activity:   . Days of Exercise per Week:   . Minutes of Exercise per Session:   Stress:   . Feeling of Stress :   Social Connections:   . Frequency of Communication with Friends and Family:   . Frequency of Social Gatherings with Friends and Family:   . Attends Religious Services:   . Active Member of Clubs or Organizations:   . Attends Banker Meetings:   Marland Kitchen Marital Status:      Family History:  The patient's family history includes Cancer - Lung in his father; Liver disease in his mother.   ROS:   Please see the history of present illness.    ROS All other systems reviewed and are negative.  No flowsheet data found.     PHYSICAL EXAM:   VS:  BP 112/64   Pulse 65   Ht 5' 9.5" (1.765 m)   Wt 186 lb (84.4 kg)   BMI 27.07 kg/m    GEN: Well nourished, well developed, in no acute distress  HEENT: normal  Neck: no JVD, carotid bruits, or masses Cardiac: RRR; no murmurs, rubs, or gallops.  Trace LE edema.  Intact distal pulses bilaterally.  Respiratory:  clear to auscultation bilaterally, normal work of breathing GI: soft, nontender, nondistended, + BS MS: no deformity or atrophy  Skin: warm and dry, no rash Neuro:  Alert and Oriented x 3,  Strength and sensation are intact Psych: euthymic mood, full affect  Wt Readings from Last 3 Encounters:  02/02/20 186 lb (84.4 kg)  10/13/18 211 lb (95.7 kg)  12/22/17 212 lb (96.2 kg)      Studies/Labs Reviewed:   EKG:  EKG is ordered today.  The ekg ordered today demonstrates NSR with PACs and RBBB  Recent Labs: No results found for  requested labs within last 8760 hours.   Lipid Panel    Component Value Date/Time   CHOL 128 04/05/2017 0551   TRIG 99 04/05/2017 0551   HDL 51 04/05/2017 0551   CHOLHDL 2.5 04/05/2017 0551   VLDL 20 04/05/2017 0551   LDLCALC 57 04/05/2017 0551    Additional studies/ records that were reviewed today include:  OV notes from PCP    ASSESSMENT:    1. DOE (dyspnea on exertion)   2. Alzheimer's dementia without behavioral disturbance, unspecified timing of dementia onset (HCC)   3. DM type 2, goal HbA1c < 7% (HCC)      PLAN:  In order of problems listed above:  1. DOE -? Etiology LV dysfunction vs. Coronary ischemia -check 2D echo -given his advanced dementia and dementia, would not recommend any invasive workup for ischemia and wife is in agreement -check TSH, BNP, ddimer -Hbg was stable at 11.6  2.  Alzheimer's dementia -this is advanced -he is followed by PCP and Neuro  3.  DM type 2 -followed by PCP -Continue Metformin and Insulin  4.  RBBB -this appears new -again would not pursue stress testing at this time - patient and wife do not want any invasive testing done so would not pursue nuclear stress testing especially since he has no CP;     Medication Adjustments/Labs and Tests Ordered: Current medicines are reviewed at length with the patient today.  Concerns regarding medicines are outlined above.  Medication changes, Labs and Tests ordered today are listed in the Patient Instructions below.  There are no Patient Instructions on file for this visit.   Signed, Armanda Magic, MD  02/02/2020 9:03 AM    [facility] [address] [place], [place], [place]  [address] Phone: [phone]; Fax: [phone]

## 2020-02-02 ENCOUNTER — Ambulatory Visit: Payer: Medicare PPO | Admitting: Cardiology

## 2020-02-02 ENCOUNTER — Other Ambulatory Visit: Payer: Self-pay

## 2020-02-02 ENCOUNTER — Encounter: Payer: Self-pay | Admitting: Cardiology

## 2020-02-02 VITALS — BP 112/64 | HR 65 | Ht 69.5 in | Wt 186.0 lb

## 2020-02-02 DIAGNOSIS — R0600 Dyspnea, unspecified: Secondary | ICD-10-CM | POA: Diagnosis not present

## 2020-02-02 DIAGNOSIS — F0280 Dementia in other diseases classified elsewhere without behavioral disturbance: Secondary | ICD-10-CM | POA: Diagnosis not present

## 2020-02-02 DIAGNOSIS — G309 Alzheimer's disease, unspecified: Secondary | ICD-10-CM | POA: Diagnosis not present

## 2020-02-02 DIAGNOSIS — E119 Type 2 diabetes mellitus without complications: Secondary | ICD-10-CM

## 2020-02-02 DIAGNOSIS — R0609 Other forms of dyspnea: Secondary | ICD-10-CM

## 2020-02-02 LAB — D-DIMER, QUANTITATIVE: D-DIMER: 1.03 mg/L FEU — ABNORMAL HIGH (ref 0.00–0.49)

## 2020-02-02 NOTE — Patient Instructions (Signed)
Medication Instructions:  Your physician recommends that you continue on your current medications as directed. Please refer to the Current Medication list given to you today.  *If you need a refill on your cardiac medications before your next appointment, please call your pharmacy*  Lab Work: TODAY: BNP, BMET, TSH, D-dimer If you have labs (blood work) drawn today and your tests are completely normal, you will receive your results only by: Marland Kitchen MyChart Message (if you have MyChart) OR . A paper copy in the mail If you have any lab test that is abnormal or we need to change your treatment, we will call you to review the results.   Testing/Procedures: Your physician has requested that you have an echocardiogram. Echocardiography is a painless test that uses sound waves to create images of your heart. It provides your doctor with information about the size and shape of your heart and how well your heart's chambers and valves are working. This procedure takes approximately one hour. There are no restrictions for this procedure.  Follow-Up: At [facility], you and your health needs are our priority.  As part of our continuing mission to provide you with exceptional heart care, we have created designated Provider Care Teams.  These Care Teams include your primary Cardiologist (physician) and Advanced Practice Providers (APPs -  Physician Assistants and Nurse Practitioners) who all work together to provide you with the care you need, when you need it.  We recommend signing up for the patient portal called "MyChart".  Sign up information is provided on this After Visit Summary.  MyChart is used to connect with patients for Virtual Visits (Telemedicine).  Patients are able to view lab/test results, encounter notes, upcoming appointments, etc.  Non-urgent messages can be sent to your provider as well.   To learn more about what you can do with MyChart, go to [email or web address].    Follow up with Dr.  Mayford Knife as needed based on results of testing.

## 2020-02-02 NOTE — Addendum Note (Signed)
Addended by: Theresia Majors on: 02/02/2020 09:26 AM   Modules accepted: Orders

## 2020-02-03 ENCOUNTER — Telehealth: Payer: Self-pay | Admitting: *Deleted

## 2020-02-03 ENCOUNTER — Ambulatory Visit (INDEPENDENT_AMBULATORY_CARE_PROVIDER_SITE_OTHER)
Admission: RE | Admit: 2020-02-03 | Discharge: 2020-02-03 | Disposition: A | Discharge status: Home / Self Care | Payer: Medicare PPO | Source: Ambulatory Visit | Attending: Cardiology | Admitting: Cardiology

## 2020-02-03 ENCOUNTER — Telehealth: Payer: Self-pay | Admitting: Cardiology

## 2020-02-03 DIAGNOSIS — R0602 Shortness of breath: Secondary | ICD-10-CM | POA: Diagnosis not present

## 2020-02-03 LAB — BASIC METABOLIC PANEL
BUN/Creatinine Ratio: 17 (ref 10–24)
BUN: 22 mg/dL (ref 8–27)
CO2: 21 mmol/L (ref 20–29)
Calcium: 9.2 mg/dL (ref 8.6–10.2)
Chloride: 102 mmol/L (ref 96–106)
Creatinine, Ser: 1.28 mg/dL — ABNORMAL HIGH (ref 0.76–1.27)
GFR calc Af Amer: 57 mL/min/{1.73_m2} — ABNORMAL LOW (ref >59)
GFR calc non Af Amer: 49 mL/min/{1.73_m2} — ABNORMAL LOW (ref >59)
Glucose: 99 mg/dL (ref 65–99)
Potassium: 4.9 mmol/L (ref 3.5–5.2)
Sodium: 139 mmol/L (ref 134–144)

## 2020-02-03 LAB — TSH: TSH: 2.13 u[IU]/mL (ref 0.450–4.500)

## 2020-02-03 LAB — PRO B NATRIURETIC PEPTIDE: NT-Pro BNP: 104 pg/mL (ref 0–486)

## 2020-02-03 MED ORDER — IOHEXOL 350 MG/ML SOLN
80.0000 mL | Freq: Once | INTRAVENOUS | Status: AC | PRN
  Administered 2020-02-03: 80 mL via INTRAVENOUS

## 2020-02-03 NOTE — Telephone Encounter (Signed)
I spoke with pt's wife and pt will be there for CT today.  He will be npo for 2 hours prior to CT

## 2020-02-03 NOTE — Telephone Encounter (Signed)
New Message     Pts wife is calling back to let Dr Malachy Mood nurse know the pt will do the CT scan   Please advise

## 2020-02-03 NOTE — Telephone Encounter (Signed)
I spoke with patient's wife and reviewed lab results with her.  CTA has been scheduled for 1:30 today at [facility] office. Wife is planning on patient being here for this appointment. She will call us back if she is unable to arrange transportation today

## 2020-02-03 NOTE — Telephone Encounter (Signed)
-----   Message from Loa Socks, LPN sent at 04/09/3663 10:05 AM EDT -----  ----- Message ----- From: Quintella Reichert, MD Sent: 02/02/2020  11:09 PM EDT To: [facility] Triage  Please get a chest CTA 5/21 to rule out PE given elevated DDimer and SOB

## 2020-02-03 NOTE — Telephone Encounter (Signed)
Pts wife is calling to let Dr. Mayford Knife know, that the pt will proceed with coming to his scheduled CT Angio of the Chest for today 5/21 at 1:45 pm. Informed the pts wife I will pass this message along to Dr. Mayford Knife, and we will see them as planned.  Wife verbalized understanding and agrees with this plan.

## 2020-02-07 ENCOUNTER — Telehealth: Payer: Self-pay | Admitting: Cardiology

## 2020-02-07 NOTE — Telephone Encounter (Signed)
Patient returning [name]'s call in regards to test results.

## 2020-02-07 NOTE — Telephone Encounter (Signed)
The patient's wife has been notified of the result and verbalized understanding.  All questions (if any) were answered. Theresia Majors, RN 02/07/2020 10:28 AM

## 2020-02-20 DIAGNOSIS — R0600 Dyspnea, unspecified: Secondary | ICD-10-CM | POA: Diagnosis not present

## 2020-02-20 DIAGNOSIS — G309 Alzheimer's disease, unspecified: Secondary | ICD-10-CM | POA: Diagnosis not present

## 2020-02-20 DIAGNOSIS — I129 Hypertensive chronic kidney disease with stage 1 through stage 4 chronic kidney disease, or unspecified chronic kidney disease: Secondary | ICD-10-CM | POA: Diagnosis not present

## 2020-02-20 DIAGNOSIS — D692 Other nonthrombocytopenic purpura: Secondary | ICD-10-CM | POA: Diagnosis not present

## 2020-02-20 DIAGNOSIS — R5381 Other malaise: Secondary | ICD-10-CM | POA: Diagnosis not present

## 2020-02-20 DIAGNOSIS — I6930 Unspecified sequelae of cerebral infarction: Secondary | ICD-10-CM | POA: Diagnosis not present

## 2020-02-27 DIAGNOSIS — G4733 Obstructive sleep apnea (adult) (pediatric): Secondary | ICD-10-CM | POA: Diagnosis not present

## 2020-02-28 ENCOUNTER — Ambulatory Visit (HOSPITAL_COMMUNITY): Payer: Medicare PPO | Attending: Cardiovascular Disease

## 2020-02-28 ENCOUNTER — Other Ambulatory Visit: Payer: Self-pay

## 2020-02-28 DIAGNOSIS — F0280 Dementia in other diseases classified elsewhere without behavioral disturbance: Secondary | ICD-10-CM

## 2020-02-28 DIAGNOSIS — G309 Alzheimer's disease, unspecified: Secondary | ICD-10-CM

## 2020-02-28 DIAGNOSIS — R0600 Dyspnea, unspecified: Secondary | ICD-10-CM | POA: Diagnosis not present

## 2020-02-28 DIAGNOSIS — R0609 Other forms of dyspnea: Secondary | ICD-10-CM

## 2020-02-28 DIAGNOSIS — E119 Type 2 diabetes mellitus without complications: Secondary | ICD-10-CM

## 2020-02-29 DIAGNOSIS — G4733 Obstructive sleep apnea (adult) (pediatric): Secondary | ICD-10-CM | POA: Diagnosis not present

## 2020-02-29 DIAGNOSIS — E1151 Type 2 diabetes mellitus with diabetic peripheral angiopathy without gangrene: Secondary | ICD-10-CM | POA: Diagnosis not present

## 2020-02-29 DIAGNOSIS — N183 Chronic kidney disease, stage 3 unspecified: Secondary | ICD-10-CM | POA: Diagnosis not present

## 2020-02-29 DIAGNOSIS — F0280 Dementia in other diseases classified elsewhere without behavioral disturbance: Secondary | ICD-10-CM | POA: Diagnosis not present

## 2020-02-29 DIAGNOSIS — I129 Hypertensive chronic kidney disease with stage 1 through stage 4 chronic kidney disease, or unspecified chronic kidney disease: Secondary | ICD-10-CM | POA: Diagnosis not present

## 2020-02-29 DIAGNOSIS — I872 Venous insufficiency (chronic) (peripheral): Secondary | ICD-10-CM | POA: Diagnosis not present

## 2020-02-29 DIAGNOSIS — G309 Alzheimer's disease, unspecified: Secondary | ICD-10-CM | POA: Diagnosis not present

## 2020-02-29 DIAGNOSIS — E1122 Type 2 diabetes mellitus with diabetic chronic kidney disease: Secondary | ICD-10-CM | POA: Diagnosis not present

## 2020-02-29 DIAGNOSIS — D631 Anemia in chronic kidney disease: Secondary | ICD-10-CM | POA: Diagnosis not present

## 2020-02-29 DIAGNOSIS — M1990 Unspecified osteoarthritis, unspecified site: Secondary | ICD-10-CM | POA: Diagnosis not present

## 2020-03-01 ENCOUNTER — Other Ambulatory Visit (HOSPITAL_COMMUNITY): Payer: Medicare PPO

## 2020-03-02 DIAGNOSIS — G309 Alzheimer's disease, unspecified: Secondary | ICD-10-CM | POA: Diagnosis not present

## 2020-03-02 DIAGNOSIS — E1151 Type 2 diabetes mellitus with diabetic peripheral angiopathy without gangrene: Secondary | ICD-10-CM | POA: Diagnosis not present

## 2020-03-02 DIAGNOSIS — F0280 Dementia in other diseases classified elsewhere without behavioral disturbance: Secondary | ICD-10-CM | POA: Diagnosis not present

## 2020-03-02 DIAGNOSIS — I872 Venous insufficiency (chronic) (peripheral): Secondary | ICD-10-CM | POA: Diagnosis not present

## 2020-03-02 DIAGNOSIS — E1122 Type 2 diabetes mellitus with diabetic chronic kidney disease: Secondary | ICD-10-CM | POA: Diagnosis not present

## 2020-03-02 DIAGNOSIS — D631 Anemia in chronic kidney disease: Secondary | ICD-10-CM | POA: Diagnosis not present

## 2020-03-02 DIAGNOSIS — N183 Chronic kidney disease, stage 3 unspecified: Secondary | ICD-10-CM | POA: Diagnosis not present

## 2020-03-02 DIAGNOSIS — M1990 Unspecified osteoarthritis, unspecified site: Secondary | ICD-10-CM | POA: Diagnosis not present

## 2020-03-02 DIAGNOSIS — I129 Hypertensive chronic kidney disease with stage 1 through stage 4 chronic kidney disease, or unspecified chronic kidney disease: Secondary | ICD-10-CM | POA: Diagnosis not present

## 2020-03-13 DIAGNOSIS — G309 Alzheimer's disease, unspecified: Secondary | ICD-10-CM | POA: Diagnosis not present

## 2020-03-13 DIAGNOSIS — I872 Venous insufficiency (chronic) (peripheral): Secondary | ICD-10-CM | POA: Diagnosis not present

## 2020-03-13 DIAGNOSIS — I129 Hypertensive chronic kidney disease with stage 1 through stage 4 chronic kidney disease, or unspecified chronic kidney disease: Secondary | ICD-10-CM | POA: Diagnosis not present

## 2020-03-13 DIAGNOSIS — F0280 Dementia in other diseases classified elsewhere without behavioral disturbance: Secondary | ICD-10-CM | POA: Diagnosis not present

## 2020-03-13 DIAGNOSIS — E1122 Type 2 diabetes mellitus with diabetic chronic kidney disease: Secondary | ICD-10-CM | POA: Diagnosis not present

## 2020-03-13 DIAGNOSIS — D631 Anemia in chronic kidney disease: Secondary | ICD-10-CM | POA: Diagnosis not present

## 2020-03-13 DIAGNOSIS — M1990 Unspecified osteoarthritis, unspecified site: Secondary | ICD-10-CM | POA: Diagnosis not present

## 2020-03-13 DIAGNOSIS — E1151 Type 2 diabetes mellitus with diabetic peripheral angiopathy without gangrene: Secondary | ICD-10-CM | POA: Diagnosis not present

## 2020-03-13 DIAGNOSIS — N183 Chronic kidney disease, stage 3 unspecified: Secondary | ICD-10-CM | POA: Diagnosis not present

## 2020-03-15 DIAGNOSIS — I129 Hypertensive chronic kidney disease with stage 1 through stage 4 chronic kidney disease, or unspecified chronic kidney disease: Secondary | ICD-10-CM | POA: Diagnosis not present

## 2020-03-15 DIAGNOSIS — F0280 Dementia in other diseases classified elsewhere without behavioral disturbance: Secondary | ICD-10-CM | POA: Diagnosis not present

## 2020-03-15 DIAGNOSIS — E1151 Type 2 diabetes mellitus with diabetic peripheral angiopathy without gangrene: Secondary | ICD-10-CM | POA: Diagnosis not present

## 2020-03-15 DIAGNOSIS — D631 Anemia in chronic kidney disease: Secondary | ICD-10-CM | POA: Diagnosis not present

## 2020-03-15 DIAGNOSIS — E1122 Type 2 diabetes mellitus with diabetic chronic kidney disease: Secondary | ICD-10-CM | POA: Diagnosis not present

## 2020-03-15 DIAGNOSIS — I872 Venous insufficiency (chronic) (peripheral): Secondary | ICD-10-CM | POA: Diagnosis not present

## 2020-03-15 DIAGNOSIS — N183 Chronic kidney disease, stage 3 unspecified: Secondary | ICD-10-CM | POA: Diagnosis not present

## 2020-03-15 DIAGNOSIS — M1990 Unspecified osteoarthritis, unspecified site: Secondary | ICD-10-CM | POA: Diagnosis not present

## 2020-03-15 DIAGNOSIS — G309 Alzheimer's disease, unspecified: Secondary | ICD-10-CM | POA: Diagnosis not present

## 2020-03-19 DIAGNOSIS — F0280 Dementia in other diseases classified elsewhere without behavioral disturbance: Secondary | ICD-10-CM | POA: Diagnosis not present

## 2020-03-19 DIAGNOSIS — I129 Hypertensive chronic kidney disease with stage 1 through stage 4 chronic kidney disease, or unspecified chronic kidney disease: Secondary | ICD-10-CM | POA: Diagnosis not present

## 2020-03-19 DIAGNOSIS — I872 Venous insufficiency (chronic) (peripheral): Secondary | ICD-10-CM | POA: Diagnosis not present

## 2020-03-19 DIAGNOSIS — E1151 Type 2 diabetes mellitus with diabetic peripheral angiopathy without gangrene: Secondary | ICD-10-CM | POA: Diagnosis not present

## 2020-03-19 DIAGNOSIS — M1990 Unspecified osteoarthritis, unspecified site: Secondary | ICD-10-CM | POA: Diagnosis not present

## 2020-03-19 DIAGNOSIS — E1122 Type 2 diabetes mellitus with diabetic chronic kidney disease: Secondary | ICD-10-CM | POA: Diagnosis not present

## 2020-03-19 DIAGNOSIS — N183 Chronic kidney disease, stage 3 unspecified: Secondary | ICD-10-CM | POA: Diagnosis not present

## 2020-03-19 DIAGNOSIS — D631 Anemia in chronic kidney disease: Secondary | ICD-10-CM | POA: Diagnosis not present

## 2020-03-19 DIAGNOSIS — G309 Alzheimer's disease, unspecified: Secondary | ICD-10-CM | POA: Diagnosis not present

## 2020-03-21 ENCOUNTER — Other Ambulatory Visit: Payer: Self-pay

## 2020-03-21 ENCOUNTER — Ambulatory Visit: Payer: Medicare PPO | Admitting: Podiatry

## 2020-03-21 ENCOUNTER — Encounter: Payer: Self-pay | Admitting: Podiatry

## 2020-03-21 DIAGNOSIS — M79674 Pain in right toe(s): Secondary | ICD-10-CM

## 2020-03-21 DIAGNOSIS — E119 Type 2 diabetes mellitus without complications: Secondary | ICD-10-CM | POA: Diagnosis not present

## 2020-03-21 DIAGNOSIS — Z794 Long term (current) use of insulin: Secondary | ICD-10-CM | POA: Diagnosis not present

## 2020-03-21 DIAGNOSIS — L84 Corns and callosities: Secondary | ICD-10-CM

## 2020-03-21 DIAGNOSIS — M79675 Pain in left toe(s): Secondary | ICD-10-CM

## 2020-03-21 DIAGNOSIS — B351 Tinea unguium: Secondary | ICD-10-CM

## 2020-03-21 NOTE — Patient Instructions (Signed)

## 2020-03-21 NOTE — Progress Notes (Signed)
Subjective: [name] [name] presents today preventative diabetic foot care and painful callus(es) left foot and painful thick toenails that are difficult to trim. Pain interferes with ambulation. Aggravating factors include wearing enclosed shoe gear. Pain is relieved with periodic professional debridement.  [name] Joe, MD is patient's PCP. Last visit was April, 2021, per wife's recall.   Wife states they have missed last appointment due to COVID-19 precautions.   Past Medical History:  Diagnosis Date  . Acute CVA (cerebrovascular accident) (HCC) 04/05/2017  . Acute metabolic encephalopathy 04/04/2017  . Anxiety   . CAP (community acquired pneumonia) 04/04/2017  . Change in bowel habits 11/06/2016  . Dementia (HCC)   . Diabetes mellitus   . Diabetes mellitus (HCC) 04/04/2017  . Essential tremor 08/20/2015  . Hyperlipidemia   . Late onset Alzheimer's disease without behavioral disturbance (HCC) 10/13/2018  . MCI (mild cognitive impairment) with memory loss 08/20/2015  . Memory loss 02/13/2015  . Obesity   . OBSTRUCTIVE SLEEP APNEA 07/19/2008   Qualifier: Diagnosis of  By: Thad Ranger LPN, [name]    . Osteoarthritis of knee 10/01/2017  . PNA (pneumonia)   . Sleep apnea    uses CPAP at night to sleep  . 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 Stroke (cerebrum) (HCC)   . Stroke (HCC)   . Tremor, essential 10/13/2018  . Umbilical hernia without obstruction and without gangrene 11/06/2016  . Vision loss, central, left 10/13/2018     Patient Active Problem List   Diagnosis Date Noted  . Vision loss, central, left 10/13/2018  . Tremor, essential 10/13/2018  . Late onset Alzheimer's disease without behavioral disturbance (HCC) 10/13/2018  . Osteoarthritis of knee 10/01/2017  . Stroke (cerebrum) (HCC)   . Acute CVA (cerebrovascular accident) (HCC) 04/05/2017  . CAP (community acquired pneumonia) 04/04/2017  . Acute metabolic encephalopathy 04/04/2017  . Dementia (HCC) 04/04/2017  . Diabetes  mellitus (HCC) 04/04/2017  . Hyperlipidemia 04/04/2017  . Change in bowel habits 11/06/2016  . Umbilical hernia without obstruction and without gangrene 11/06/2016  . 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 Essential tremor 08/20/2015  . MCI (mild cognitive impairment) with memory loss 08/20/2015  . Memory loss 02/13/2015  . OBSTRUCTIVE SLEEP APNEA 07/19/2008    Current Outpatient Medications on File Prior to Visit  Medication Sig Dispense Refill  . ACCU-CHEK SMARTVIEW test strip USE ONCE A DAY AS DIRECTED  1  . aspirin EC 325 MG EC tablet Take 1 tablet (325 mg total) by mouth daily. 30 tablet 4  . B-D ULTRAFINE III SHORT PEN 31G X 8 MM MISC   1  . Cholecalciferol (VITAMIN D-3 PO) Take 1,000 Units by mouth daily.     . citalopram (CELEXA) 20 MG tablet TAKE 1 TABLET (20 MG TOTAL) BY MOUTH DAILY. 90 tablet 3  . doxazosin (CARDURA) 4 MG tablet Take 4 mg by mouth daily.    . folic acid (FOLVITE) 800 MCG tablet Take 800 mcg by mouth daily.    Marland Kitchen guaiFENesin-dextromethorphan (ROBITUSSIN DM) 100-10 MG/5ML syrup Take 5 mLs by mouth every 4 (four) hours as needed for cough. 118 mL 0  . insulin glargine (LANTUS SOLOSTAR) 100 UNIT/ML injection Inject 22 Units into the skin daily after supper.     . loratadine-pseudoephedrine (CLARITIN-D 24-HOUR) 10-240 MG per 24 hr tablet Take 1 tablet by mouth daily as needed for allergies.    . metFORMIN (GLUCOPHAGE) 1000 MG tablet Take 1,000 mg by mouth 2 (two) times daily with a meal.    . methotrexate  2.5 MG tablet Take 12.5 mg by mouth See admin instructions. Every Thursday    . Multiple Vitamin (MULTIVITAMIN WITH MINERALS) TABS Take 1 tablet by mouth daily.    Marland Kitchen omeprazole (PRILOSEC) 40 MG capsule Take 40 mg by mouth daily.  0  . simvastatin (ZOCOR) 40 MG tablet Take 40 mg by mouth daily.  0   No current facility-administered medications on file prior to visit.     Allergies  Allergen Reactions  . Donepezil Shortness Of Breath and Swelling  . Lisinopril  Anaphylaxis, Shortness Of Breath and Swelling    HAD TO COME TO THE E.D. AFTER TAKING THIS!!  . [name] Hcl] Shortness Of Breath and Swelling  . Robaxin [Methocarbamol] Anaphylaxis, Shortness Of Breath and Swelling    HAD TO COME TO THE E.D. AFTER TAKING THIS!!    Objective: [name] [name] is a pleasant 86 y.o. Caucasian male obese in NAD. AAO x 3.  There were no vitals filed for this visit.  Vascular Examination: Neurovascular status unchanged b/l lower extremities. Capillary refill time to digits immediate b/l. Palpable pedal pulses b/l LE. Pedal hair absent. Lower extremity skin temperature gradient within normal limits. No pain with calf compression b/l. Trace edema noted b/l lower extremities.  Dermatological Examination: Pedal skin with normal turgor, texture and tone bilaterally. No open wounds bilaterally. No interdigital macerations bilaterally. Toenails 1-5 b/l elongated, discolored, dystrophic, thickened, crumbly with subungual debris and tenderness to dorsal palpation. Hyperkeratotic lesion(s) 1st metatarsal head left foot.  No erythema, no edema, no drainage, no flocculence.  Musculoskeletal: Normal muscle strength 5/5 to all lower extremity muscle groups bilaterally. No pain crepitus or joint limitation noted with ROM b/l. No gross bony deformities bilaterally. Utilizes cane for ambulation assistance.  Neurological Examination: Protective sensation intact 5/5 intact bilaterally with 10g monofilament b/l. Vibratory sensation intact b/l. Clonus negative b/l.  Assessment: 1. Pain due to onychomycosis of toenails of both feet   2. Callus   3. Type 2 diabetes mellitus without complication, with long-term current use of insulin (HCC)    Plan: -Examined patient. -Continue diabetic foot care principles. -Toenails 1-5 b/l were debrided in length and girth with sterile nail nippers and dremel without iatrogenic bleeding.  -Callus(es) 1st metatarsal head left  foot pared utilizing sterile scalpel blade without complication or incident. Total number debrided =1. -Patient to report any pedal injuries to medical professional immediately. -Patient to continue soft, supportive shoe gear daily. -Patient/POA to call should there be question/concern in the interim.  Return in about 3 months (around 06/21/2020) for nail trim.  Freddie Breech, DPM

## 2020-03-23 DIAGNOSIS — E1122 Type 2 diabetes mellitus with diabetic chronic kidney disease: Secondary | ICD-10-CM | POA: Diagnosis not present

## 2020-03-23 DIAGNOSIS — I872 Venous insufficiency (chronic) (peripheral): Secondary | ICD-10-CM | POA: Diagnosis not present

## 2020-03-23 DIAGNOSIS — E1151 Type 2 diabetes mellitus with diabetic peripheral angiopathy without gangrene: Secondary | ICD-10-CM | POA: Diagnosis not present

## 2020-03-23 DIAGNOSIS — G309 Alzheimer's disease, unspecified: Secondary | ICD-10-CM | POA: Diagnosis not present

## 2020-03-23 DIAGNOSIS — I129 Hypertensive chronic kidney disease with stage 1 through stage 4 chronic kidney disease, or unspecified chronic kidney disease: Secondary | ICD-10-CM | POA: Diagnosis not present

## 2020-03-23 DIAGNOSIS — M1990 Unspecified osteoarthritis, unspecified site: Secondary | ICD-10-CM | POA: Diagnosis not present

## 2020-03-23 DIAGNOSIS — N183 Chronic kidney disease, stage 3 unspecified: Secondary | ICD-10-CM | POA: Diagnosis not present

## 2020-03-23 DIAGNOSIS — D631 Anemia in chronic kidney disease: Secondary | ICD-10-CM | POA: Diagnosis not present

## 2020-03-23 DIAGNOSIS — F0280 Dementia in other diseases classified elsewhere without behavioral disturbance: Secondary | ICD-10-CM | POA: Diagnosis not present

## 2020-03-27 DIAGNOSIS — E1151 Type 2 diabetes mellitus with diabetic peripheral angiopathy without gangrene: Secondary | ICD-10-CM | POA: Diagnosis not present

## 2020-03-27 DIAGNOSIS — I129 Hypertensive chronic kidney disease with stage 1 through stage 4 chronic kidney disease, or unspecified chronic kidney disease: Secondary | ICD-10-CM | POA: Diagnosis not present

## 2020-03-27 DIAGNOSIS — E1122 Type 2 diabetes mellitus with diabetic chronic kidney disease: Secondary | ICD-10-CM | POA: Diagnosis not present

## 2020-03-27 DIAGNOSIS — D631 Anemia in chronic kidney disease: Secondary | ICD-10-CM | POA: Diagnosis not present

## 2020-03-27 DIAGNOSIS — I872 Venous insufficiency (chronic) (peripheral): Secondary | ICD-10-CM | POA: Diagnosis not present

## 2020-03-27 DIAGNOSIS — F0280 Dementia in other diseases classified elsewhere without behavioral disturbance: Secondary | ICD-10-CM | POA: Diagnosis not present

## 2020-03-27 DIAGNOSIS — G309 Alzheimer's disease, unspecified: Secondary | ICD-10-CM | POA: Diagnosis not present

## 2020-03-27 DIAGNOSIS — N183 Chronic kidney disease, stage 3 unspecified: Secondary | ICD-10-CM | POA: Diagnosis not present

## 2020-03-27 DIAGNOSIS — M1990 Unspecified osteoarthritis, unspecified site: Secondary | ICD-10-CM | POA: Diagnosis not present

## 2020-03-28 DIAGNOSIS — G4733 Obstructive sleep apnea (adult) (pediatric): Secondary | ICD-10-CM | POA: Diagnosis not present

## 2020-03-28 DIAGNOSIS — G309 Alzheimer's disease, unspecified: Secondary | ICD-10-CM | POA: Diagnosis not present

## 2020-03-28 DIAGNOSIS — E1122 Type 2 diabetes mellitus with diabetic chronic kidney disease: Secondary | ICD-10-CM | POA: Diagnosis not present

## 2020-03-28 DIAGNOSIS — F0280 Dementia in other diseases classified elsewhere without behavioral disturbance: Secondary | ICD-10-CM | POA: Diagnosis not present

## 2020-03-28 DIAGNOSIS — E1151 Type 2 diabetes mellitus with diabetic peripheral angiopathy without gangrene: Secondary | ICD-10-CM | POA: Diagnosis not present

## 2020-03-28 DIAGNOSIS — N183 Chronic kidney disease, stage 3 unspecified: Secondary | ICD-10-CM | POA: Diagnosis not present

## 2020-03-28 DIAGNOSIS — D631 Anemia in chronic kidney disease: Secondary | ICD-10-CM | POA: Diagnosis not present

## 2020-03-28 DIAGNOSIS — I872 Venous insufficiency (chronic) (peripheral): Secondary | ICD-10-CM | POA: Diagnosis not present

## 2020-03-28 DIAGNOSIS — M1990 Unspecified osteoarthritis, unspecified site: Secondary | ICD-10-CM | POA: Diagnosis not present

## 2020-03-28 DIAGNOSIS — I129 Hypertensive chronic kidney disease with stage 1 through stage 4 chronic kidney disease, or unspecified chronic kidney disease: Secondary | ICD-10-CM | POA: Diagnosis not present

## 2020-04-03 DIAGNOSIS — E1151 Type 2 diabetes mellitus with diabetic peripheral angiopathy without gangrene: Secondary | ICD-10-CM | POA: Diagnosis not present

## 2020-04-03 DIAGNOSIS — D631 Anemia in chronic kidney disease: Secondary | ICD-10-CM | POA: Diagnosis not present

## 2020-04-03 DIAGNOSIS — E1122 Type 2 diabetes mellitus with diabetic chronic kidney disease: Secondary | ICD-10-CM | POA: Diagnosis not present

## 2020-04-03 DIAGNOSIS — I872 Venous insufficiency (chronic) (peripheral): Secondary | ICD-10-CM | POA: Diagnosis not present

## 2020-04-03 DIAGNOSIS — M1990 Unspecified osteoarthritis, unspecified site: Secondary | ICD-10-CM | POA: Diagnosis not present

## 2020-04-03 DIAGNOSIS — G309 Alzheimer's disease, unspecified: Secondary | ICD-10-CM | POA: Diagnosis not present

## 2020-04-03 DIAGNOSIS — I129 Hypertensive chronic kidney disease with stage 1 through stage 4 chronic kidney disease, or unspecified chronic kidney disease: Secondary | ICD-10-CM | POA: Diagnosis not present

## 2020-04-03 DIAGNOSIS — F0280 Dementia in other diseases classified elsewhere without behavioral disturbance: Secondary | ICD-10-CM | POA: Diagnosis not present

## 2020-04-03 DIAGNOSIS — N183 Chronic kidney disease, stage 3 unspecified: Secondary | ICD-10-CM | POA: Diagnosis not present

## 2020-04-04 DIAGNOSIS — G309 Alzheimer's disease, unspecified: Secondary | ICD-10-CM | POA: Diagnosis not present

## 2020-04-04 DIAGNOSIS — D631 Anemia in chronic kidney disease: Secondary | ICD-10-CM | POA: Diagnosis not present

## 2020-04-04 DIAGNOSIS — E1122 Type 2 diabetes mellitus with diabetic chronic kidney disease: Secondary | ICD-10-CM | POA: Diagnosis not present

## 2020-04-04 DIAGNOSIS — I872 Venous insufficiency (chronic) (peripheral): Secondary | ICD-10-CM | POA: Diagnosis not present

## 2020-04-04 DIAGNOSIS — I129 Hypertensive chronic kidney disease with stage 1 through stage 4 chronic kidney disease, or unspecified chronic kidney disease: Secondary | ICD-10-CM | POA: Diagnosis not present

## 2020-04-04 DIAGNOSIS — M1990 Unspecified osteoarthritis, unspecified site: Secondary | ICD-10-CM | POA: Diagnosis not present

## 2020-04-04 DIAGNOSIS — E1151 Type 2 diabetes mellitus with diabetic peripheral angiopathy without gangrene: Secondary | ICD-10-CM | POA: Diagnosis not present

## 2020-04-04 DIAGNOSIS — N183 Chronic kidney disease, stage 3 unspecified: Secondary | ICD-10-CM | POA: Diagnosis not present

## 2020-04-04 DIAGNOSIS — F0280 Dementia in other diseases classified elsewhere without behavioral disturbance: Secondary | ICD-10-CM | POA: Diagnosis not present

## 2020-04-06 DIAGNOSIS — I872 Venous insufficiency (chronic) (peripheral): Secondary | ICD-10-CM | POA: Diagnosis not present

## 2020-04-06 DIAGNOSIS — I129 Hypertensive chronic kidney disease with stage 1 through stage 4 chronic kidney disease, or unspecified chronic kidney disease: Secondary | ICD-10-CM | POA: Diagnosis not present

## 2020-04-06 DIAGNOSIS — N183 Chronic kidney disease, stage 3 unspecified: Secondary | ICD-10-CM | POA: Diagnosis not present

## 2020-04-06 DIAGNOSIS — E1122 Type 2 diabetes mellitus with diabetic chronic kidney disease: Secondary | ICD-10-CM | POA: Diagnosis not present

## 2020-04-06 DIAGNOSIS — G309 Alzheimer's disease, unspecified: Secondary | ICD-10-CM | POA: Diagnosis not present

## 2020-04-06 DIAGNOSIS — F0280 Dementia in other diseases classified elsewhere without behavioral disturbance: Secondary | ICD-10-CM | POA: Diagnosis not present

## 2020-04-06 DIAGNOSIS — M1990 Unspecified osteoarthritis, unspecified site: Secondary | ICD-10-CM | POA: Diagnosis not present

## 2020-04-06 DIAGNOSIS — E1151 Type 2 diabetes mellitus with diabetic peripheral angiopathy without gangrene: Secondary | ICD-10-CM | POA: Diagnosis not present

## 2020-04-06 DIAGNOSIS — D631 Anemia in chronic kidney disease: Secondary | ICD-10-CM | POA: Diagnosis not present

## 2020-04-10 DIAGNOSIS — E1122 Type 2 diabetes mellitus with diabetic chronic kidney disease: Secondary | ICD-10-CM | POA: Diagnosis not present

## 2020-04-10 DIAGNOSIS — M1990 Unspecified osteoarthritis, unspecified site: Secondary | ICD-10-CM | POA: Diagnosis not present

## 2020-04-10 DIAGNOSIS — E1151 Type 2 diabetes mellitus with diabetic peripheral angiopathy without gangrene: Secondary | ICD-10-CM | POA: Diagnosis not present

## 2020-04-10 DIAGNOSIS — G309 Alzheimer's disease, unspecified: Secondary | ICD-10-CM | POA: Diagnosis not present

## 2020-04-10 DIAGNOSIS — I872 Venous insufficiency (chronic) (peripheral): Secondary | ICD-10-CM | POA: Diagnosis not present

## 2020-04-10 DIAGNOSIS — F0280 Dementia in other diseases classified elsewhere without behavioral disturbance: Secondary | ICD-10-CM | POA: Diagnosis not present

## 2020-04-10 DIAGNOSIS — D631 Anemia in chronic kidney disease: Secondary | ICD-10-CM | POA: Diagnosis not present

## 2020-04-10 DIAGNOSIS — N183 Chronic kidney disease, stage 3 unspecified: Secondary | ICD-10-CM | POA: Diagnosis not present

## 2020-04-10 DIAGNOSIS — I129 Hypertensive chronic kidney disease with stage 1 through stage 4 chronic kidney disease, or unspecified chronic kidney disease: Secondary | ICD-10-CM | POA: Diagnosis not present

## 2020-04-11 DIAGNOSIS — E1122 Type 2 diabetes mellitus with diabetic chronic kidney disease: Secondary | ICD-10-CM | POA: Diagnosis not present

## 2020-04-11 DIAGNOSIS — N183 Chronic kidney disease, stage 3 unspecified: Secondary | ICD-10-CM | POA: Diagnosis not present

## 2020-04-11 DIAGNOSIS — I129 Hypertensive chronic kidney disease with stage 1 through stage 4 chronic kidney disease, or unspecified chronic kidney disease: Secondary | ICD-10-CM | POA: Diagnosis not present

## 2020-04-11 DIAGNOSIS — I872 Venous insufficiency (chronic) (peripheral): Secondary | ICD-10-CM | POA: Diagnosis not present

## 2020-04-11 DIAGNOSIS — E1151 Type 2 diabetes mellitus with diabetic peripheral angiopathy without gangrene: Secondary | ICD-10-CM | POA: Diagnosis not present

## 2020-04-11 DIAGNOSIS — M1990 Unspecified osteoarthritis, unspecified site: Secondary | ICD-10-CM | POA: Diagnosis not present

## 2020-04-11 DIAGNOSIS — F0280 Dementia in other diseases classified elsewhere without behavioral disturbance: Secondary | ICD-10-CM | POA: Diagnosis not present

## 2020-04-11 DIAGNOSIS — D631 Anemia in chronic kidney disease: Secondary | ICD-10-CM | POA: Diagnosis not present

## 2020-04-11 DIAGNOSIS — G309 Alzheimer's disease, unspecified: Secondary | ICD-10-CM | POA: Diagnosis not present

## 2020-04-12 DIAGNOSIS — E1122 Type 2 diabetes mellitus with diabetic chronic kidney disease: Secondary | ICD-10-CM | POA: Diagnosis not present

## 2020-04-12 DIAGNOSIS — D631 Anemia in chronic kidney disease: Secondary | ICD-10-CM | POA: Diagnosis not present

## 2020-04-12 DIAGNOSIS — F0280 Dementia in other diseases classified elsewhere without behavioral disturbance: Secondary | ICD-10-CM | POA: Diagnosis not present

## 2020-04-12 DIAGNOSIS — G309 Alzheimer's disease, unspecified: Secondary | ICD-10-CM | POA: Diagnosis not present

## 2020-04-12 DIAGNOSIS — N183 Chronic kidney disease, stage 3 unspecified: Secondary | ICD-10-CM | POA: Diagnosis not present

## 2020-04-12 DIAGNOSIS — I129 Hypertensive chronic kidney disease with stage 1 through stage 4 chronic kidney disease, or unspecified chronic kidney disease: Secondary | ICD-10-CM | POA: Diagnosis not present

## 2020-04-12 DIAGNOSIS — M1990 Unspecified osteoarthritis, unspecified site: Secondary | ICD-10-CM | POA: Diagnosis not present

## 2020-04-12 DIAGNOSIS — I872 Venous insufficiency (chronic) (peripheral): Secondary | ICD-10-CM | POA: Diagnosis not present

## 2020-04-12 DIAGNOSIS — E1151 Type 2 diabetes mellitus with diabetic peripheral angiopathy without gangrene: Secondary | ICD-10-CM | POA: Diagnosis not present

## 2020-04-17 DIAGNOSIS — I129 Hypertensive chronic kidney disease with stage 1 through stage 4 chronic kidney disease, or unspecified chronic kidney disease: Secondary | ICD-10-CM | POA: Diagnosis not present

## 2020-04-17 DIAGNOSIS — F0280 Dementia in other diseases classified elsewhere without behavioral disturbance: Secondary | ICD-10-CM | POA: Diagnosis not present

## 2020-04-17 DIAGNOSIS — M1990 Unspecified osteoarthritis, unspecified site: Secondary | ICD-10-CM | POA: Diagnosis not present

## 2020-04-17 DIAGNOSIS — E1122 Type 2 diabetes mellitus with diabetic chronic kidney disease: Secondary | ICD-10-CM | POA: Diagnosis not present

## 2020-04-17 DIAGNOSIS — E1151 Type 2 diabetes mellitus with diabetic peripheral angiopathy without gangrene: Secondary | ICD-10-CM | POA: Diagnosis not present

## 2020-04-17 DIAGNOSIS — N183 Chronic kidney disease, stage 3 unspecified: Secondary | ICD-10-CM | POA: Diagnosis not present

## 2020-04-17 DIAGNOSIS — D631 Anemia in chronic kidney disease: Secondary | ICD-10-CM | POA: Diagnosis not present

## 2020-04-17 DIAGNOSIS — I872 Venous insufficiency (chronic) (peripheral): Secondary | ICD-10-CM | POA: Diagnosis not present

## 2020-04-17 DIAGNOSIS — G309 Alzheimer's disease, unspecified: Secondary | ICD-10-CM | POA: Diagnosis not present

## 2020-04-20 DIAGNOSIS — I129 Hypertensive chronic kidney disease with stage 1 through stage 4 chronic kidney disease, or unspecified chronic kidney disease: Secondary | ICD-10-CM | POA: Diagnosis not present

## 2020-04-20 DIAGNOSIS — D631 Anemia in chronic kidney disease: Secondary | ICD-10-CM | POA: Diagnosis not present

## 2020-04-20 DIAGNOSIS — N183 Chronic kidney disease, stage 3 unspecified: Secondary | ICD-10-CM | POA: Diagnosis not present

## 2020-04-20 DIAGNOSIS — E1151 Type 2 diabetes mellitus with diabetic peripheral angiopathy without gangrene: Secondary | ICD-10-CM | POA: Diagnosis not present

## 2020-04-20 DIAGNOSIS — M1990 Unspecified osteoarthritis, unspecified site: Secondary | ICD-10-CM | POA: Diagnosis not present

## 2020-04-20 DIAGNOSIS — E1122 Type 2 diabetes mellitus with diabetic chronic kidney disease: Secondary | ICD-10-CM | POA: Diagnosis not present

## 2020-04-20 DIAGNOSIS — G309 Alzheimer's disease, unspecified: Secondary | ICD-10-CM | POA: Diagnosis not present

## 2020-04-20 DIAGNOSIS — F0280 Dementia in other diseases classified elsewhere without behavioral disturbance: Secondary | ICD-10-CM | POA: Diagnosis not present

## 2020-04-20 DIAGNOSIS — I872 Venous insufficiency (chronic) (peripheral): Secondary | ICD-10-CM | POA: Diagnosis not present

## 2020-04-24 DIAGNOSIS — N183 Chronic kidney disease, stage 3 unspecified: Secondary | ICD-10-CM | POA: Diagnosis not present

## 2020-04-24 DIAGNOSIS — I129 Hypertensive chronic kidney disease with stage 1 through stage 4 chronic kidney disease, or unspecified chronic kidney disease: Secondary | ICD-10-CM | POA: Diagnosis not present

## 2020-04-24 DIAGNOSIS — G309 Alzheimer's disease, unspecified: Secondary | ICD-10-CM | POA: Diagnosis not present

## 2020-04-24 DIAGNOSIS — M1990 Unspecified osteoarthritis, unspecified site: Secondary | ICD-10-CM | POA: Diagnosis not present

## 2020-04-24 DIAGNOSIS — E1151 Type 2 diabetes mellitus with diabetic peripheral angiopathy without gangrene: Secondary | ICD-10-CM | POA: Diagnosis not present

## 2020-04-24 DIAGNOSIS — F0280 Dementia in other diseases classified elsewhere without behavioral disturbance: Secondary | ICD-10-CM | POA: Diagnosis not present

## 2020-04-24 DIAGNOSIS — I872 Venous insufficiency (chronic) (peripheral): Secondary | ICD-10-CM | POA: Diagnosis not present

## 2020-04-24 DIAGNOSIS — E1122 Type 2 diabetes mellitus with diabetic chronic kidney disease: Secondary | ICD-10-CM | POA: Diagnosis not present

## 2020-04-24 DIAGNOSIS — D631 Anemia in chronic kidney disease: Secondary | ICD-10-CM | POA: Diagnosis not present

## 2020-04-27 DIAGNOSIS — F0280 Dementia in other diseases classified elsewhere without behavioral disturbance: Secondary | ICD-10-CM | POA: Diagnosis not present

## 2020-04-27 DIAGNOSIS — E1122 Type 2 diabetes mellitus with diabetic chronic kidney disease: Secondary | ICD-10-CM | POA: Diagnosis not present

## 2020-04-27 DIAGNOSIS — D631 Anemia in chronic kidney disease: Secondary | ICD-10-CM | POA: Diagnosis not present

## 2020-04-27 DIAGNOSIS — I129 Hypertensive chronic kidney disease with stage 1 through stage 4 chronic kidney disease, or unspecified chronic kidney disease: Secondary | ICD-10-CM | POA: Diagnosis not present

## 2020-04-27 DIAGNOSIS — E1151 Type 2 diabetes mellitus with diabetic peripheral angiopathy without gangrene: Secondary | ICD-10-CM | POA: Diagnosis not present

## 2020-04-27 DIAGNOSIS — N183 Chronic kidney disease, stage 3 unspecified: Secondary | ICD-10-CM | POA: Diagnosis not present

## 2020-04-27 DIAGNOSIS — M1990 Unspecified osteoarthritis, unspecified site: Secondary | ICD-10-CM | POA: Diagnosis not present

## 2020-04-27 DIAGNOSIS — G309 Alzheimer's disease, unspecified: Secondary | ICD-10-CM | POA: Diagnosis not present

## 2020-04-27 DIAGNOSIS — I872 Venous insufficiency (chronic) (peripheral): Secondary | ICD-10-CM | POA: Diagnosis not present

## 2020-04-28 DIAGNOSIS — G4733 Obstructive sleep apnea (adult) (pediatric): Secondary | ICD-10-CM | POA: Diagnosis not present

## 2020-04-29 DIAGNOSIS — M1990 Unspecified osteoarthritis, unspecified site: Secondary | ICD-10-CM | POA: Diagnosis not present

## 2020-04-29 DIAGNOSIS — G309 Alzheimer's disease, unspecified: Secondary | ICD-10-CM | POA: Diagnosis not present

## 2020-04-29 DIAGNOSIS — I872 Venous insufficiency (chronic) (peripheral): Secondary | ICD-10-CM | POA: Diagnosis not present

## 2020-04-29 DIAGNOSIS — D631 Anemia in chronic kidney disease: Secondary | ICD-10-CM | POA: Diagnosis not present

## 2020-04-29 DIAGNOSIS — E1151 Type 2 diabetes mellitus with diabetic peripheral angiopathy without gangrene: Secondary | ICD-10-CM | POA: Diagnosis not present

## 2020-04-29 DIAGNOSIS — I129 Hypertensive chronic kidney disease with stage 1 through stage 4 chronic kidney disease, or unspecified chronic kidney disease: Secondary | ICD-10-CM | POA: Diagnosis not present

## 2020-04-29 DIAGNOSIS — E1122 Type 2 diabetes mellitus with diabetic chronic kidney disease: Secondary | ICD-10-CM | POA: Diagnosis not present

## 2020-04-29 DIAGNOSIS — N183 Chronic kidney disease, stage 3 unspecified: Secondary | ICD-10-CM | POA: Diagnosis not present

## 2020-04-29 DIAGNOSIS — G4733 Obstructive sleep apnea (adult) (pediatric): Secondary | ICD-10-CM | POA: Diagnosis not present

## 2020-05-03 DIAGNOSIS — N1831 Chronic kidney disease, stage 3a: Secondary | ICD-10-CM | POA: Diagnosis not present

## 2020-05-03 DIAGNOSIS — E1122 Type 2 diabetes mellitus with diabetic chronic kidney disease: Secondary | ICD-10-CM | POA: Diagnosis not present

## 2020-05-03 DIAGNOSIS — Z1389 Encounter for screening for other disorder: Secondary | ICD-10-CM | POA: Diagnosis not present

## 2020-05-03 DIAGNOSIS — Z0000 Encounter for general adult medical examination without abnormal findings: Secondary | ICD-10-CM | POA: Diagnosis not present

## 2020-05-03 DIAGNOSIS — K219 Gastro-esophageal reflux disease without esophagitis: Secondary | ICD-10-CM | POA: Diagnosis not present

## 2020-05-03 DIAGNOSIS — D692 Other nonthrombocytopenic purpura: Secondary | ICD-10-CM | POA: Diagnosis not present

## 2020-05-03 DIAGNOSIS — I129 Hypertensive chronic kidney disease with stage 1 through stage 4 chronic kidney disease, or unspecified chronic kidney disease: Secondary | ICD-10-CM | POA: Diagnosis not present

## 2020-05-03 DIAGNOSIS — I6930 Unspecified sequelae of cerebral infarction: Secondary | ICD-10-CM | POA: Diagnosis not present

## 2020-05-03 DIAGNOSIS — E782 Mixed hyperlipidemia: Secondary | ICD-10-CM | POA: Diagnosis not present

## 2020-05-17 DIAGNOSIS — N183 Chronic kidney disease, stage 3 unspecified: Secondary | ICD-10-CM | POA: Diagnosis not present

## 2020-05-17 DIAGNOSIS — I872 Venous insufficiency (chronic) (peripheral): Secondary | ICD-10-CM | POA: Diagnosis not present

## 2020-05-17 DIAGNOSIS — E1151 Type 2 diabetes mellitus with diabetic peripheral angiopathy without gangrene: Secondary | ICD-10-CM | POA: Diagnosis not present

## 2020-05-17 DIAGNOSIS — F0280 Dementia in other diseases classified elsewhere without behavioral disturbance: Secondary | ICD-10-CM | POA: Diagnosis not present

## 2020-05-17 DIAGNOSIS — M1990 Unspecified osteoarthritis, unspecified site: Secondary | ICD-10-CM | POA: Diagnosis not present

## 2020-05-17 DIAGNOSIS — G309 Alzheimer's disease, unspecified: Secondary | ICD-10-CM | POA: Diagnosis not present

## 2020-05-17 DIAGNOSIS — E1122 Type 2 diabetes mellitus with diabetic chronic kidney disease: Secondary | ICD-10-CM | POA: Diagnosis not present

## 2020-05-17 DIAGNOSIS — I129 Hypertensive chronic kidney disease with stage 1 through stage 4 chronic kidney disease, or unspecified chronic kidney disease: Secondary | ICD-10-CM | POA: Diagnosis not present

## 2020-05-17 DIAGNOSIS — D631 Anemia in chronic kidney disease: Secondary | ICD-10-CM | POA: Diagnosis not present

## 2020-05-22 DIAGNOSIS — M1990 Unspecified osteoarthritis, unspecified site: Secondary | ICD-10-CM | POA: Diagnosis not present

## 2020-05-22 DIAGNOSIS — F0280 Dementia in other diseases classified elsewhere without behavioral disturbance: Secondary | ICD-10-CM | POA: Diagnosis not present

## 2020-05-22 DIAGNOSIS — D631 Anemia in chronic kidney disease: Secondary | ICD-10-CM | POA: Diagnosis not present

## 2020-05-22 DIAGNOSIS — E1151 Type 2 diabetes mellitus with diabetic peripheral angiopathy without gangrene: Secondary | ICD-10-CM | POA: Diagnosis not present

## 2020-05-22 DIAGNOSIS — I129 Hypertensive chronic kidney disease with stage 1 through stage 4 chronic kidney disease, or unspecified chronic kidney disease: Secondary | ICD-10-CM | POA: Diagnosis not present

## 2020-05-22 DIAGNOSIS — N183 Chronic kidney disease, stage 3 unspecified: Secondary | ICD-10-CM | POA: Diagnosis not present

## 2020-05-22 DIAGNOSIS — G309 Alzheimer's disease, unspecified: Secondary | ICD-10-CM | POA: Diagnosis not present

## 2020-05-22 DIAGNOSIS — I872 Venous insufficiency (chronic) (peripheral): Secondary | ICD-10-CM | POA: Diagnosis not present

## 2020-05-22 DIAGNOSIS — E1122 Type 2 diabetes mellitus with diabetic chronic kidney disease: Secondary | ICD-10-CM | POA: Diagnosis not present

## 2020-05-29 DIAGNOSIS — G309 Alzheimer's disease, unspecified: Secondary | ICD-10-CM | POA: Diagnosis not present

## 2020-05-29 DIAGNOSIS — E1151 Type 2 diabetes mellitus with diabetic peripheral angiopathy without gangrene: Secondary | ICD-10-CM | POA: Diagnosis not present

## 2020-05-29 DIAGNOSIS — I872 Venous insufficiency (chronic) (peripheral): Secondary | ICD-10-CM | POA: Diagnosis not present

## 2020-05-29 DIAGNOSIS — N183 Chronic kidney disease, stage 3 unspecified: Secondary | ICD-10-CM | POA: Diagnosis not present

## 2020-05-29 DIAGNOSIS — F0280 Dementia in other diseases classified elsewhere without behavioral disturbance: Secondary | ICD-10-CM | POA: Diagnosis not present

## 2020-05-29 DIAGNOSIS — D631 Anemia in chronic kidney disease: Secondary | ICD-10-CM | POA: Diagnosis not present

## 2020-05-29 DIAGNOSIS — M1990 Unspecified osteoarthritis, unspecified site: Secondary | ICD-10-CM | POA: Diagnosis not present

## 2020-05-29 DIAGNOSIS — G4733 Obstructive sleep apnea (adult) (pediatric): Secondary | ICD-10-CM | POA: Diagnosis not present

## 2020-05-29 DIAGNOSIS — I129 Hypertensive chronic kidney disease with stage 1 through stage 4 chronic kidney disease, or unspecified chronic kidney disease: Secondary | ICD-10-CM | POA: Diagnosis not present

## 2020-05-29 DIAGNOSIS — E1122 Type 2 diabetes mellitus with diabetic chronic kidney disease: Secondary | ICD-10-CM | POA: Diagnosis not present

## 2020-06-06 ENCOUNTER — Encounter: Payer: Self-pay | Admitting: Adult Health

## 2020-06-06 ENCOUNTER — Ambulatory Visit: Payer: Medicare PPO | Admitting: Adult Health

## 2020-06-06 ENCOUNTER — Other Ambulatory Visit: Payer: Self-pay

## 2020-06-06 VITALS — BP 167/80 | HR 77 | Wt 200.0 lb

## 2020-06-06 DIAGNOSIS — F0280 Dementia in other diseases classified elsewhere without behavioral disturbance: Secondary | ICD-10-CM | POA: Diagnosis not present

## 2020-06-06 DIAGNOSIS — G301 Alzheimer's disease with late onset: Secondary | ICD-10-CM

## 2020-06-06 NOTE — Progress Notes (Signed)
PATIENT: [name] [name] DOB: 02-16-1929  REASON FOR VISIT: follow up HISTORY FROM: patient  HISTORY OF PRESENT ILLNESS: Today 06/06/20:  [name] [name] is an 86 year old male with a history of memory disturbance.  He returns today for follow-up.  He is currently on Pravagen.  His wife reports that his memory has gotten worse.  He does require assistance with all ADLs.  His wife manages all of his appointments, medications and finances.  She states that he may have some mood changes but they are typically manageable.  She does report that he is sleeping more during the day.  He returns today for an evaluation.  HISTORY [name] [name] is an 86 year old male with a history of memory disturbance.  He returns today for follow-up.  He is currently on Pravagen.  Requires assistance with ADLs.  His wife manages the finances.  Reports some short-term memory loss.  Wife reports he has had some occasions where he may act out dreams.  This is very rare.  Wife  denies any new symptoms.   REVIEW OF SYSTEMS: Out of a complete 14 system review of symptoms, the patient complains only of the following symptoms, and all other reviewed systems are negative.  See HPI  ALLERGIES: Allergies  Allergen Reactions  . Donepezil Shortness Of Breath and Swelling  . Lisinopril Anaphylaxis, Shortness Of Breath and Swelling    HAD TO COME TO THE E.D. AFTER TAKING THIS!!  . [name] Hcl] Shortness Of Breath and Swelling  . Robaxin [Methocarbamol] Anaphylaxis, Shortness Of Breath and Swelling    HAD TO COME TO THE E.D. AFTER TAKING THIS!!    HOME MEDICATIONS: Outpatient Medications Prior to Visit  Medication Sig Dispense Refill  . ACCU-CHEK SMARTVIEW test strip USE ONCE A DAY AS DIRECTED  1  . aspirin EC 325 MG EC tablet Take 1 tablet (325 mg total) by mouth daily. 30 tablet 4  . B-D ULTRAFINE III SHORT PEN 31G X 8 MM MISC   1  . Cholecalciferol (VITAMIN D-3 PO) Take 1,000 Units by mouth daily.       . citalopram (CELEXA) 20 MG tablet TAKE 1 TABLET (20 MG TOTAL) BY MOUTH DAILY. 90 tablet 3  . doxazosin (CARDURA) 4 MG tablet Take 4 mg by mouth daily.    . folic acid (FOLVITE) 800 MCG tablet Take 800 mcg by mouth daily.    Marland Kitchen guaiFENesin-dextromethorphan (ROBITUSSIN DM) 100-10 MG/5ML syrup Take 5 mLs by mouth every 4 (four) hours as needed for cough. 118 mL 0  . insulin glargine (LANTUS SOLOSTAR) 100 UNIT/ML injection Inject 22 Units into the skin daily after supper.     . loratadine-pseudoephedrine (CLARITIN-D 24-HOUR) 10-240 MG per 24 hr tablet Take 1 tablet by mouth daily as needed for allergies.    . metFORMIN (GLUCOPHAGE) 1000 MG tablet Take 1,000 mg by mouth 2 (two) times daily with a meal.    . methotrexate 2.5 MG tablet Take 12.5 mg by mouth See admin instructions. Every Thursday    . Multiple Vitamin (MULTIVITAMIN WITH MINERALS) TABS Take 1 tablet by mouth daily.    Marland Kitchen omeprazole (PRILOSEC) 40 MG capsule Take 40 mg by mouth daily.  0  . simvastatin (ZOCOR) 40 MG tablet Take 40 mg by mouth daily.  0   No facility-administered medications prior to visit.    PAST MEDICAL HISTORY: Past Medical History:  Diagnosis Date  . Acute CVA (cerebrovascular accident) (HCC) 04/05/2017  . Acute metabolic encephalopathy 04/04/2017  .  Anxiety   . CAP (community acquired pneumonia) 04/04/2017  . Change in bowel habits 11/06/2016  . Dementia (HCC)   . Diabetes mellitus   . Diabetes mellitus (HCC) 04/04/2017  . Essential tremor 08/20/2015  . Hyperlipidemia   . Late onset Alzheimer's disease without behavioral disturbance (HCC) 10/13/2018  . MCI (mild cognitive impairment) with memory loss 08/20/2015  . Memory loss 02/13/2015  . Obesity   . OBSTRUCTIVE SLEEP APNEA 07/19/2008   Qualifier: Diagnosis of  By: Thad Ranger LPN, Ervin Rothbauer    . Osteoarthritis of knee 10/01/2017  . PNA (pneumonia)   . Sleep apnea    uses CPAP at night to sleep  . 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 Stroke (cerebrum) (HCC)   . Stroke  (HCC)   . Tremor, essential 10/13/2018  . Umbilical hernia without obstruction and without gangrene 11/06/2016  . Vision loss, central, left 10/13/2018    PAST SURGICAL HISTORY: Past Surgical History:  Procedure Laterality Date  . cipap    . HERNIA REPAIR Right   . REPLACEMENT TOTAL KNEE Left    Dr Bethann Goo / Dr Fannie Knee  . TRANSURETHRAL RESECTION OF PROSTATE      FAMILY HISTORY: Family History  Problem Relation Age of Onset  . Liver disease Mother   . Cancer - Lung Father        smoker    SOCIAL HISTORY: Social History   Socioeconomic History  . Marital status: Married    Spouse name: [name]  . Number of children: 2  . Years of education: 12th  . Highest education level: Not on file  Occupational History  . Occupation: retired  Tobacco Use  . Smoking status: Never Smoker  . Smokeless tobacco: Never Used  Substance and Sexual Activity  . Alcohol use: No  . Drug use: No  . Sexual activity: Not on file  Other Topics Concern  . Not on file  Social History Narrative   Patient lives at home and drinks caffinated  Drinks daily. Married, 2 kids.  Caffeine 2-3 cups daily.    Hs Graduate.  Retired.     Social Determinants of Health   Financial Resource Strain:   . Difficulty of Paying Living Expenses: Not on file  Food Insecurity:   . Worried About Programme researcher, broadcasting/film/video in the Last Year: Not on file  . Ran Out of Food in the Last Year: Not on file  Transportation Needs:   . Lack of Transportation (Medical): Not on file  . Lack of Transportation (Non-Medical): Not on file  Physical Activity:   . Days of Exercise per Week: Not on file  . Minutes of Exercise per Session: Not on file  Stress:   . Feeling of Stress : Not on file  Social Connections:   . Frequency of Communication with Friends and Family: Not on file  . Frequency of Social Gatherings with Friends and Family: Not on file  . Attends Religious Services: Not on file  . Active Member of Clubs or Organizations: Not  on file  . Attends Banker Meetings: Not on file  . Marital Status: Not on file  Intimate Partner Violence:   . Fear of Current or Ex-Partner: Not on file  . Emotionally Abused: Not on file  . Physically Abused: Not on file  . Sexually Abused: Not on file      PHYSICAL EXAM  Vitals:   06/06/20 1443  BP: (!) 167/80  Pulse: 77  Weight: 200 lb (90.7 kg)  By any Body mass index is 29.11 kg/m.  Generalized: Well developed, in no acute distress   Neurological examination  Mentation: Alert oriented to time, place, history taking. Follows all commands speech and language fluent Cranial nerve II-XII: Pupils were equal round reactive to light. Extraocular movements were full, visual field were full on confrontational test. Head turning and shoulder shrug  were normal and symmetric. Motor: The motor testing reveals 5 over 5 strength of all 4 extremities. Good symmetric motor tone is noted throughout.  Sensory: Sensory testing is intact to soft touch on all 4 extremities. No evidence of extinction is noted.  Coordination: Cerebellar testing reveals good finger-nose-finger and heel-to-shin bilaterally.  Gait and station: Gait is normal.    DIAGNOSTIC DATA (LABS, IMAGING, TESTING) - I reviewed patient records, labs, notes, testing and imaging myself where available.  Lab Results  Component Value Date   WBC 7.4 05/10/2018   HGB 11.1 (L) 05/10/2018   HCT 34.3 (L) 05/10/2018   MCV 102.7 (H) 05/10/2018   PLT 248 05/10/2018      Component Value Date/Time   NA 139 02/02/2020 0945   K 4.9 02/02/2020 0945   CL 102 02/02/2020 0945   CO2 21 02/02/2020 0945   GLUCOSE 99 02/02/2020 0945   GLUCOSE 127 (H) 05/10/2018 1830   BUN 22 02/02/2020 0945   CREATININE 1.28 (H) 02/02/2020 0945   CALCIUM 9.2 02/02/2020 0945   PROT 6.5 05/10/2018 1830   ALBUMIN 3.8 05/10/2018 1830   AST 17 05/10/2018 1830   ALT 18 05/10/2018 1830   ALKPHOS 45 05/10/2018 1830   BILITOT 0.5 05/10/2018  1830   GFRNONAA 49 (L) 02/02/2020 0945   GFRAA 57 (L) 02/02/2020 0945   Lab Results  Component Value Date   CHOL 128 04/05/2017   HDL 51 04/05/2017   LDLCALC 57 04/05/2017   TRIG 99 04/05/2017   CHOLHDL 2.5 04/05/2017   Lab Results  Component Value Date   HGBA1C 7.2 (H) 04/04/2017   No results found for: VITAMINB12 Lab Results  Component Value Date   TSH 2.130 02/02/2020      ASSESSMENT AND PLAN 86 y.o. year old male  has a past medical history of Acute CVA (cerebrovascular accident) (HCC) (04/05/2017), Acute metabolic encephalopathy (04/04/2017), Anxiety, CAP (community acquired pneumonia) (04/04/2017), Change in bowel habits (11/06/2016), Dementia (HCC), Diabetes mellitus, Diabetes mellitus (HCC) (04/04/2017), Essential tremor (08/20/2015), Hyperlipidemia, Late onset Alzheimer's disease without behavioral disturbance (HCC) (10/13/2018), MCI (mild cognitive impairment) with memory loss (08/20/2015), Memory loss (02/13/2015), Obesity, OBSTRUCTIVE SLEEP APNEA (07/19/2008), Osteoarthritis of knee (10/01/2017), PNA (pneumonia), Sleep apnea, Sleep myoclonus (08/20/2015), Stroke (cerebrum) (HCC), Stroke (HCC), Tremor, essential (10/13/2018), Umbilical hernia without obstruction and without gangrene (11/06/2016), and Vision loss, central, left (10/13/2018). here with:  1.  Alzheimer's disease   Continue prevagen  Patient's wife would like him to follow-up here yearly  Advised if his symptoms worsen or he develops new symptoms they should let us know  Follow-up in 1 year    I spent 25 minutes of face-to-face and non-face-to-face time with patient.  This included previsit chart review, lab review, study review, order entry, electronic health record documentation, patient education.  Butch Penny, MSN, NP-C 06/06/2020, 2:58 PM [facility] [address], [address] [place], [place] [address] [phone]

## 2020-06-06 NOTE — Patient Instructions (Signed)
Your Plan:  Continue pravagen  If your symptoms worsen or you develop new symptoms please let us know.   Thank you for coming to see [place] at [facility]. I hope we have been able to provide you high quality care today.  You may receive a patient satisfaction survey over the next few weeks. We would appreciate your feedback and comments so that we may continue to improve ourselves and the health of our patients.

## 2020-06-20 ENCOUNTER — Ambulatory Visit: Payer: Medicare PPO | Admitting: Podiatry

## 2020-06-20 DIAGNOSIS — N183 Chronic kidney disease, stage 3 unspecified: Secondary | ICD-10-CM | POA: Diagnosis not present

## 2020-06-20 DIAGNOSIS — F0280 Dementia in other diseases classified elsewhere without behavioral disturbance: Secondary | ICD-10-CM | POA: Diagnosis not present

## 2020-06-20 DIAGNOSIS — I872 Venous insufficiency (chronic) (peripheral): Secondary | ICD-10-CM | POA: Diagnosis not present

## 2020-06-20 DIAGNOSIS — D631 Anemia in chronic kidney disease: Secondary | ICD-10-CM | POA: Diagnosis not present

## 2020-06-20 DIAGNOSIS — M1990 Unspecified osteoarthritis, unspecified site: Secondary | ICD-10-CM | POA: Diagnosis not present

## 2020-06-20 DIAGNOSIS — E1122 Type 2 diabetes mellitus with diabetic chronic kidney disease: Secondary | ICD-10-CM | POA: Diagnosis not present

## 2020-06-20 DIAGNOSIS — I129 Hypertensive chronic kidney disease with stage 1 through stage 4 chronic kidney disease, or unspecified chronic kidney disease: Secondary | ICD-10-CM | POA: Diagnosis not present

## 2020-06-20 DIAGNOSIS — E1151 Type 2 diabetes mellitus with diabetic peripheral angiopathy without gangrene: Secondary | ICD-10-CM | POA: Diagnosis not present

## 2020-06-20 DIAGNOSIS — G309 Alzheimer's disease, unspecified: Secondary | ICD-10-CM | POA: Diagnosis not present

## 2020-06-26 DIAGNOSIS — M1990 Unspecified osteoarthritis, unspecified site: Secondary | ICD-10-CM | POA: Diagnosis not present

## 2020-06-26 DIAGNOSIS — I129 Hypertensive chronic kidney disease with stage 1 through stage 4 chronic kidney disease, or unspecified chronic kidney disease: Secondary | ICD-10-CM | POA: Diagnosis not present

## 2020-06-26 DIAGNOSIS — E1151 Type 2 diabetes mellitus with diabetic peripheral angiopathy without gangrene: Secondary | ICD-10-CM | POA: Diagnosis not present

## 2020-06-26 DIAGNOSIS — E1122 Type 2 diabetes mellitus with diabetic chronic kidney disease: Secondary | ICD-10-CM | POA: Diagnosis not present

## 2020-06-26 DIAGNOSIS — N183 Chronic kidney disease, stage 3 unspecified: Secondary | ICD-10-CM | POA: Diagnosis not present

## 2020-06-26 DIAGNOSIS — D631 Anemia in chronic kidney disease: Secondary | ICD-10-CM | POA: Diagnosis not present

## 2020-06-26 DIAGNOSIS — G309 Alzheimer's disease, unspecified: Secondary | ICD-10-CM | POA: Diagnosis not present

## 2020-06-26 DIAGNOSIS — I872 Venous insufficiency (chronic) (peripheral): Secondary | ICD-10-CM | POA: Diagnosis not present

## 2020-06-26 DIAGNOSIS — F0280 Dementia in other diseases classified elsewhere without behavioral disturbance: Secondary | ICD-10-CM | POA: Diagnosis not present

## 2020-06-27 DIAGNOSIS — G4733 Obstructive sleep apnea (adult) (pediatric): Secondary | ICD-10-CM | POA: Diagnosis not present

## 2020-06-28 DIAGNOSIS — E1122 Type 2 diabetes mellitus with diabetic chronic kidney disease: Secondary | ICD-10-CM | POA: Diagnosis not present

## 2020-06-28 DIAGNOSIS — I129 Hypertensive chronic kidney disease with stage 1 through stage 4 chronic kidney disease, or unspecified chronic kidney disease: Secondary | ICD-10-CM | POA: Diagnosis not present

## 2020-06-28 DIAGNOSIS — F0280 Dementia in other diseases classified elsewhere without behavioral disturbance: Secondary | ICD-10-CM | POA: Diagnosis not present

## 2020-06-28 DIAGNOSIS — G4733 Obstructive sleep apnea (adult) (pediatric): Secondary | ICD-10-CM | POA: Diagnosis not present

## 2020-06-28 DIAGNOSIS — N183 Chronic kidney disease, stage 3 unspecified: Secondary | ICD-10-CM | POA: Diagnosis not present

## 2020-06-28 DIAGNOSIS — M1990 Unspecified osteoarthritis, unspecified site: Secondary | ICD-10-CM | POA: Diagnosis not present

## 2020-06-28 DIAGNOSIS — I872 Venous insufficiency (chronic) (peripheral): Secondary | ICD-10-CM | POA: Diagnosis not present

## 2020-06-28 DIAGNOSIS — G309 Alzheimer's disease, unspecified: Secondary | ICD-10-CM | POA: Diagnosis not present

## 2020-06-28 DIAGNOSIS — D631 Anemia in chronic kidney disease: Secondary | ICD-10-CM | POA: Diagnosis not present

## 2020-06-29 DIAGNOSIS — G309 Alzheimer's disease, unspecified: Secondary | ICD-10-CM | POA: Diagnosis not present

## 2020-06-29 DIAGNOSIS — N183 Chronic kidney disease, stage 3 unspecified: Secondary | ICD-10-CM | POA: Diagnosis not present

## 2020-06-29 DIAGNOSIS — E1122 Type 2 diabetes mellitus with diabetic chronic kidney disease: Secondary | ICD-10-CM | POA: Diagnosis not present

## 2020-06-29 DIAGNOSIS — M1990 Unspecified osteoarthritis, unspecified site: Secondary | ICD-10-CM | POA: Diagnosis not present

## 2020-06-29 DIAGNOSIS — F0280 Dementia in other diseases classified elsewhere without behavioral disturbance: Secondary | ICD-10-CM | POA: Diagnosis not present

## 2020-06-29 DIAGNOSIS — I129 Hypertensive chronic kidney disease with stage 1 through stage 4 chronic kidney disease, or unspecified chronic kidney disease: Secondary | ICD-10-CM | POA: Diagnosis not present

## 2020-06-29 DIAGNOSIS — I872 Venous insufficiency (chronic) (peripheral): Secondary | ICD-10-CM | POA: Diagnosis not present

## 2020-06-29 DIAGNOSIS — D631 Anemia in chronic kidney disease: Secondary | ICD-10-CM | POA: Diagnosis not present

## 2020-06-29 DIAGNOSIS — E1151 Type 2 diabetes mellitus with diabetic peripheral angiopathy without gangrene: Secondary | ICD-10-CM | POA: Diagnosis not present

## 2020-07-05 DIAGNOSIS — M1990 Unspecified osteoarthritis, unspecified site: Secondary | ICD-10-CM | POA: Diagnosis not present

## 2020-07-05 DIAGNOSIS — D631 Anemia in chronic kidney disease: Secondary | ICD-10-CM | POA: Diagnosis not present

## 2020-07-05 DIAGNOSIS — I872 Venous insufficiency (chronic) (peripheral): Secondary | ICD-10-CM | POA: Diagnosis not present

## 2020-07-05 DIAGNOSIS — N183 Chronic kidney disease, stage 3 unspecified: Secondary | ICD-10-CM | POA: Diagnosis not present

## 2020-07-05 DIAGNOSIS — F0280 Dementia in other diseases classified elsewhere without behavioral disturbance: Secondary | ICD-10-CM | POA: Diagnosis not present

## 2020-07-05 DIAGNOSIS — E1151 Type 2 diabetes mellitus with diabetic peripheral angiopathy without gangrene: Secondary | ICD-10-CM | POA: Diagnosis not present

## 2020-07-05 DIAGNOSIS — I129 Hypertensive chronic kidney disease with stage 1 through stage 4 chronic kidney disease, or unspecified chronic kidney disease: Secondary | ICD-10-CM | POA: Diagnosis not present

## 2020-07-05 DIAGNOSIS — G309 Alzheimer's disease, unspecified: Secondary | ICD-10-CM | POA: Diagnosis not present

## 2020-07-05 DIAGNOSIS — E1122 Type 2 diabetes mellitus with diabetic chronic kidney disease: Secondary | ICD-10-CM | POA: Diagnosis not present

## 2020-07-09 DIAGNOSIS — F0280 Dementia in other diseases classified elsewhere without behavioral disturbance: Secondary | ICD-10-CM | POA: Diagnosis not present

## 2020-07-09 DIAGNOSIS — E1122 Type 2 diabetes mellitus with diabetic chronic kidney disease: Secondary | ICD-10-CM | POA: Diagnosis not present

## 2020-07-09 DIAGNOSIS — N183 Chronic kidney disease, stage 3 unspecified: Secondary | ICD-10-CM | POA: Diagnosis not present

## 2020-07-09 DIAGNOSIS — I129 Hypertensive chronic kidney disease with stage 1 through stage 4 chronic kidney disease, or unspecified chronic kidney disease: Secondary | ICD-10-CM | POA: Diagnosis not present

## 2020-07-09 DIAGNOSIS — G309 Alzheimer's disease, unspecified: Secondary | ICD-10-CM | POA: Diagnosis not present

## 2020-07-09 DIAGNOSIS — I872 Venous insufficiency (chronic) (peripheral): Secondary | ICD-10-CM | POA: Diagnosis not present

## 2020-07-09 DIAGNOSIS — E1151 Type 2 diabetes mellitus with diabetic peripheral angiopathy without gangrene: Secondary | ICD-10-CM | POA: Diagnosis not present

## 2020-07-09 DIAGNOSIS — M1990 Unspecified osteoarthritis, unspecified site: Secondary | ICD-10-CM | POA: Diagnosis not present

## 2020-07-09 DIAGNOSIS — D631 Anemia in chronic kidney disease: Secondary | ICD-10-CM | POA: Diagnosis not present

## 2020-07-12 DIAGNOSIS — I872 Venous insufficiency (chronic) (peripheral): Secondary | ICD-10-CM | POA: Diagnosis not present

## 2020-07-12 DIAGNOSIS — N183 Chronic kidney disease, stage 3 unspecified: Secondary | ICD-10-CM | POA: Diagnosis not present

## 2020-07-12 DIAGNOSIS — I129 Hypertensive chronic kidney disease with stage 1 through stage 4 chronic kidney disease, or unspecified chronic kidney disease: Secondary | ICD-10-CM | POA: Diagnosis not present

## 2020-07-12 DIAGNOSIS — D631 Anemia in chronic kidney disease: Secondary | ICD-10-CM | POA: Diagnosis not present

## 2020-07-12 DIAGNOSIS — E1122 Type 2 diabetes mellitus with diabetic chronic kidney disease: Secondary | ICD-10-CM | POA: Diagnosis not present

## 2020-07-12 DIAGNOSIS — M1990 Unspecified osteoarthritis, unspecified site: Secondary | ICD-10-CM | POA: Diagnosis not present

## 2020-07-12 DIAGNOSIS — G309 Alzheimer's disease, unspecified: Secondary | ICD-10-CM | POA: Diagnosis not present

## 2020-07-12 DIAGNOSIS — E1151 Type 2 diabetes mellitus with diabetic peripheral angiopathy without gangrene: Secondary | ICD-10-CM | POA: Diagnosis not present

## 2020-07-12 DIAGNOSIS — F0280 Dementia in other diseases classified elsewhere without behavioral disturbance: Secondary | ICD-10-CM | POA: Diagnosis not present

## 2020-07-19 DIAGNOSIS — D631 Anemia in chronic kidney disease: Secondary | ICD-10-CM | POA: Diagnosis not present

## 2020-07-19 DIAGNOSIS — E1122 Type 2 diabetes mellitus with diabetic chronic kidney disease: Secondary | ICD-10-CM | POA: Diagnosis not present

## 2020-07-19 DIAGNOSIS — I872 Venous insufficiency (chronic) (peripheral): Secondary | ICD-10-CM | POA: Diagnosis not present

## 2020-07-19 DIAGNOSIS — M1990 Unspecified osteoarthritis, unspecified site: Secondary | ICD-10-CM | POA: Diagnosis not present

## 2020-07-19 DIAGNOSIS — F0280 Dementia in other diseases classified elsewhere without behavioral disturbance: Secondary | ICD-10-CM | POA: Diagnosis not present

## 2020-07-19 DIAGNOSIS — G309 Alzheimer's disease, unspecified: Secondary | ICD-10-CM | POA: Diagnosis not present

## 2020-07-19 DIAGNOSIS — I129 Hypertensive chronic kidney disease with stage 1 through stage 4 chronic kidney disease, or unspecified chronic kidney disease: Secondary | ICD-10-CM | POA: Diagnosis not present

## 2020-07-19 DIAGNOSIS — E1151 Type 2 diabetes mellitus with diabetic peripheral angiopathy without gangrene: Secondary | ICD-10-CM | POA: Diagnosis not present

## 2020-07-19 DIAGNOSIS — N183 Chronic kidney disease, stage 3 unspecified: Secondary | ICD-10-CM | POA: Diagnosis not present

## 2020-07-25 DIAGNOSIS — I129 Hypertensive chronic kidney disease with stage 1 through stage 4 chronic kidney disease, or unspecified chronic kidney disease: Secondary | ICD-10-CM | POA: Diagnosis not present

## 2020-07-25 DIAGNOSIS — M1990 Unspecified osteoarthritis, unspecified site: Secondary | ICD-10-CM | POA: Diagnosis not present

## 2020-07-25 DIAGNOSIS — F0280 Dementia in other diseases classified elsewhere without behavioral disturbance: Secondary | ICD-10-CM | POA: Diagnosis not present

## 2020-07-25 DIAGNOSIS — E1122 Type 2 diabetes mellitus with diabetic chronic kidney disease: Secondary | ICD-10-CM | POA: Diagnosis not present

## 2020-07-25 DIAGNOSIS — N183 Chronic kidney disease, stage 3 unspecified: Secondary | ICD-10-CM | POA: Diagnosis not present

## 2020-07-25 DIAGNOSIS — I872 Venous insufficiency (chronic) (peripheral): Secondary | ICD-10-CM | POA: Diagnosis not present

## 2020-07-25 DIAGNOSIS — D631 Anemia in chronic kidney disease: Secondary | ICD-10-CM | POA: Diagnosis not present

## 2020-07-25 DIAGNOSIS — G309 Alzheimer's disease, unspecified: Secondary | ICD-10-CM | POA: Diagnosis not present

## 2020-07-25 DIAGNOSIS — E1151 Type 2 diabetes mellitus with diabetic peripheral angiopathy without gangrene: Secondary | ICD-10-CM | POA: Diagnosis not present

## 2020-07-27 DIAGNOSIS — G309 Alzheimer's disease, unspecified: Secondary | ICD-10-CM | POA: Diagnosis not present

## 2020-07-27 DIAGNOSIS — I129 Hypertensive chronic kidney disease with stage 1 through stage 4 chronic kidney disease, or unspecified chronic kidney disease: Secondary | ICD-10-CM | POA: Diagnosis not present

## 2020-07-27 DIAGNOSIS — F0280 Dementia in other diseases classified elsewhere without behavioral disturbance: Secondary | ICD-10-CM | POA: Diagnosis not present

## 2020-07-27 DIAGNOSIS — I872 Venous insufficiency (chronic) (peripheral): Secondary | ICD-10-CM | POA: Diagnosis not present

## 2020-07-27 DIAGNOSIS — E1122 Type 2 diabetes mellitus with diabetic chronic kidney disease: Secondary | ICD-10-CM | POA: Diagnosis not present

## 2020-07-27 DIAGNOSIS — D631 Anemia in chronic kidney disease: Secondary | ICD-10-CM | POA: Diagnosis not present

## 2020-07-27 DIAGNOSIS — M1990 Unspecified osteoarthritis, unspecified site: Secondary | ICD-10-CM | POA: Diagnosis not present

## 2020-07-27 DIAGNOSIS — E1151 Type 2 diabetes mellitus with diabetic peripheral angiopathy without gangrene: Secondary | ICD-10-CM | POA: Diagnosis not present

## 2020-07-27 DIAGNOSIS — N183 Chronic kidney disease, stage 3 unspecified: Secondary | ICD-10-CM | POA: Diagnosis not present

## 2020-07-30 DIAGNOSIS — E119 Type 2 diabetes mellitus without complications: Secondary | ICD-10-CM | POA: Diagnosis not present

## 2020-07-30 DIAGNOSIS — H02052 Trichiasis without entropian right lower eyelid: Secondary | ICD-10-CM | POA: Diagnosis not present

## 2020-07-30 DIAGNOSIS — H04123 Dry eye syndrome of bilateral lacrimal glands: Secondary | ICD-10-CM | POA: Diagnosis not present

## 2020-07-30 DIAGNOSIS — H2513 Age-related nuclear cataract, bilateral: Secondary | ICD-10-CM | POA: Diagnosis not present

## 2020-09-18 ENCOUNTER — Ambulatory Visit: Payer: Medicare PPO | Admitting: Podiatry

## 2020-09-21 ENCOUNTER — Ambulatory Visit: Payer: Medicare PPO | Admitting: Podiatry

## 2020-09-26 DIAGNOSIS — G4733 Obstructive sleep apnea (adult) (pediatric): Secondary | ICD-10-CM | POA: Diagnosis not present

## 2020-11-08 DIAGNOSIS — E1122 Type 2 diabetes mellitus with diabetic chronic kidney disease: Secondary | ICD-10-CM | POA: Diagnosis not present

## 2020-11-08 DIAGNOSIS — D692 Other nonthrombocytopenic purpura: Secondary | ICD-10-CM | POA: Diagnosis not present

## 2020-11-08 DIAGNOSIS — N1831 Chronic kidney disease, stage 3a: Secondary | ICD-10-CM | POA: Diagnosis not present

## 2020-11-08 DIAGNOSIS — I6930 Unspecified sequelae of cerebral infarction: Secondary | ICD-10-CM | POA: Diagnosis not present

## 2020-11-08 DIAGNOSIS — K219 Gastro-esophageal reflux disease without esophagitis: Secondary | ICD-10-CM | POA: Diagnosis not present

## 2020-11-08 DIAGNOSIS — I129 Hypertensive chronic kidney disease with stage 1 through stage 4 chronic kidney disease, or unspecified chronic kidney disease: Secondary | ICD-10-CM | POA: Diagnosis not present

## 2020-11-08 DIAGNOSIS — G309 Alzheimer's disease, unspecified: Secondary | ICD-10-CM | POA: Diagnosis not present

## 2020-11-08 DIAGNOSIS — E782 Mixed hyperlipidemia: Secondary | ICD-10-CM | POA: Diagnosis not present

## 2020-11-08 DIAGNOSIS — D649 Anemia, unspecified: Secondary | ICD-10-CM | POA: Diagnosis not present

## 2020-11-20 ENCOUNTER — Encounter: Payer: Self-pay | Admitting: Radiology

## 2020-11-20 ENCOUNTER — Encounter: Payer: Self-pay | Admitting: Cardiology

## 2020-11-20 ENCOUNTER — Ambulatory Visit: Payer: Medicare PPO | Admitting: Cardiology

## 2020-11-20 ENCOUNTER — Other Ambulatory Visit: Payer: Self-pay

## 2020-11-20 ENCOUNTER — Ambulatory Visit (INDEPENDENT_AMBULATORY_CARE_PROVIDER_SITE_OTHER): Payer: Medicare PPO

## 2020-11-20 VITALS — BP 130/64 | HR 70 | Ht 69.5 in | Wt 197.0 lb

## 2020-11-20 DIAGNOSIS — R0609 Other forms of dyspnea: Secondary | ICD-10-CM

## 2020-11-20 DIAGNOSIS — E119 Type 2 diabetes mellitus without complications: Secondary | ICD-10-CM

## 2020-11-20 DIAGNOSIS — R001 Bradycardia, unspecified: Secondary | ICD-10-CM

## 2020-11-20 DIAGNOSIS — R0600 Dyspnea, unspecified: Secondary | ICD-10-CM | POA: Diagnosis not present

## 2020-11-20 DIAGNOSIS — F0280 Dementia in other diseases classified elsewhere without behavioral disturbance: Secondary | ICD-10-CM

## 2020-11-20 DIAGNOSIS — I4510 Unspecified right bundle-branch block: Secondary | ICD-10-CM | POA: Diagnosis not present

## 2020-11-20 DIAGNOSIS — G309 Alzheimer's disease, unspecified: Secondary | ICD-10-CM | POA: Diagnosis not present

## 2020-11-20 NOTE — Addendum Note (Signed)
Addended by: Theresia Majors on: 11/20/2020 03:46 PM   Modules accepted: Orders

## 2020-11-20 NOTE — Progress Notes (Signed)
Enrolled patient for a 14 day Zio XT Monitor to be mailed to patients home  

## 2020-11-20 NOTE — Patient Instructions (Signed)
Medication Instructions:  Your physician recommends that you continue on your current medications as directed. Please refer to the Current Medication list given to you today.  *If you need a refill on your cardiac medications before your next appointment, please call your pharmacy*  Testing/Procedures: Your physician has recommended that you wear an event monitor. Event monitors are medical devices that record the heart's electrical activity. Doctors most often us these monitors to diagnose arrhythmias. Arrhythmias are problems with the speed or rhythm of the heartbeat. The monitor is a small, portable device. You can wear one while you do your normal daily activities. This is usually used to diagnose what is causing palpitations/syncope (passing out).  Follow-Up: At [facility], you and your health needs are our priority.  As part of our continuing mission to provide you with exceptional heart care, we have created designated Provider Care Teams.  These Care Teams include your primary Cardiologist (physician) and Advanced Practice Providers (APPs -  Physician Assistants and Nurse Practitioners) who all work together to provide you with the care you need, when you need it.  Your next appointment:   1 year(s)  The format for your next appointment:   In Person  Provider:   You may see Traci Turner, MD or one of the following Advanced Practice Providers on your designated Care Team:    Dayna Dunn, PA-C  Michele Lenze, PA-C  Other Instructions ZIO XT- Long Term Monitor Instructions   Your physician has requested you wear your ZIO patch monitor_14_days.   This is a single patch monitor.  [employer] supplies one patch monitor per enrollment.  Additional stickers are not available.   Please do not apply patch if you will be having a Nuclear Stress Test, Echocardiogram, Cardiac CT, MRI, or Chest Xray during the time frame you would be wearing the monitor. The patch cannot be worn during these  tests.  You cannot remove and re-apply the ZIO XT patch monitor.   Your ZIO patch monitor will be sent USPS Priority mail from [employer] directly to your home address. The monitor may also be mailed to a PO BOX if home delivery is not available.   It may take 3-5 days to receive your monitor after you have been enrolled.   Once you have received you monitor, please review enclosed instructions.  Your monitor has already been registered assigning a specific monitor serial # to you.   Applying the monitor   Shave hair from upper left chest.   Hold abrader disc by orange tab.  Rub abrader in 40 strokes over left upper chest as indicated in your monitor instructions.   Clean area with 4 enclosed alcohol pads .  Use all pads to assure are is cleaned thoroughly.  Let dry.   Apply patch as indicated in monitor instructions.  Patch will be place under collarbone on left side of chest with arrow pointing upward.   Rub patch adhesive wings for 2 minutes.Remove white label marked "1".  Remove white label marked "2".  Rub patch adhesive wings for 2 additional minutes.   While looking in a mirror, press and release button in center of patch.  A small green light will flash 3-4 times .  This will be your only indicator the monitor has been turned on.     Do not shower for the first 24 hours.  You may shower after the first 24 hours.   Press button if you feel a symptom. You will hear a   small click.  Record Date, Time and Symptom in the Patient Log Book.   When you are ready to remove patch, follow instructions on last 2 pages of Patient Log Book.  Stick patch monitor onto last page of Patient Log Book.   Place Patient Log Book in Blue box.  Use locking tab on box and tape box closed securely.  The Orange and White box has prepaid postage on it.  Please place in mailbox as soon as possible.  Your physician should have your test results approximately 7 days after the monitor has been mailed back  to [employer].   Call [employer] Customer Care at [phone] if you have questions regarding your ZIO XT patch monitor.  Call them immediately if you see an orange light blinking on your monitor.   If your monitor falls off in less than 4 days contact our Monitor department at [phone].  If your monitor becomes loose or falls off after 4 days call [employer] at [phone] for suggestions on securing your monitor.    

## 2020-11-20 NOTE — Progress Notes (Signed)
Cardiology  Note    Date:  11/20/2020   ID:  [name] [name], DOB 12-Sep-1929, MRN [id number]  PCP:  [name], MD  Cardiologist:  Armanda Magic, MD   Chief Complaint  Patient presents with  . Shortness of Breath    History of Present Illness:  [name] [name] is a 87 y.o. male  with a hx of CVA, Alzheimer's dementia, DM, HLD, OSA and was referred for SOB last May 2021.  A 2D echo was essentially normal with G1DD and patient and wife declined ischemic workup as they did not want to have any invasive workup done if stress test was abnormal.  He had a new RBBB at that time as well.   He is here today for followup and is doing well.  He denies any chest pain or pressure, PND, orthopnea,  dizziness, palpitations or syncope. He denies any DOE but his wife says that she thinks he has SOB and he tires easily.  His wife says that his HR has been as low as 31bpm when he first got out of bed to get dressed. He gets very tired just walking from one room to the other.  He has chronic Le edema which is stable.  He is compliant with his meds and is tolerating meds with no SE.    Past Medical History:  Diagnosis Date  . Acute CVA (cerebrovascular accident) (HCC) 04/05/2017  . Acute metabolic encephalopathy 04/04/2017  . Anxiety   . CAP (community acquired pneumonia) 04/04/2017  . Change in bowel habits 11/06/2016  . Dementia (HCC)   . Diabetes mellitus   . Diabetes mellitus (HCC) 04/04/2017  . Essential tremor 08/20/2015  . Hyperlipidemia   . Late onset Alzheimer's disease without behavioral disturbance (HCC) 10/13/2018  . MCI (mild cognitive impairment) with memory loss 08/20/2015  . Memory loss 02/13/2015  . Obesity   . OBSTRUCTIVE SLEEP APNEA 07/19/2008   Qualifier: Diagnosis of  By: Thad Ranger LPN, [name]    . Osteoarthritis of knee 10/01/2017  . PNA (pneumonia)   . Sleep apnea    uses CPAP at night to sleep  . 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 Stroke (cerebrum) (HCC)   . Stroke (HCC)    . Tremor, essential 10/13/2018  . Umbilical hernia without obstruction and without gangrene 11/06/2016  . Vision loss, central, left 10/13/2018    Past Surgical History:  Procedure Laterality Date  . cipap    . HERNIA REPAIR Right   . REPLACEMENT TOTAL KNEE Left    Dr Bethann Goo / Dr Fannie Knee  . TRANSURETHRAL RESECTION OF PROSTATE      Current Medications: Current Meds  Medication Sig  . ACCU-CHEK SMARTVIEW test strip USE ONCE A DAY AS DIRECTED  . aspirin EC 325 MG EC tablet Take 1 tablet (325 mg total) by mouth daily.  . B-D ULTRAFINE III SHORT PEN 31G X 8 MM MISC   . Cholecalciferol (VITAMIN D-3 PO) Take 1,000 Units by mouth daily.   . citalopram (CELEXA) 20 MG tablet TAKE 1 TABLET (20 MG TOTAL) BY MOUTH DAILY.  Marland Kitchen doxazosin (CARDURA) 4 MG tablet Take 4 mg by mouth daily.  . folic acid (FOLVITE) 800 MCG tablet Take 800 mcg by mouth daily.  Marland Kitchen guaiFENesin-dextromethorphan (ROBITUSSIN DM) 100-10 MG/5ML syrup Take 5 mLs by mouth every 4 (four) hours as needed for cough.  . insulin glargine (LANTUS) 100 UNIT/ML injection Inject 14 Units into the skin daily after supper.  . loratadine-pseudoephedrine (CLARITIN-D 24-HOUR) 10-240 MG  per 24 hr tablet Take 1 tablet by mouth daily as needed for allergies.  . metFORMIN (GLUCOPHAGE) 1000 MG tablet Take 1,000 mg by mouth 2 (two) times daily with a meal.  . methotrexate 2.5 MG tablet Take 12.5 mg by mouth See admin instructions. Every Thursday  . Multiple Vitamin (MULTIVITAMIN WITH MINERALS) TABS Take 1 tablet by mouth daily.  Marland Kitchen omeprazole (PRILOSEC) 40 MG capsule Take 40 mg by mouth daily.  . simvastatin (ZOCOR) 40 MG tablet Take 40 mg by mouth daily.    Allergies:   Donepezil, Lisinopril, Namenda [memantine hcl], and Robaxin [methocarbamol]   Social History   Socioeconomic History  . Marital status: Married    Spouse name: [name]  . Number of children: 2  . Years of education: 12th  . Highest education level: Not on file  Occupational History   . Occupation: retired  Tobacco Use  . Smoking status: Never Smoker  . Smokeless tobacco: Never Used  Vaping Use  . Vaping Use: Never used  Substance and Sexual Activity  . Alcohol use: No  . Drug use: No  . Sexual activity: Not on file  Other Topics Concern  . Not on file  Social History Narrative   Patient lives at home and drinks caffinated  Drinks daily. Married, 2 kids.  Caffeine 2-3 cups daily.    Hs Graduate.  Retired.     Social Determinants of Health   Financial Resource Strain: Not on file  Food Insecurity: Not on file  Transportation Needs: Not on file  Physical Activity: Not on file  Stress: Not on file  Social Connections: Not on file     Family History:  The patient's family history includes Cancer - Lung in his father; Liver disease in his mother.   ROS:   Please see the history of present illness.    ROS All other systems reviewed and are negative.  No flowsheet data found.     PHYSICAL EXAM:   VS:  BP 130/64   Pulse 70   Ht 5' 9.5" (1.765 m)   Wt 197 lb (89.4 kg)   SpO2 98%   BMI 28.67 kg/m    GEN: Well nourished, well developed in no acute distress HEENT: Normal NECK: No JVD; No carotid bruits LYMPHATICS: No lymphadenopathy CARDIAC:RRR, no murmurs, rubs, gallops RESPIRATORY:  Clear to auscultation without rales, wheezing or rhonchi  ABDOMEN: Soft, non-tender, non-distended MUSCULOSKELETAL:  No edema; No deformity  SKIN: Warm and dry NEUROLOGIC:  Alert and oriented x 3 PSYCHIATRIC:  Normal affect    Wt Readings from Last 3 Encounters:  11/20/20 197 lb (89.4 kg)  06/06/20 200 lb (90.7 kg)  02/02/20 186 lb (84.4 kg)      Studies/Labs Reviewed:   EKG:  EKG is ordered today.  The ekg ordered today demonstrates NSR with PACs and RBBB  2D echo 02/2020 IMPRESSIONS   1. Left ventricular ejection fraction, by estimation, is 55 to 60%. The  left ventricle has normal function. The left ventricle has no regional  wall motion  abnormalities. Left ventricular diastolic parameters are  consistent with Grade I diastolic  dysfunction (impaired relaxation).  2. Right ventricular systolic function is normal. The right ventricular  size is normal. There is normal pulmonary artery systolic pressure. The  estimated right ventricular systolic pressure is 33.7 mmHg.  3. The mitral valve is grossly normal. Trivial mitral valve  regurgitation.  4. The aortic valve is tricuspid. Aortic valve regurgitation is trivial.  Mild to  moderate aortic valve sclerosis/calcification is present, without  any evidence of aortic stenosis.  5. The inferior vena cava is normal in size with greater than 50%  respiratory variability, suggesting right atrial pressure of 3 mmHg.   Comparison(s): No significant change from prior study. 04/05/17 EF 60-65%.  PA pressure .    Recent Labs: 02/02/2020: BUN 22; Creatinine, Ser 1.28; NT-Pro BNP 104; Potassium 4.9; Sodium 139; TSH 2.130   Lipid Panel    Component Value Date/Time   CHOL 128 04/05/2017 0551   TRIG 99 04/05/2017 0551   HDL 51 04/05/2017 0551   CHOLHDL 2.5 04/05/2017 0551   VLDL 20 04/05/2017 0551   LDLCALC 57 04/05/2017 0551    Additional studies/ records that were reviewed today include:  OV notes from PCP ASSESSMENT:    1. DOE (dyspnea on exertion)   2. Alzheimer's dementia without behavioral disturbance, unspecified timing of dementia onset (HCC)   3. DM type 2, goal HbA1c < 7% (HCC)   4. RBBB   5. Bradycardia      PLAN:  In order of problems listed above:  1. DOE -2D echo 02/2020 showed normal LVF and RVF with no signfiicant valvular HD and G1DD -at last OV it was recommended that due to his advanced dementia, would not recommend any invasive workup for ischemia and wife was in agreement -the patient feels his DOE is not a problem but his wife says that he gets worn out easily and she thinks he gets short winded  2.  Alzheimer's dementia -this is  advanced -he is followed by PCP and Neuro  3.  DM type 2 -followed by PCP -Continue Metformin and Insulin  4.  RBBB -tnoted at OV in 01/2020 - patient and wife do not want any invasive testing done so would not pursue nuclear stress testing especially since he has no CP;   5.  Bradycardia -his wife says that his HR drops into the 30's when he firsts gets up but he says he feels fine -I will get a 2 week Ziopatch to assess further    Medication Adjustments/Labs and Tests Ordered: Current medicines are reviewed at length with the patient today.  Concerns regarding medicines are outlined above.  Medication changes, Labs and Tests ordered today are listed in the Patient Instructions below.  There are no Patient Instructions on file for this visit.   Signed, Armanda Magic, MD  11/20/2020 3:39 PM    [facility] [address] [place], [place], [place]  [address] Phone: [phone]; Fax: [phone]

## 2020-11-24 DIAGNOSIS — R001 Bradycardia, unspecified: Secondary | ICD-10-CM | POA: Diagnosis not present

## 2020-11-28 ENCOUNTER — Other Ambulatory Visit: Payer: Self-pay

## 2020-11-28 ENCOUNTER — Encounter: Payer: Self-pay | Admitting: Podiatry

## 2020-11-28 ENCOUNTER — Ambulatory Visit: Payer: Medicare PPO | Admitting: Podiatry

## 2020-11-28 DIAGNOSIS — E119 Type 2 diabetes mellitus without complications: Secondary | ICD-10-CM | POA: Diagnosis not present

## 2020-11-28 DIAGNOSIS — M79675 Pain in left toe(s): Secondary | ICD-10-CM | POA: Diagnosis not present

## 2020-11-28 DIAGNOSIS — L408 Other psoriasis: Secondary | ICD-10-CM | POA: Insufficient documentation

## 2020-11-28 DIAGNOSIS — E11649 Type 2 diabetes mellitus with hypoglycemia without coma: Secondary | ICD-10-CM | POA: Insufficient documentation

## 2020-11-28 DIAGNOSIS — I129 Hypertensive chronic kidney disease with stage 1 through stage 4 chronic kidney disease, or unspecified chronic kidney disease: Secondary | ICD-10-CM | POA: Insufficient documentation

## 2020-11-28 DIAGNOSIS — M79674 Pain in right toe(s): Secondary | ICD-10-CM | POA: Diagnosis not present

## 2020-11-28 DIAGNOSIS — R809 Proteinuria, unspecified: Secondary | ICD-10-CM | POA: Insufficient documentation

## 2020-11-28 DIAGNOSIS — I498 Other specified cardiac arrhythmias: Secondary | ICD-10-CM | POA: Insufficient documentation

## 2020-11-28 DIAGNOSIS — D631 Anemia in chronic kidney disease: Secondary | ICD-10-CM | POA: Insufficient documentation

## 2020-11-28 DIAGNOSIS — G4700 Insomnia, unspecified: Secondary | ICD-10-CM | POA: Insufficient documentation

## 2020-11-28 DIAGNOSIS — B351 Tinea unguium: Secondary | ICD-10-CM

## 2020-11-28 DIAGNOSIS — L409 Psoriasis, unspecified: Secondary | ICD-10-CM | POA: Insufficient documentation

## 2020-11-28 DIAGNOSIS — I872 Venous insufficiency (chronic) (peripheral): Secondary | ICD-10-CM | POA: Insufficient documentation

## 2020-11-28 DIAGNOSIS — N400 Enlarged prostate without lower urinary tract symptoms: Secondary | ICD-10-CM | POA: Insufficient documentation

## 2020-11-28 DIAGNOSIS — Z794 Long term (current) use of insulin: Secondary | ICD-10-CM

## 2020-11-28 DIAGNOSIS — N189 Chronic kidney disease, unspecified: Secondary | ICD-10-CM | POA: Insufficient documentation

## 2020-11-28 DIAGNOSIS — I6930 Unspecified sequelae of cerebral infarction: Secondary | ICD-10-CM | POA: Insufficient documentation

## 2020-11-28 DIAGNOSIS — L84 Corns and callosities: Secondary | ICD-10-CM

## 2020-11-28 DIAGNOSIS — K219 Gastro-esophageal reflux disease without esophagitis: Secondary | ICD-10-CM | POA: Insufficient documentation

## 2020-11-28 DIAGNOSIS — H47019 Ischemic optic neuropathy, unspecified eye: Secondary | ICD-10-CM | POA: Insufficient documentation

## 2020-11-28 DIAGNOSIS — H31019 Macula scars of posterior pole (postinflammatory) (post-traumatic), unspecified eye: Secondary | ICD-10-CM | POA: Insufficient documentation

## 2020-11-28 DIAGNOSIS — I6990 Unspecified sequelae of unspecified cerebrovascular disease: Secondary | ICD-10-CM | POA: Insufficient documentation

## 2020-11-28 DIAGNOSIS — I10 Essential (primary) hypertension: Secondary | ICD-10-CM | POA: Insufficient documentation

## 2020-11-28 DIAGNOSIS — J329 Chronic sinusitis, unspecified: Secondary | ICD-10-CM | POA: Insufficient documentation

## 2020-11-28 NOTE — Patient Instructions (Signed)
Diabetes Mellitus and Foot Care Foot care is an important part of your health, especially when you have diabetes. Diabetes may cause you to have problems because of poor blood flow (circulation) to your feet and legs, which can cause your skin to:  Become thinner and drier.  Break more easily.  Heal more slowly.  Peel and crack. You may also have nerve damage (neuropathy) in your legs and feet, causing decreased feeling in them. This means that you may not notice minor injuries to your feet that could lead to more serious problems. Noticing and addressing any potential problems early is the best way to prevent future foot problems. How to care for your feet Foot hygiene  Wash your feet daily with warm water and mild soap. Do not use hot water. Then, pat your feet and the areas between your toes until they are completely dry. Do not soak your feet as this can dry your skin.  Trim your toenails straight across. Do not dig under them or around the cuticle. File the edges of your nails with an emery board or nail file.  Apply a moisturizing lotion or petroleum jelly to the skin on your feet and to dry, brittle toenails. Use lotion that does not contain alcohol and is unscented. Do not apply lotion between your toes.   Shoes and socks  Wear clean socks or stockings every day. Make sure they are not too tight. Do not wear knee-high stockings since they may decrease blood flow to your legs.  Wear shoes that fit properly and have enough cushioning. Always look in your shoes before you put them on to be sure there are no objects inside.  To break in new shoes, wear them for just a few hours a day. This prevents injuries on your feet. Wounds, scrapes, corns, and calluses  Check your feet daily for blisters, cuts, bruises, sores, and redness. If you cannot see the bottom of your feet, use a mirror or ask someone for help.  Do not cut corns or calluses or try to remove them with medicine.  If you  find a minor scrape, cut, or break in the skin on your feet, keep it and the skin around it clean and dry. You may clean these areas with mild soap and water. Do not clean the area with peroxide, alcohol, or iodine.  If you have a wound, scrape, corn, or callus on your foot, look at it several times a day to make sure it is healing and not infected. Check for: ? Redness, swelling, or pain. ? Fluid or blood. ? Warmth. ? Pus or a bad smell.   General tips  Do not cross your legs. This may decrease blood flow to your feet.  Do not use heating pads or hot water bottles on your feet. They may burn your skin. If you have lost feeling in your feet or legs, you may not know this is happening until it is too late.  Protect your feet from hot and cold by wearing shoes, such as at the beach or on hot pavement.  Schedule a complete foot exam at least once a year (annually) or more often if you have foot problems. Report any cuts, sores, or bruises to your health care provider immediately. Where to find more information  American Diabetes Association: www.diabetes.org  Association of Diabetes Care & Education Specialists: www.diabeteseducator.org Contact a health care provider if:  You have a medical condition that increases your risk of infection and   you have any cuts, sores, or bruises on your feet.  You have an injury that is not healing.  You have redness on your legs or feet.  You feel burning or tingling in your legs or feet.  You have pain or cramps in your legs and feet.  Your legs or feet are numb.  Your feet always feel cold.  You have pain around any toenails. Get help right away if:  You have a wound, scrape, corn, or callus on your foot and: ? You have pain, swelling, or redness that gets worse. ? You have fluid or blood coming from the wound, scrape, corn, or callus. ? Your wound, scrape, corn, or callus feels warm to the touch. ? You have pus or a bad smell coming from  the wound, scrape, corn, or callus. ? You have a fever. ? You have a red line going up your leg. Summary  Check your feet every day for blisters, cuts, bruises, sores, and redness.  Apply a moisturizing lotion or petroleum jelly to the skin on your feet and to dry, brittle toenails.  Wear shoes that fit properly and have enough cushioning.  If you have foot problems, report any cuts, sores, or bruises to your health care provider immediately.  Schedule a complete foot exam at least once a year (annually) or more often if you have foot problems. This information is not intended to replace advice given to you by your health care provider. Make sure you discuss any questions you have with your health care provider. Document Revised: 03/22/2020 Document Reviewed: 03/22/2020 Elsevier Patient Education  2021 [employer] Inc.  

## 2020-11-28 NOTE — Progress Notes (Signed)
ANNUAL DIABETIC FOOT EXAM  Subjective: [name] [name] presents today for for annual diabetic foot examination and callus(es) left foot and painful thick toenails that are difficult to trim. Painful toenails interfere with ambulation. Aggravating factors include wearing enclosed shoe gear. Pain is relieved with periodic professional debridement. Painful calluses are aggravated when weightbearing with and without shoegear. Pain is relieved with periodic professional debridement..  Patient relates 20 year h/o diabetes.  Patient denies any  h/o foot wounds.  Patient denies symptoms of foot numbness.  Patient denies symptoms of foot tingling.  Patient denies symptoms of burning in feet.  Patient denies symptoms of pins/needles in feet.  Patient's blood sugar was 90 mg/dl in the office via his Dex Com monitor.  [name], MD is patient's PCP. Last visit was 11/15/2020.   Allergies  Allergen Reactions  . Donepezil Shortness Of Breath and Swelling  . Lisinopril Anaphylaxis, Shortness Of Breath and Swelling    HAD TO COME TO THE E.D. AFTER TAKING THIS!!  . [name] Hcl] Shortness Of Breath and Swelling  . Robaxin [Methocarbamol] Anaphylaxis, Shortness Of Breath and Swelling    HAD TO COME TO THE E.D. AFTER TAKING THIS!!  . Other     Other reaction(s): shortness of breath   Review of Systems: Negative except as noted in the HPI.  Objective: There were no vitals filed for this visit.  [name] [name] is a pleasant 87 y.o. male in NAD. AAO X 3.  Vascular Examination: Capillary refill time to digits immediate b/l. Palpable pedal pulses b/l LE. Pedal hair absent. Lower extremity skin temperature gradient within normal limits. No pain with calf compression b/l. Trace edema noted b/l lower extremities.  Dermatological Examination: Pedal skin with normal turgor, texture and tone bilaterally. No open wounds bilaterally. No interdigital macerations bilaterally.  Toenails 1-5 b/l elongated, discolored, dystrophic, thickened, crumbly with subungual debris and tenderness to dorsal palpation. Hyperkeratotic lesion(s) 1st metatarsal head left foot.  No erythema, no edema, no drainage, no fluctuance.  Musculoskeletal Examination: Normal muscle strength 5/5 to all lower extremity muscle groups bilaterally. No pain crepitus or joint limitation noted with ROM b/l. No gross bony deformities bilaterally. Utilizes cane for ambulation assistance.  Footwear Assessment: Does the patient wear appropriate shoes? Yes. Does the patient need inserts/orthotics? No.  Neurological Examination: Protective sensation intact 5/5 intact bilaterally with 10g monofilament b/l.  Assessment: 1. Pain due to onychomycosis of toenails of both feet   2. Callus   3. Type 2 diabetes mellitus without complication, with long-term current use of insulin (HCC)   4. Encounter for diabetic foot exam (HCC)     ADA Risk Categorization: Low Risk :  Patient has all of the following: Intact protective sensation No prior foot ulcer  No severe deformity Pedal pulses present  Plan: -Examined patient. -Diabetic foot examination performed on today's visit. -Patient to continue soft, supportive shoe gear daily. -Toenails 1-5 b/l were debrided in length and girth with sterile nail nippers and dremel without iatrogenic bleeding.  -Callus(es) 1st metatarsal head left foot pared utilizing sterile scalpel blade without complication or incident. Total number debrided =1. -Patient to report any pedal injuries to medical professional immediately. -Patient/POA to call should there be question/concern in the interim.  Return in about 3 months (around 02/28/2021).  Freddie Breech, DPM

## 2020-12-18 ENCOUNTER — Telehealth: Payer: Self-pay

## 2020-12-18 DIAGNOSIS — R001 Bradycardia, unspecified: Secondary | ICD-10-CM

## 2020-12-18 DIAGNOSIS — R0602 Shortness of breath: Secondary | ICD-10-CM

## 2020-12-18 NOTE — Telephone Encounter (Signed)
-----   Message from Quintella Reichert, MD sent at 12/18/2020  3:35 PM EDT ----- Please repeat limited echo to make sure that LVF is still normal ----- Message ----- From: Quintella Reichert, MD Sent: 12/18/2020   3:31 PM EDT To: Quintella Reichert, MD

## 2020-12-18 NOTE — Telephone Encounter (Signed)
[name] Reichert, MD  12/18/2020 3:35 PM EDT Back to Top     Heart monitor showed occasional extra heart beats from the top of the heart and bottom of heart which are benign. His average HR is 70bpm and I suspect that his low HRs at home are nonperfused PVCs. Please find out if he feels any palpitations or dizziness. Given his advanced age and dementia>> would not pursue ischemic workup if he is asymptomatic   Patient's wife has been notified of results and verbalized understanding. Echocardiogram has been scheduled.

## 2020-12-25 DIAGNOSIS — G4733 Obstructive sleep apnea (adult) (pediatric): Secondary | ICD-10-CM | POA: Diagnosis not present

## 2020-12-26 ENCOUNTER — Ambulatory Visit: Payer: Medicare PPO | Admitting: Podiatry

## 2021-01-14 DIAGNOSIS — G4733 Obstructive sleep apnea (adult) (pediatric): Secondary | ICD-10-CM | POA: Diagnosis not present

## 2021-01-16 ENCOUNTER — Telehealth (HOSPITAL_COMMUNITY): Payer: Self-pay | Admitting: Cardiology

## 2021-01-16 NOTE — Telephone Encounter (Signed)
Patients spouse called and cancelled echocardiogram that was scheduled for reason below:   01/16/2021 10:47 AM DZ:HGDJM, Montel Clock E  Cancel Rsn: Patient (patient's wife states the patient is physically unable to come in at this time/will call back to reschedule)  Order will be removed from the ECHO WQ and when they call back to reschedule we will reinstate the order. Thank you

## 2021-01-20 ENCOUNTER — Encounter (HOSPITAL_BASED_OUTPATIENT_CLINIC_OR_DEPARTMENT_OTHER): Payer: Self-pay

## 2021-01-20 ENCOUNTER — Emergency Department (HOSPITAL_BASED_OUTPATIENT_CLINIC_OR_DEPARTMENT_OTHER)
Admission: EM | Admit: 2021-01-20 | Discharge: 2021-01-20 | Disposition: A | Discharge status: Home / Self Care | Attending: Emergency Medicine | Admitting: Emergency Medicine

## 2021-01-20 ENCOUNTER — Other Ambulatory Visit: Payer: Self-pay

## 2021-01-20 DIAGNOSIS — E119 Type 2 diabetes mellitus without complications: Secondary | ICD-10-CM | POA: Insufficient documentation

## 2021-01-20 DIAGNOSIS — Z7982 Long term (current) use of aspirin: Secondary | ICD-10-CM | POA: Diagnosis not present

## 2021-01-20 DIAGNOSIS — R509 Fever, unspecified: Secondary | ICD-10-CM | POA: Diagnosis not present

## 2021-01-20 DIAGNOSIS — R531 Weakness: Secondary | ICD-10-CM | POA: Diagnosis not present

## 2021-01-20 DIAGNOSIS — G309 Alzheimer's disease, unspecified: Secondary | ICD-10-CM | POA: Diagnosis not present

## 2021-01-20 DIAGNOSIS — R82998 Other abnormal findings in urine: Secondary | ICD-10-CM | POA: Diagnosis not present

## 2021-01-20 DIAGNOSIS — Z794 Long term (current) use of insulin: Secondary | ICD-10-CM | POA: Insufficient documentation

## 2021-01-20 DIAGNOSIS — Z7984 Long term (current) use of oral hypoglycemic drugs: Secondary | ICD-10-CM | POA: Insufficient documentation

## 2021-01-20 LAB — CBC WITH DIFFERENTIAL/PLATELET
Abs Immature Granulocytes: 0.01 10*3/uL (ref 0.00–0.07)
Basophils Absolute: 0 10*3/uL (ref 0.0–0.1)
Basophils Relative: 1 %
Eosinophils Absolute: 0.1 10*3/uL (ref 0.0–0.5)
Eosinophils Relative: 1 %
HCT: 35.7 % — ABNORMAL LOW (ref 39.0–52.0)
Hemoglobin: 11.6 g/dL — ABNORMAL LOW (ref 13.0–17.0)
Immature Granulocytes: 0 %
Lymphocytes Relative: 63 %
Lymphs Abs: 3.2 10*3/uL (ref 0.7–4.0)
MCH: 33.2 pg (ref 26.0–34.0)
MCHC: 32.5 g/dL (ref 30.0–36.0)
MCV: 102.3 fL — ABNORMAL HIGH (ref 80.0–100.0)
Monocytes Absolute: 1.4 10*3/uL — ABNORMAL HIGH (ref 0.1–1.0)
Monocytes Relative: 26 %
Neutro Abs: 0.5 10*3/uL — ABNORMAL LOW (ref 1.7–7.7)
Neutrophils Relative %: 9 %
Platelets: 270 10*3/uL (ref 150–400)
RBC: 3.49 MIL/uL — ABNORMAL LOW (ref 4.22–5.81)
RDW: 14.2 % (ref 11.5–15.5)
WBC: 5.1 10*3/uL (ref 4.0–10.5)
nRBC: 0 % (ref 0.0–0.2)

## 2021-01-20 LAB — COMPREHENSIVE METABOLIC PANEL
ALT: 17 U/L (ref 0–44)
AST: 13 U/L — ABNORMAL LOW (ref 15–41)
Albumin: 3.9 g/dL (ref 3.5–5.0)
Alkaline Phosphatase: 49 U/L (ref 38–126)
Anion gap: 11 (ref 5–15)
BUN: 34 mg/dL — ABNORMAL HIGH (ref 8–23)
CO2: 21 mmol/L — ABNORMAL LOW (ref 22–32)
Calcium: 9.6 mg/dL (ref 8.9–10.3)
Chloride: 106 mmol/L (ref 98–111)
Creatinine, Ser: 1.15 mg/dL (ref 0.61–1.24)
GFR, Estimated: >60 mL/min (ref >60)
Glucose, Bld: 169 mg/dL — ABNORMAL HIGH (ref 70–99)
Potassium: 4.5 mmol/L (ref 3.5–5.1)
Sodium: 138 mmol/L (ref 135–145)
Total Bilirubin: 0.3 mg/dL (ref 0.3–1.2)
Total Protein: 7 g/dL (ref 6.5–8.1)

## 2021-01-20 LAB — URINALYSIS, ROUTINE W REFLEX MICROSCOPIC
Bilirubin Urine: NEGATIVE
Glucose, UA: NEGATIVE mg/dL
Hgb urine dipstick: NEGATIVE
Ketones, ur: NEGATIVE mg/dL
Leukocytes,Ua: NEGATIVE
Nitrite: NEGATIVE
Specific Gravity, Urine: 1.026 (ref 1.005–1.030)
pH: 5 (ref 5.0–8.0)

## 2021-01-20 MED ORDER — SODIUM CHLORIDE 0.9 % IV BOLUS
1000.0000 mL | Freq: Once | INTRAVENOUS | Status: AC
  Administered 2021-01-20: 1000 mL via INTRAVENOUS

## 2021-01-20 NOTE — ED Triage Notes (Addendum)
Pt to Ed from home with complaints of lethargy and generalized weakness. Pt wife reports that he was running a low grade temp at home and his urine is slightly darker than normal. Pt wife also reports that pt is a new hospice patient without terminal diagnosis.

## 2021-01-20 NOTE — ED Provider Notes (Signed)
[facility] EMERGENCY DEPARTMENT Provider Note  CSN: [id number] Arrival date & time: 01/20/21 1856    History Chief Complaint  Patient presents with  . Fatigue    HPI  [name] [name] is a 87 y.o. male with history of dementia brought by his wife for evaluation of decreased energy today. He sleeps a lot at baseline but today has been less active, tactile fever at home, less intake than usual and a dark urine. EMS came to the house and recommended the family bring the patient to the ED for evaluation. Patient himself denies any complaints but does not provide any additional details. Wife has not noticed a facial droop, arm or leg weakness or change in his mental status.   Past Medical History:  Diagnosis Date  . Acute CVA (cerebrovascular accident) (HCC) 04/05/2017  . Acute metabolic encephalopathy 04/04/2017  . Anxiety   . CAP (community acquired pneumonia) 04/04/2017  . Change in bowel habits 11/06/2016  . Dementia (HCC)   . Diabetes mellitus   . Diabetes mellitus (HCC) 04/04/2017  . Essential tremor 08/20/2015  . Hyperlipidemia   . Late onset Alzheimer's disease without behavioral disturbance (HCC) 10/13/2018  . MCI (mild cognitive impairment) with memory loss 08/20/2015  . Memory loss 02/13/2015  . Obesity   . OBSTRUCTIVE SLEEP APNEA 07/19/2008   Qualifier: Diagnosis of  By: Thad Ranger LPN, [name]    . Osteoarthritis of knee 10/01/2017  . PNA (pneumonia)   . Sleep apnea    uses CPAP at night to sleep  . 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 Stroke (cerebrum) (HCC)   . Stroke (HCC)   . Tremor, essential 10/13/2018  . Umbilical hernia without obstruction and without gangrene 11/06/2016  . Vision loss, central, left 10/13/2018    Past Surgical History:  Procedure Laterality Date  . cipap    . HERNIA REPAIR Right   . REPLACEMENT TOTAL KNEE Left    Dr Bethann Goo / Dr Fannie Knee  . TRANSURETHRAL RESECTION OF PROSTATE      Family History  Problem Relation Age of Onset  . Liver  disease Mother   . Cancer - Lung Father        smoker    Social History   Tobacco Use  . Smoking status: Never Smoker  . Smokeless tobacco: Never Used  Vaping Use  . Vaping Use: Never used  Substance Use Topics  . Alcohol use: No  . Drug use: No     Home Medications Prior to Admission medications   Medication Sig Start Date End Date Taking? Authorizing Provider  ACCU-CHEK SMARTVIEW test strip USE ONCE A DAY AS DIRECTED 02/17/18   [provider]  aspirin EC 325 MG EC tablet Take 1 tablet (325 mg total) by mouth daily. 04/06/17   Rai, Delene Ruffini, MD  B-D ULTRAFINE III SHORT PEN 31G X 8 MM MISC  12/28/14   [provider]  Cholecalciferol (VITAMIN D-3 PO) Take 1,000 Units by mouth daily.     [provider]  citalopram (CELEXA) 20 MG tablet TAKE 1 TABLET (20 MG TOTAL) BY MOUTH DAILY. 08/04/16   Dohmeier, Porfirio Mylar, MD  doxazosin (CARDURA) 4 MG tablet Take 4 mg by mouth daily.    [provider]  folic acid (FOLVITE) 800 MCG tablet Take 800 mcg by mouth daily.    [provider]  guaiFENesin-dextromethorphan (ROBITUSSIN DM) 100-10 MG/5ML syrup Take 5 mLs by mouth every 4 (four) hours as needed for cough. 04/06/17   Rai, Ripudeep  K, MD  insulin glargine (LANTUS) 100 UNIT/ML injection Inject 14 Units into the skin daily after supper.    [provider]  loratadine-pseudoephedrine (CLARITIN-D 24-HOUR) 10-240 MG per 24 hr tablet Take 1 tablet by mouth daily as needed for allergies.    [provider]  metFORMIN (GLUCOPHAGE) 1000 MG tablet Take 1,000 mg by mouth 2 (two) times daily with a meal.    [provider]  methotrexate 2.5 MG tablet Take 12.5 mg by mouth See admin instructions. Every Thursday    [provider]  Multiple Vitamin (MULTIVITAMIN WITH MINERALS) TABS Take 1 tablet by mouth daily.    [provider]  omeprazole (PRILOSEC) 40 MG capsule Take 40 mg by mouth daily. 12/06/14   [provider]  simvastatin (ZOCOR) 40 MG tablet Take 40 mg by mouth daily. 08/03/15   [provider]     Allergies    Donepezil, Lisinopril, Namenda [memantine hcl], Robaxin [methocarbamol], and Other   Review of Systems   Review of Systems Unable to assess due to mental status.    Physical Exam BP 121/73   Pulse 83   Temp 99.9 F (37.7 C) (Rectal)   Resp 20   Ht 5\' 9"  (1.753 m)   Wt 85.6 kg   SpO2 95%   BMI 27.87 kg/m   Physical Exam Vitals and nursing note reviewed.  Constitutional:      Appearance: Normal appearance.  HENT:     Head: Normocephalic and atraumatic.     Nose: Nose normal.     Mouth/Throat:     Mouth: Mucous membranes are dry.  Eyes:     Extraocular Movements: Extraocular movements intact.     Conjunctiva/sclera: Conjunctivae normal.  Cardiovascular:     Rate and Rhythm: Normal rate.  Pulmonary:     Effort: Pulmonary effort is normal.     Breath sounds: Normal breath sounds.  Abdominal:     General: Abdomen is flat.     Palpations: Abdomen is soft.     Tenderness: There is no abdominal tenderness.  Musculoskeletal:        General: No swelling. Normal range of motion.     Cervical back: Neck supple.  Skin:    General: Skin is warm and dry.  Neurological:     General: No focal deficit present.     Mental Status: He is alert. He is disoriented.     Cranial Nerves: No cranial nerve deficit.     Motor: No weakness.  Psychiatric:        Mood and Affect: Mood normal.      ED Results / Procedures / Treatments   Labs (all labs ordered are listed, but only abnormal results are displayed) Labs Reviewed  COMPREHENSIVE METABOLIC PANEL - Abnormal; Notable for the following components:      Result Value   CO2 21 (*)    Glucose, Bld 169 (*)    BUN 34 (*)    AST 13 (*)    All other components within normal limits  CBC WITH DIFFERENTIAL/PLATELET - Abnormal; Notable for the following components:   RBC 3.49 (*)    Hemoglobin 11.6 (*)     HCT 35.7 (*)    MCV 102.3 (*)    Neutro Abs 0.5 (*)    Monocytes Absolute 1.4 (*)    All other components within normal limits  URINALYSIS, ROUTINE W REFLEX MICROSCOPIC - Abnormal; Notable for the following components:   Protein, ur TRACE (*)  All other components within normal limits  URINE CULTURE  PATHOLOGIST SMEAR REVIEW    EKG EKG Interpretation  Date/Time:  Sunday Jan 20 2021 19:16:51 EDT Ventricular Rate:  71 PR Interval:  204 QRS Duration: 152 QT Interval:  394 QTC Calculation: 429 R Axis:   59 Text Interpretation: Sinus arrhythmia Right bundle branch block Since last tracing RBBB now present Confirmed by Susy Frizzle [phone]) on 01/20/2021 8:32:39 PM    Radiology No results found.  Procedures Procedures  Medications Ordered in the ED Medications  sodium chloride 0.9 % bolus 1,000 mL (0 mLs Intravenous Stopped 01/20/21 2200)     MDM Rules/Calculators/A&P MDM Patient with decreased energy, possible fever at home. Will check labs, including CBC, CMP and UA. Give IVF and reassess.  ED Course  I have reviewed the triage vital signs and the nursing notes.  Pertinent labs & imaging results that were available during my care of the patient were reviewed by me and considered in my medical decision making (see chart for details).  Clinical Course as of 01/20/21 2258  Sun Jan 20, 2021  2031 CMP with mild hyperglycemia, otherwise unremarkable.  [CS]  2051 CBC with mild anemia, otherwise normal.  [CS]  2256 UA is negative for signs of infection. Patient's wife is reassured and would like to take him home. She will follow up with PCP if not improving in the next few days. Encouraged to return for any other concerns.  [CS]    Clinical Course User Index [CS] Pollyann Savoy, MD    Final Clinical Impression(s) / ED Diagnoses Final diagnoses:  Generalized weakness    Rx / DC Orders ED Discharge Orders    None       Pollyann Savoy, MD 01/20/21  2258

## 2021-01-22 ENCOUNTER — Other Ambulatory Visit (HOSPITAL_COMMUNITY): Payer: Medicare PPO

## 2021-01-22 LAB — URINE CULTURE: Culture: NO GROWTH

## 2021-01-22 LAB — PATHOLOGIST SMEAR REVIEW

## 2021-01-24 DIAGNOSIS — G4733 Obstructive sleep apnea (adult) (pediatric): Secondary | ICD-10-CM | POA: Diagnosis not present

## 2021-02-24 DIAGNOSIS — G4733 Obstructive sleep apnea (adult) (pediatric): Secondary | ICD-10-CM | POA: Diagnosis not present

## 2021-03-12 ENCOUNTER — Encounter: Payer: Self-pay | Admitting: Podiatry

## 2021-03-12 ENCOUNTER — Other Ambulatory Visit: Payer: Self-pay

## 2021-03-12 ENCOUNTER — Ambulatory Visit: Payer: Medicare PPO | Admitting: Podiatry

## 2021-03-12 DIAGNOSIS — M79675 Pain in left toe(s): Secondary | ICD-10-CM | POA: Diagnosis not present

## 2021-03-12 DIAGNOSIS — B351 Tinea unguium: Secondary | ICD-10-CM | POA: Diagnosis not present

## 2021-03-12 DIAGNOSIS — Z794 Long term (current) use of insulin: Secondary | ICD-10-CM | POA: Diagnosis not present

## 2021-03-12 DIAGNOSIS — E119 Type 2 diabetes mellitus without complications: Secondary | ICD-10-CM

## 2021-03-12 DIAGNOSIS — M79674 Pain in right toe(s): Secondary | ICD-10-CM

## 2021-03-12 DIAGNOSIS — L84 Corns and callosities: Secondary | ICD-10-CM

## 2021-03-14 NOTE — Progress Notes (Signed)
  Subjective:  Patient ID: [name] [name], male    DOB: 1928/12/09,  MRN: [id number]  87 y.o. male presents with preventative diabetic foot care and painful thick toenails that are difficult to trim. Pain interferes with ambulation. Aggravating factors include wearing enclosed shoe gear. Pain is relieved with periodic professional debridement.  His wife is present on today's visit. They voice no new pedal concerns on today's visit.  Patient's blood sugar was 136 mg/dl at lunchtime today.  PCP: [name], MD and last visit was: February 2022 per patient.  Review of Systems: Negative except as noted in the HPI.   Allergies  Allergen Reactions   Donepezil Shortness Of Breath and Swelling    Other reaction(s): shortness of breath   Lisinopril Anaphylaxis, Shortness Of Breath and Swelling    HAD TO COME TO THE E.D. AFTER TAKING THIS!! Other reaction(s): angioedema   Methocarbamol Anaphylaxis, Shortness Of Breath and Swelling    HAD TO COME TO THE E.D. AFTER TAKING THIS!! Other reaction(s): OTHER REACTION, swelling   Namenda [Memantine Hcl] Shortness Of Breath and Swelling   Other     Other reaction(s): shortness of breath    Objective:  There were no vitals filed for this visit. Constitutional Patient is a pleasant 87 y.o. Caucasian male in NAD. AAO x 3.  Vascular Capillary refill time to digits immediate b/l. Palpable pedal pulses b/l LE. Pedal hair absent. Lower extremity skin temperature gradient within normal limits. No pain with calf compression b/l. Trace edema noted b/l lower extremities. No cyanosis or clubbing noted.  Neurologic Normal speech. Protective sensation intact 5/5 intact bilaterally with 10g monofilament b/l. Vibratory sensation intact b/l.  Dermatologic Pedal skin with normal turgor, texture and tone b/l lower extremities No open wounds b/l lower extremities No interdigital macerations b/l lower extremities Toenails 1-5 b/l elongated, discolored, dystrophic,  thickened, crumbly with subungual debris and tenderness to dorsal palpation. Hyperkeratotic lesion(s) 1st metatarsal head left foot.  No erythema, no edema, no drainage, no fluctuance.  Orthopedic: Normal muscle strength 5/5 to all lower extremity muscle groups bilaterally. No pain crepitus or joint limitation noted with ROM b/l. No gross bony deformities bilaterally. Utilizes cane for ambulation assistance.    Assessment:   1. Pain due to onychomycosis of toenails of both feet   2. Callus   3. Type 2 diabetes mellitus without complication, with long-term current use of insulin (HCC)    Plan:  Patient was evaluated and treated and all questions answered.  Onychomycosis with pain -Nails palliatively debridement as below. -Educated on self-care  Procedure: Nail Debridement Rationale: Pain Type of Debridement: manual, sharp debridement. Instrumentation: Nail nipper, rotary burr. Number of Nails: 10  -Examined patient. -Continue diabetic foot care principles. -Patient to continue soft, supportive shoe gear daily. -Toenails 1-5 b/l were debrided in length and girth with sterile nail nippers and dremel without iatrogenic bleeding.  -Callus(es) 1st metatarsal head left foot pared utilizing sterile scalpel blade without complication or incident. Total number debrided =1. -Patient to report any pedal injuries to medical professional immediately. -Patient/POA to call should there be question/concern in the interim.  Return in about 3 months (around 06/12/2021).  Freddie Breech, DPM

## 2021-03-25 DIAGNOSIS — G4733 Obstructive sleep apnea (adult) (pediatric): Secondary | ICD-10-CM | POA: Diagnosis not present

## 2021-03-25 DIAGNOSIS — L400 Psoriasis vulgaris: Secondary | ICD-10-CM | POA: Diagnosis not present

## 2021-04-25 DIAGNOSIS — G4733 Obstructive sleep apnea (adult) (pediatric): Secondary | ICD-10-CM | POA: Diagnosis not present

## 2021-05-17 DIAGNOSIS — E785 Hyperlipidemia, unspecified: Secondary | ICD-10-CM | POA: Diagnosis not present

## 2021-05-17 DIAGNOSIS — Z794 Long term (current) use of insulin: Secondary | ICD-10-CM | POA: Diagnosis not present

## 2021-05-17 DIAGNOSIS — K219 Gastro-esophageal reflux disease without esophagitis: Secondary | ICD-10-CM | POA: Diagnosis not present

## 2021-05-17 DIAGNOSIS — G4733 Obstructive sleep apnea (adult) (pediatric): Secondary | ICD-10-CM | POA: Diagnosis not present

## 2021-05-17 DIAGNOSIS — F419 Anxiety disorder, unspecified: Secondary | ICD-10-CM | POA: Diagnosis not present

## 2021-05-17 DIAGNOSIS — L409 Psoriasis, unspecified: Secondary | ICD-10-CM | POA: Diagnosis not present

## 2021-05-17 DIAGNOSIS — E119 Type 2 diabetes mellitus without complications: Secondary | ICD-10-CM | POA: Diagnosis not present

## 2021-05-17 DIAGNOSIS — I10 Essential (primary) hypertension: Secondary | ICD-10-CM | POA: Diagnosis not present

## 2021-05-17 DIAGNOSIS — K08109 Complete loss of teeth, unspecified cause, unspecified class: Secondary | ICD-10-CM | POA: Diagnosis not present

## 2021-05-26 DIAGNOSIS — G4733 Obstructive sleep apnea (adult) (pediatric): Secondary | ICD-10-CM | POA: Diagnosis not present

## 2021-06-12 ENCOUNTER — Ambulatory Visit: Payer: Medicare PPO | Admitting: Adult Health

## 2021-06-18 ENCOUNTER — Other Ambulatory Visit: Payer: Self-pay

## 2021-06-18 ENCOUNTER — Encounter: Payer: Self-pay | Admitting: Podiatry

## 2021-06-18 ENCOUNTER — Ambulatory Visit: Payer: Medicare PPO | Admitting: Podiatry

## 2021-06-18 DIAGNOSIS — Z794 Long term (current) use of insulin: Secondary | ICD-10-CM

## 2021-06-18 DIAGNOSIS — M79674 Pain in right toe(s): Secondary | ICD-10-CM

## 2021-06-18 DIAGNOSIS — L84 Corns and callosities: Secondary | ICD-10-CM | POA: Diagnosis not present

## 2021-06-18 DIAGNOSIS — E119 Type 2 diabetes mellitus without complications: Secondary | ICD-10-CM | POA: Diagnosis not present

## 2021-06-18 DIAGNOSIS — B351 Tinea unguium: Secondary | ICD-10-CM

## 2021-06-18 DIAGNOSIS — M79675 Pain in left toe(s): Secondary | ICD-10-CM | POA: Diagnosis not present

## 2021-06-23 NOTE — Progress Notes (Signed)
  Subjective:  Patient ID: [name] [name], male    DOB: Jan 22, 1929,  MRN: [id number]  87 y.o. male presents preventative diabetic foot care and callus(es) left foot and painful thick toenails that are difficult to trim. Painful toenails interfere with ambulation. Aggravating factors include wearing enclosed shoe gear. Pain is relieved with periodic professional debridement. Painful calluses are aggravated when weightbearing with and without shoegear. Pain is relieved with periodic professional debridement.  Patient states blood glucose was 122 mg/dl today.    His wife and daughter are present during today's visit.  He voices no new pedal problems on today's visit.  PCP is [name], MD , and last visit was 11/06/2020.  Allergies  Allergen Reactions   Donepezil Shortness Of Breath and Swelling    Other reaction(s): shortness of breath   Lisinopril Anaphylaxis, Shortness Of Breath and Swelling    HAD TO COME TO THE E.D. AFTER TAKING THIS!! Other reaction(s): angioedema   Methocarbamol Anaphylaxis, Shortness Of Breath and Swelling    HAD TO COME TO THE E.D. AFTER TAKING THIS!! Other reaction(s): OTHER REACTION, swelling   Namenda [Memantine Hcl] Shortness Of Breath and Swelling   Other     Other reaction(s): shortness of breath    Review of Systems: Negative except as noted in the HPI.   Objective:  Vascular Examination: Capillary refill time to digits immediate b/l. Palpable DP pulse(s) b/l lower extremities Palpable PT pulse(s) b/l lower extremities Pedal hair absent. Lower extremity skin temperature gradient within normal limits. No pain with calf compression b/l. Trace edema noted b/l lower extremities.  Neurological Examination: Protective sensation intact 5/5 intact bilaterally with 10g monofilament b/l.  Dermatological Examination: Skin warm and supple b/l lower extremities. No open wounds b/l lower extremities. No interdigital macerations b/l lower extremities.  Toenails 1-5 b/l elongated, discolored, dystrophic, thickened, crumbly with subungual debris and tenderness to dorsal palpation. Hyperkeratotic lesion(s) 1st metatarsal head left foot.  No erythema, no edema, no drainage, no fluctuance.  Musculoskeletal Examination: Normal muscle strength 5/5 to all lower extremity muscle groups bilaterally. No gross bony deformities b/l lower extremities. Utilizes cane for ambulation assistance.  Radiographs: None Assessment:   1. Pain due to onychomycosis of toenails of both feet   2. Callus   3. Type 2 diabetes mellitus without complication, with long-term current use of insulin (HCC)     Plan:  -No new findings. No new orders. -Continue diabetic foot care principles: inspect feet daily, monitor glucose as recommended by PCP and/or Endocrinologist, and follow prescribed diet per PCP, Endocrinologist and/or dietician. -Patient to continue soft, supportive shoe gear daily. -Toenails 1-5 b/l were debrided in length and girth with sterile nail nippers and dremel without iatrogenic bleeding.  -Callus(es) 1st metatarsal head left foot pared utilizing sterile scalpel blade without complication or incident. Total number debrided =1. -Patient to report any pedal injuries to medical professional immediately. -Patient/POA to call should there be question/concern in the interim.  Return in about 3 months (around 09/18/2021).  Freddie Breech, DPM

## 2021-07-03 ENCOUNTER — Telehealth: Payer: Self-pay | Admitting: Pulmonary Disease

## 2021-07-03 NOTE — Telephone Encounter (Signed)
Patient scheduled 07/18/21 with Dr. Wynona Neat for sleep consult. Papers received from [facility] stated Patient currently uses cpap and download was provided at OV. Patient is not showing in airview and no sleep studies found. ATC number listed as Patient contact, but no answer, and no VM.

## 2021-07-18 ENCOUNTER — Ambulatory Visit: Payer: Medicare PPO | Admitting: Pulmonary Disease

## 2021-07-18 ENCOUNTER — Ambulatory Visit (INDEPENDENT_AMBULATORY_CARE_PROVIDER_SITE_OTHER): Payer: Medicare PPO

## 2021-07-18 ENCOUNTER — Encounter: Payer: Self-pay | Admitting: Pulmonary Disease

## 2021-07-18 ENCOUNTER — Other Ambulatory Visit: Payer: Self-pay

## 2021-07-18 VITALS — BP 112/58 | HR 72 | Temp 97.5°F | Ht 69.0 in | Wt 177.7 lb

## 2021-07-18 DIAGNOSIS — R0602 Shortness of breath: Secondary | ICD-10-CM | POA: Diagnosis not present

## 2021-07-18 NOTE — Progress Notes (Signed)
[name] [name]    [id number]    [birth year]  Primary Care Physician:Swayne, Onalee Hua, MD  Referring Physician: [name], MD [phone] [place],  [place] [address]  Chief complaint:   Patient with a longstanding history of sleep apnea  HPI:  Patient with longstanding history of obstructive sleep apnea Machine stopped working about 3 months ago  He has a history of dementia  Family reports oxygen levels drops sometimes during the day  He is not able to ambulate much Ambulates with a walker  They do not want him to have a sleep study done I did let them know that since his study was so many years ago he may need to have one done but we will call [facility] to figure out what needs to be done  Oxygen level in the office today was 96%  He gets short of breath sometimes just sitting around He does have snoring, restless legs Usually goes to bed between 10 and 11 Might take him an hour to fall asleep Several awakenings Final wake up time about 10 AM  He does seem tired all the time He has a history of cardiac rhythm problems, diabetes  He was compliant with his CPAP  Never smoker No pertinent occupational disease  Outpatient Encounter Medications as of 07/18/2021  Medication Sig   ACCU-CHEK SMARTVIEW test strip USE ONCE A DAY AS DIRECTED   aspirin EC 325 MG EC tablet Take 1 tablet (325 mg total) by mouth daily.   B-D ULTRAFINE III SHORT PEN 31G X 8 MM MISC    Cholecalciferol (VITAMIN D-3 PO) Take 1,000 Units by mouth daily.    citalopram (CELEXA) 20 MG tablet TAKE 1 TABLET (20 MG TOTAL) BY MOUTH DAILY.   Continuous Blood Gluc Sensor (FREESTYLE LIBRE 2 SENSOR) MISC See admin instructions.   doxazosin (CARDURA) 4 MG tablet Take 4 mg by mouth daily.   folic acid (FOLVITE) 800 MCG tablet Take 800 mcg by mouth daily.   insulin glargine (LANTUS) 100 UNIT/ML injection Inject 14 Units into the skin daily after supper.    loratadine-pseudoephedrine (CLARITIN-D 24-HOUR) 10-240 MG per 24 hr tablet Take 1 tablet by mouth daily as needed for allergies.   metFORMIN (GLUCOPHAGE) 1000 MG tablet Take 1,000 mg by mouth 2 (two) times daily with a meal.   methotrexate (RHEUMATREX) 2.5 MG tablet Take by mouth.   Multiple Vitamin (MULTIVITAMIN WITH MINERALS) TABS Take 1 tablet by mouth daily.   omeprazole (PRILOSEC) 40 MG capsule Take 40 mg by mouth daily.   simvastatin (ZOCOR) 40 MG tablet Take 40 mg by mouth daily.   guaiFENesin-dextromethorphan (ROBITUSSIN DM) 100-10 MG/5ML syrup Take 5 mLs by mouth every 4 (four) hours as needed for cough. (Patient not taking: Reported on 07/18/2021)   omeprazole (PRILOSEC OTC) 20 MG tablet 1 tablet 30 minutes before morning meal (Patient not taking: Reported on 07/18/2021)   No facility-administered encounter medications on file as of 07/18/2021.    Allergies as of 07/18/2021 - Review Complete 06/18/2021  Allergen Reaction Noted   Donepezil Shortness Of Breath and Swelling 03/27/2012   Lisinopril Anaphylaxis, Shortness Of Breath, and Swelling 07/04/2014   Methocarbamol Anaphylaxis, Shortness Of Breath, and Swelling 03/21/2002   Namenda [memantine hcl] Shortness Of Breath and Swelling 12/09/2013   Other  11/08/2020    Past Medical History:  Diagnosis Date   Acute CVA (cerebrovascular accident) (HCC) 04/05/2017   Acute metabolic encephalopathy  04/04/2017   Anxiety    CAP (community acquired pneumonia) 04/04/2017   Change in bowel habits 11/06/2016   Dementia (HCC)    Diabetes mellitus    Diabetes mellitus (HCC) 04/04/2017   Essential tremor 08/20/2015   Hyperlipidemia    Late onset Alzheimer's disease without behavioral disturbance (HCC) 10/13/2018   MCI (mild cognitive impairment) with memory loss 08/20/2015   Memory loss 02/13/2015   Obesity    OBSTRUCTIVE SLEEP APNEA 07/19/2008   Qualifier: Diagnosis of  By: Thad Ranger LPN, [name]     Osteoarthritis of knee 10/01/2017   PNA  (pneumonia)    Sleep apnea    uses CPAP at night to sleep   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   Stroke (cerebrum) (HCC)    Stroke (HCC)    Tremor, essential 10/13/2018   Umbilical hernia without obstruction and without gangrene 11/06/2016   Vision loss, central, left 10/13/2018    Past Surgical History:  Procedure Laterality Date   cipap     HERNIA REPAIR Right    REPLACEMENT TOTAL KNEE Left    Dr Bethann Goo / Dr Carin Hock RESECTION OF PROSTATE      Family History  Problem Relation Age of Onset   Liver disease Mother    Cancer - Lung Father        smoker    Social History   Socioeconomic History   Marital status: Married    Spouse name: [name]   Number of children: 2   Years of education: 12th   Highest education level: Not on file  Occupational History   Occupation: retired  Tobacco Use   Smoking status: Never   Smokeless tobacco: Never  Vaping Use   Vaping Use: Never used  Substance and Sexual Activity   Alcohol use: No   Drug use: No   Sexual activity: Not on file  Other Topics Concern   Not on file  Social History Narrative   Patient lives at home and drinks caffinated  Drinks daily. Married, 2 kids.  Caffeine 2-3 cups daily.    Hs Graduate.  Retired.     Social Determinants of Health   Financial Resource Strain: Not on file  Food Insecurity: Not on file  Transportation Needs: Not on file  Physical Activity: Not on file  Stress: Not on file  Social Connections: Not on file  Intimate Partner Violence: Not on file    Review of Systems  Constitutional:  Positive for fatigue.  Respiratory:  Positive for shortness of breath.   Psychiatric/Behavioral:  Positive for sleep disturbance.    Vitals:   07/18/21 1432  BP: (!) 112/58  Pulse: 72  Temp: (!) 97.5 F (36.4 C)  SpO2: 96%     Physical Exam Constitutional:      Appearance: Normal appearance.  HENT:     Head: Normocephalic.     Mouth/Throat:     Mouth: Mucous membranes are moist.   Cardiovascular:     Rate and Rhythm: Normal rate and regular rhythm.     Heart sounds: No murmur heard.   No friction rub.  Pulmonary:     Effort: No respiratory distress.     Breath sounds: No stridor. No wheezing or rhonchi.  Musculoskeletal:     Cervical back: No rigidity or tenderness.  Neurological:     Mental Status: He is alert.  Psychiatric:        Mood and Affect: Mood normal.     Data Reviewed: Last recent lab work  on patient was 01/20/2021  Assessment:  History of obstructive sleep apnea  Machine is dysfunctional -He does need a new machine -His last study was over 20 years ago  Shortness of breath on exertion -Likely multifactorial -Likely related to significant deconditioning  -We will contact [facility] to see whether a new study is needed to qualify him for a new machine or a prescription  Plan/Recommendations: Contact [facility] to figure out what is needed with respect to getting a new machine and  Obtain a chest x-ray today  Oximetry today is over 96% -If he still has significant oxygen desaturations at home then we can have the DME company assess for oxygen need with an oximetry  Tentative follow-up in 4 to 6 weeks   Virl Diamond MD [facility] 07/18/2021, 2:55 PM  CC: [name], MD

## 2021-07-18 NOTE — Patient Instructions (Signed)
We will contact [facility] to find out what what is needed -If only if prescription is needed for CPAP we will provide this -He may require a sleep study to qualify for CPAP as his previous study was so many years ago  Regarding oxygen supplementation -We need to be able to show that his oxygen level falls to a certain number usually below 88 to qualify for oxygen -This can be at rest or with ambulation  If he is unable to walk in the office We can ask the medical supply company to check his oxygen at night and also at home-we just need documentation that his oxygen is actually low  I will see him in about 4 to 6 weeks

## 2021-07-19 ENCOUNTER — Telehealth: Payer: Self-pay | Admitting: Pulmonary Disease

## 2021-07-19 NOTE — Telephone Encounter (Signed)
Please call patient to let them know what we found out  They can private pay for CPAP as insurance will likely not pay for it-issued 08/25/2019 We do not have any data to show that he was using it nightly unfortunately  Let them know to contact the medical supply company as the medical supply company has not been able to get in touch with him  I am willing to provide a prescription for CPAP, not sure how it will be acquired

## 2021-07-23 NOTE — Telephone Encounter (Signed)
Spk to wife who states send in new Rx and she will have step daughter go to [facility] store and purchase a new machine. Nothing further

## 2021-08-01 ENCOUNTER — Telehealth: Payer: Self-pay | Admitting: Pulmonary Disease

## 2021-08-01 DIAGNOSIS — G4733 Obstructive sleep apnea (adult) (pediatric): Secondary | ICD-10-CM

## 2021-08-01 NOTE — Telephone Encounter (Signed)
AO please advise of Cpap settings so we can send in order for cpap.   LMTCB with patient/daughter to update.

## 2021-08-06 NOTE — Telephone Encounter (Signed)
Still waiting on response from AO about cpap settings. Per encounter from 11/4, family was going to go to [facility] to purchase a new machine for pt. Now they need to know what settings. Please advise.

## 2021-08-06 NOTE — Telephone Encounter (Signed)
I will call [facility] to check on settings for this patient.

## 2021-08-07 NOTE — Telephone Encounter (Signed)
Called and spoke with Nida Boatman and he stated that he did get Holden's message that she left him yesterday 11/22. Pt has been added to Airview so we can view his data. Per Nida Boatman, pt has not worn the machine in a few months but if needed, we can see his data and are able to see his settings in [place].   Per Airvew, pt's settings are min 12 with max 20. Nothing further needed.

## 2021-08-07 NOTE — Telephone Encounter (Signed)
[name] [name] is returning phone call. [name] [name] phone number is [phone]. 

## 2021-08-09 NOTE — Telephone Encounter (Signed)
CPAP prescription to be sent to patient or [facility]  Patient going to self-pay for machine   Auto CPAP 12-20 is the settings that based on record.

## 2021-08-09 NOTE — Progress Notes (Signed)
Normal chest x-ray.

## 2021-08-09 NOTE — Telephone Encounter (Signed)
Order for CPAP was placed  I called and spoke with Eunice Blase and notified this was done  Nothing further needed

## 2021-08-09 NOTE — Addendum Note (Signed)
Addended by: Christen Butter on: 08/09/2021 10:27 AM   Modules accepted: Orders

## 2021-08-28 ENCOUNTER — Telehealth: Payer: Self-pay | Admitting: Pulmonary Disease

## 2021-08-28 NOTE — Telephone Encounter (Signed)
Called and spoke with Gavin Pound ok per DPR  She states that pt had to cancel his appt tomorrow and she will call back to reschedule this  She asked about results of cxr done here 07/22/21  I advised that this was normal per AO result note  Nothing further needed

## 2021-08-29 ENCOUNTER — Ambulatory Visit: Payer: Medicare PPO | Admitting: Pulmonary Disease

## 2021-09-04 DIAGNOSIS — N1831 Chronic kidney disease, stage 3a: Secondary | ICD-10-CM | POA: Diagnosis not present

## 2021-09-04 DIAGNOSIS — G309 Alzheimer's disease, unspecified: Secondary | ICD-10-CM | POA: Diagnosis not present

## 2021-09-04 DIAGNOSIS — E1122 Type 2 diabetes mellitus with diabetic chronic kidney disease: Secondary | ICD-10-CM | POA: Diagnosis not present

## 2021-09-04 DIAGNOSIS — Z1389 Encounter for screening for other disorder: Secondary | ICD-10-CM | POA: Diagnosis not present

## 2021-09-04 DIAGNOSIS — Z0000 Encounter for general adult medical examination without abnormal findings: Secondary | ICD-10-CM | POA: Diagnosis not present

## 2021-09-04 DIAGNOSIS — E782 Mixed hyperlipidemia: Secondary | ICD-10-CM | POA: Diagnosis not present

## 2021-09-04 DIAGNOSIS — K219 Gastro-esophageal reflux disease without esophagitis: Secondary | ICD-10-CM | POA: Diagnosis not present

## 2021-09-04 DIAGNOSIS — I129 Hypertensive chronic kidney disease with stage 1 through stage 4 chronic kidney disease, or unspecified chronic kidney disease: Secondary | ICD-10-CM | POA: Diagnosis not present

## 2021-09-04 DIAGNOSIS — G4730 Sleep apnea, unspecified: Secondary | ICD-10-CM | POA: Diagnosis not present

## 2021-09-23 DIAGNOSIS — E1122 Type 2 diabetes mellitus with diabetic chronic kidney disease: Secondary | ICD-10-CM | POA: Diagnosis not present

## 2021-09-23 DIAGNOSIS — E782 Mixed hyperlipidemia: Secondary | ICD-10-CM | POA: Diagnosis not present

## 2021-09-27 ENCOUNTER — Ambulatory Visit: Payer: Medicare PPO | Admitting: Podiatry

## 2021-10-01 ENCOUNTER — Other Ambulatory Visit: Payer: Self-pay

## 2021-10-01 ENCOUNTER — Ambulatory Visit: Payer: Medicare PPO | Admitting: Podiatry

## 2021-10-01 ENCOUNTER — Encounter: Payer: Self-pay | Admitting: Podiatry

## 2021-10-01 DIAGNOSIS — M79674 Pain in right toe(s): Secondary | ICD-10-CM | POA: Diagnosis not present

## 2021-10-01 DIAGNOSIS — B351 Tinea unguium: Secondary | ICD-10-CM | POA: Diagnosis not present

## 2021-10-01 DIAGNOSIS — E119 Type 2 diabetes mellitus without complications: Secondary | ICD-10-CM

## 2021-10-01 DIAGNOSIS — L84 Corns and callosities: Secondary | ICD-10-CM | POA: Diagnosis not present

## 2021-10-01 DIAGNOSIS — M79675 Pain in left toe(s): Secondary | ICD-10-CM | POA: Diagnosis not present

## 2021-10-01 DIAGNOSIS — Z794 Long term (current) use of insulin: Secondary | ICD-10-CM

## 2021-10-05 ENCOUNTER — Emergency Department (HOSPITAL_BASED_OUTPATIENT_CLINIC_OR_DEPARTMENT_OTHER): Payer: Medicare PPO

## 2021-10-05 ENCOUNTER — Other Ambulatory Visit: Payer: Self-pay

## 2021-10-05 ENCOUNTER — Encounter (HOSPITAL_BASED_OUTPATIENT_CLINIC_OR_DEPARTMENT_OTHER): Payer: Self-pay

## 2021-10-05 ENCOUNTER — Emergency Department (HOSPITAL_BASED_OUTPATIENT_CLINIC_OR_DEPARTMENT_OTHER)
Admission: EM | Admit: 2021-10-05 | Discharge: 2021-10-05 | Disposition: A | Discharge status: Home / Self Care | Payer: Medicare PPO | Attending: Emergency Medicine | Admitting: Emergency Medicine

## 2021-10-05 DIAGNOSIS — N281 Cyst of kidney, acquired: Secondary | ICD-10-CM | POA: Insufficient documentation

## 2021-10-05 DIAGNOSIS — R7309 Other abnormal glucose: Secondary | ICD-10-CM | POA: Diagnosis not present

## 2021-10-05 DIAGNOSIS — Z7982 Long term (current) use of aspirin: Secondary | ICD-10-CM | POA: Diagnosis not present

## 2021-10-05 DIAGNOSIS — K429 Umbilical hernia without obstruction or gangrene: Secondary | ICD-10-CM | POA: Diagnosis not present

## 2021-10-05 DIAGNOSIS — R001 Bradycardia, unspecified: Secondary | ICD-10-CM | POA: Diagnosis not present

## 2021-10-05 DIAGNOSIS — F0390 Unspecified dementia without behavioral disturbance: Secondary | ICD-10-CM | POA: Insufficient documentation

## 2021-10-05 DIAGNOSIS — N189 Chronic kidney disease, unspecified: Secondary | ICD-10-CM | POA: Insufficient documentation

## 2021-10-05 DIAGNOSIS — R339 Retention of urine, unspecified: Secondary | ICD-10-CM | POA: Diagnosis not present

## 2021-10-05 LAB — CBC
HCT: 34.3 % — ABNORMAL LOW (ref 39.0–52.0)
Hemoglobin: 11.2 g/dL — ABNORMAL LOW (ref 13.0–17.0)
MCH: 33.2 pg (ref 26.0–34.0)
MCHC: 32.7 g/dL (ref 30.0–36.0)
MCV: 101.8 fL — ABNORMAL HIGH (ref 80.0–100.0)
Platelets: 220 10*3/uL (ref 150–400)
RBC: 3.37 MIL/uL — ABNORMAL LOW (ref 4.22–5.81)
RDW: 13.7 % (ref 11.5–15.5)
WBC: 8.1 10*3/uL (ref 4.0–10.5)
nRBC: 0 % (ref 0.0–0.2)

## 2021-10-05 LAB — URINALYSIS, ROUTINE W REFLEX MICROSCOPIC
Bilirubin Urine: NEGATIVE
Glucose, UA: NEGATIVE mg/dL
Hgb urine dipstick: NEGATIVE
Ketones, ur: NEGATIVE mg/dL
Leukocytes,Ua: NEGATIVE
Nitrite: NEGATIVE
Protein, ur: NEGATIVE mg/dL
Specific Gravity, Urine: 1.009 (ref 1.005–1.030)
pH: 5 (ref 5.0–8.0)

## 2021-10-05 LAB — BASIC METABOLIC PANEL
Anion gap: 11 (ref 5–15)
BUN: 25 mg/dL — ABNORMAL HIGH (ref 8–23)
CO2: 23 mmol/L (ref 22–32)
Calcium: 9 mg/dL (ref 8.9–10.3)
Chloride: 101 mmol/L (ref 98–111)
Creatinine, Ser: 1.18 mg/dL (ref 0.61–1.24)
GFR, Estimated: 59 mL/min — ABNORMAL LOW (ref >60)
Glucose, Bld: 166 mg/dL — ABNORMAL HIGH (ref 70–99)
Potassium: 4.5 mmol/L (ref 3.5–5.1)
Sodium: 135 mmol/L (ref 135–145)

## 2021-10-05 LAB — CBG MONITORING, ED
Glucose-Capillary: 102 mg/dL — ABNORMAL HIGH (ref 70–99)
Glucose-Capillary: 83 mg/dL (ref 70–99)

## 2021-10-05 NOTE — ED Notes (Signed)
Patient states unable to urinate to give UA.  Bladder scan done.  Patient denies pain or discomfort.

## 2021-10-05 NOTE — ED Provider Notes (Signed)
[facility] EMERGENCY DEPT Provider Note   CSN: [id number] Arrival date & time: 10/05/21  1436     History  Chief Complaint  Patient presents with   Urinary Retention    [name] [name] is a 88 y.o. male.  HPI  Patient is 38-year-old male with history of dementia presenting with urinary retention.  He has not urinated in 36 hours per wife.  Yesterday morning he had an episode of urinary incontinence which is never happened before.  After that he was unable to void.  Denies any pain anywhere, not having any fever, nausea, vomiting at home.  Home Medications Prior to Admission medications   Medication Sig Start Date End Date Taking? Authorizing Provider  ACCU-CHEK SMARTVIEW test strip USE ONCE A DAY AS DIRECTED 02/17/18   [provider]  aspirin EC 325 MG EC tablet Take 1 tablet (325 mg total) by mouth daily. 04/06/17   Rai, Delene Ruffini, MD  B-D ULTRAFINE III SHORT PEN 31G X 8 MM MISC  12/28/14   [provider]  Cholecalciferol (VITAMIN D-3 PO) Take 1,000 Units by mouth daily.     [provider]  citalopram (CELEXA) 20 MG tablet TAKE 1 TABLET (20 MG TOTAL) BY MOUTH DAILY. 08/04/16   Dohmeier, Porfirio Mylar, MD  Continuous Blood Gluc Sensor (FREESTYLE LIBRE 2 SENSOR) MISC See admin instructions. 01/06/21   [provider]  doxazosin (CARDURA) 4 MG tablet Take 4 mg by mouth daily.    [provider]  folic acid (FOLVITE) 800 MCG tablet Take 800 mcg by mouth daily.    [provider]  guaiFENesin-dextromethorphan (ROBITUSSIN DM) 100-10 MG/5ML syrup Take 5 mLs by mouth every 4 (four) hours as needed for cough. Patient not taking: Reported on 07/18/2021 04/06/17   Rai, Delene Ruffini, MD  insulin glargine (LANTUS) 100 UNIT/ML injection Inject 14 Units into the skin daily after supper.    [provider]  loratadine-pseudoephedrine (CLARITIN-D 24-HOUR) 10-240 MG per 24 hr tablet Take 1 tablet by mouth daily as needed for  allergies.    [provider]  metFORMIN (GLUCOPHAGE) 1000 MG tablet Take 1,000 mg by mouth 2 (two) times daily with a meal.    [provider]  methotrexate (RHEUMATREX) 2.5 MG tablet Take by mouth. 12/18/11   [provider]  Multiple Vitamin (MULTIVITAMIN WITH MINERALS) TABS Take 1 tablet by mouth daily.    [provider]  omeprazole (PRILOSEC OTC) 20 MG tablet 1 tablet 30 minutes before morning meal Patient not taking: Reported on 07/18/2021 12/04/20   [provider]  omeprazole (PRILOSEC) 40 MG capsule Take 40 mg by mouth daily. 12/06/14   [provider]  simvastatin (ZOCOR) 40 MG tablet Take 40 mg by mouth daily. 08/03/15   [provider]      Allergies    Donepezil, Lisinopril, Methocarbamol, Namenda [memantine hcl], and Other    Review of Systems   Review of Systems  Genitourinary:  Positive for difficulty urinating.   Physical Exam Updated Vital Signs BP 133/88 (BP Location: Left Arm)    Pulse 79    Temp 98.2 F (36.8 C) (Oral)    Resp 20    Ht 5\' 9"  (1.753 m)    Wt 80.6 kg    SpO2 92%    BMI 26.24 kg/m  Physical Exam Vitals and nursing note reviewed. Exam conducted with a chaperone present.  Constitutional:      Appearance: Normal appearance.  HENT:  Head: Normocephalic and atraumatic.  Eyes:     General: No scleral icterus.       Right eye: No discharge.        Left eye: No discharge.     Extraocular Movements: Extraocular movements intact.     Pupils: Pupils are equal, round, and reactive to light.  Cardiovascular:     Rate and Rhythm: Regular rhythm. Bradycardia present.     Pulses: Normal pulses.     Heart sounds: Normal heart sounds. No murmur heard.   No friction rub. No gallop.  Pulmonary:     Effort: Pulmonary effort is normal. No respiratory distress.     Breath sounds: Normal breath sounds.  Abdominal:     General: Abdomen is flat. Bowel sounds are normal. There is no distension.      Palpations: Abdomen is soft.     Tenderness: There is no abdominal tenderness.     Hernia: A hernia is present.     Comments: Abdomen is soft, feels somewhat distended.  Soft, reproducible umbilical hernia.  Skin:    General: Skin is warm and dry.     Coloration: Skin is not jaundiced.  Neurological:     Mental Status: He is alert. Mental status is at baseline.   ED Results / Procedures / Treatments   Labs (all labs ordered are listed, but only abnormal results are displayed) Labs Reviewed  BASIC METABOLIC PANEL - Abnormal; Notable for the following components:      Result Value   Glucose, Bld 166 (*)    BUN 25 (*)    GFR, Estimated 59 (*)    All other components within normal limits  CBC - Abnormal; Notable for the following components:   RBC 3.37 (*)    Hemoglobin 11.2 (*)    HCT 34.3 (*)    MCV 101.8 (*)    All other components within normal limits  CBG MONITORING, ED - Abnormal; Notable for the following components:   Glucose-Capillary 102 (*)    All other components within normal limits  URINE CULTURE  URINALYSIS, ROUTINE W REFLEX MICROSCOPIC  CBG MONITORING, ED    EKG None  Radiology US Renal  Result Date: 10/05/2021 CLINICAL DATA:  Urinary retention. EXAM: RENAL / URINARY TRACT ULTRASOUND COMPLETE COMPARISON:  Renal ultrasound dated 07/08/2005. FINDINGS: Right Kidney: Renal measurements: 11.5 x 4.7 x 4.6 cm = volume: 130 mL. There is mild parenchyma atrophy and increased echogenicity. No hydronephrosis or shadowing stone. There is a 15 mm interpolar cyst with a thin septation. Left Kidney: Renal measurements: 11.4 x 5.5 x 4.7 cm = volume: 155 mL. Moderate parenchyma atrophy and increased echogenicity. No hydronephrosis or shadowing stone. Bladder: Appears normal for degree of bladder distention. Bilateral ureteral jets noted. Other: None. IMPRESSION: 1. Mildly atrophic and echogenic kidneys in keeping with chronic kidney disease. No hydronephrosis or shadowing stone. 2.  Small minimally complex right renal interpolar cyst. Electronically Signed   By: Elgie CollardArash  Radparvar M.D.   On: 10/05/2021 19:33    Procedures Procedures    Medications Ordered in ED Medications - No data to display  ED Course/ Medical Decision Making/ A&P                           Medical Decision Making Amount and/or Complexity of Data Reviewed Independent Historian: spouse    Details: Wife at bedside External Data Reviewed: labs, radiology and notes. Labs: ordered. Decision-making details documented in ED  Course. Radiology: ordered. Decision-making details documented in ED Course.  Risk OTC drugs. Decision regarding hospitalization.   This is a 88 year old man presenting with urinary retention.  Vitals are stable, he is not febrile or septic appearing.  No history of urinary retention, also no injury to the spine concerning for an acute back trauma.  Wife is at bedside providing history given patient's dementia, level 5 caveat applied.  Abdominal exam benign, some distention consistent with urinary retention.  We will proceed with labs to evaluate kidney function, UA, renal ultrasound.   Additional history obtained: -Additional history obtained from patient's past -External records from outside source obtained and reviewed including: Chart review including previous notes, labs, imaging, consultation notes   Lab Tests: -I ordered, reviewed, and interpreted labs.  The pertinent results include: BMP does not show AKI.  Standard GFR, CBC without any leukocytosis concerning for an acute infectious process.  Patient is not anemic.  UA without any evidence of UTI.   Imaging Studies ordered: -I ordered imaging studies including renal ultrasound -I independently visualized and interpreted imaging which showed no obstructive renal process to explain his new urinary retention.  Only 250 mL of urine visualized on the exam.  Catheter was placed given 36 hours without ability to urinate  which really 750 mL of urine. -I agree with the radiologist interpretation   Medicines ordered and prescription drug management: -I ordered medication including Foley catheter for urinary retention -Reevaluation of the patient after these medicines showed that the patient resolved -I have reviewed the patients home medicines and have made adjustments as needed   Foley catheter put in place, clear stream of urine.  Patient does not have any findings concerning for an acute obstructive renal process.  Additionally no infectious process indicated by leukocytosis, UTI, or septic vital signs.  No evidence of impaired renal function, no AKI.  Patient symptoms improved with the Foley, we will have him follow-up with urology this week.  At this time feel patient is safe and appropriate for discharge home.  Considered hospitalization but given patient is stable with home health coming 3 times weekly and family support do not think that is necessary.   Reevaluation: After the interventions noted above, I reevaluated the patient and found that they have :improved   Dispostion: Discharge home with urology follow-up   Discussed HPI, physical exam and plan of care for this patient with attending Coralee Pesa. The attending physician evaluated this patient as part of a shared visit and agrees with plan of care.         Final Clinical Impression(s) / ED Diagnoses Final diagnoses:  Urinary retention    Rx / DC Orders ED Discharge Orders     None         Theron Arista, PA-C 10/05/21 2251    Rozelle Logan, DO 10/06/21 1533

## 2021-10-05 NOTE — ED Notes (Signed)
°  Foley converted to a leg bag.  Patient and family educated on emptying bag and securing bad.  Family and patient verbalized understanding.

## 2021-10-05 NOTE — ED Triage Notes (Signed)
Patient here POV from Home with Urinary Retention.  Patient states he has not urinated in 24 hours. Patient does not have severe urge to Urinate. No Fevers. No Pain.  Patient does endorse Normal Kidney Function at PCP from Blood Specimens obtained 09/23/21.  NAD Noted during Triage. A&Ox4. GCS 15. BIB Wheelchair.

## 2021-10-05 NOTE — Discharge Instructions (Addendum)
You have been seen and discharged from the emergency department.  You were found to have urinary retention, a Foley was placed for this.  No signs of kidney dysfunction or infection in the urine/body.  Schedule appointment and follow-up with urology for further evaluation and treatment.  Keep the Foley in place until this follow-up appointment.  Follow-up with your primary provider for further evaluation and further care. Take home medications as prescribed. If you have any worsening symptoms or further concerns for your health please return to an emergency department for further evaluation.

## 2021-10-06 NOTE — Progress Notes (Signed)
°  Subjective:  Patient ID: [name] [name], male    DOB: 04-05-1929,  MRN: [id number]  [name] [name] presents to clinic today for preventative diabetic foot care and callus(es) left foot and painful thick toenails that are difficult to trim. Painful toenails interfere with ambulation. Aggravating factors include wearing enclosed shoe gear. Pain is relieved with periodic professional debridement. Painful calluses are aggravated when weightbearing with and without shoegear. Pain is relieved with periodic professional debridement.  Patient states blood glucose was 107 mg/dl today.    He is accompanied by his wife on today's visit. They voice no new pedal   PCP is [name], MD , and last visit was two weeks ago.  Allergies  Allergen Reactions   Donepezil Shortness Of Breath, Swelling and Other (See Comments)    Other reaction(s): shortness of breath   Lisinopril Anaphylaxis, Shortness Of Breath and Swelling    HAD TO COME TO THE E.D. AFTER TAKING THIS!! Other reaction(s): angioedema   Methocarbamol Anaphylaxis, Shortness Of Breath and Swelling    HAD TO COME TO THE E.D. AFTER TAKING THIS!! Other reaction(s): OTHER REACTION, swelling   Namenda [Memantine Hcl] Shortness Of Breath and Swelling   Other     Other reaction(s): shortness of breath    Review of Systems: Negative except as noted in the HPI. Objective:   Constitutional Reynoldo Mainer is a pleasant 88 y.o. Caucasian male, WD, WN in NAD. AAO x 3.   Vascular CFT immediate b/l LE. Palpable DP/PT pulses b/l LE. Digital hair absent b/l. Skin temperature gradient WNL b/l. No pain with calf compression b/l. Trace edema noted b/l. No cyanosis or clubbing noted b/l LE.  Neurologic Normal speech. Oriented to person, place, and time. Protective sensation intact 5/5 intact bilaterally with 10g monofilament b/l. Vibratory sensation intact b/l.  Dermatologic Pedal integument with normal turgor, texture and tone b/l LE. No  open wounds b/l. No interdigital macerations b/l. Toenails 1-5 b/l elongated, thickened, discolored with subungual debris. +Tenderness with dorsal palpation of nailplates. Hyperkeratotic lesion(s) noted 1st metatarsal head left lower extremity.  Orthopedic: Muscle strength 5/5 to all lower extremity muscle groups bilaterally. No gross bony deformities bilaterally. Utilizes cane for ambulation assistance.   Radiographs: None   Assessment:   1. Pain due to onychomycosis of toenails of both feet   2. Callus   3. Type 2 diabetes mellitus without complication, with long-term current use of insulin (HCC)    Plan:  Patient was evaluated and treated and all questions answered. Consent given for treatment as described below: -Continue foot and shoe inspections daily. Monitor blood glucose per PCP/Endocrinologist's recommendations. -Mycotic toenails 1-5 bilaterally were debrided in length and girth with sterile nail nippers and dremel without incident. -Callus(es) 1st metatarsal head left lower extremity pared utilizing sterile scalpel blade without complication or incident. Total number debrided =1. -Patient/POA to call should there be question/concern in the interim.  Return in about 3 months (around 12/30/2021).  Freddie Breech, DPM

## 2021-10-07 LAB — URINE CULTURE: Culture: NO GROWTH

## 2021-10-09 ENCOUNTER — Emergency Department (HOSPITAL_COMMUNITY)
Admission: EM | Admit: 2021-10-09 | Discharge: 2021-10-09 | Disposition: A | Discharge status: Home / Self Care | Payer: Medicare PPO | Attending: Student | Admitting: Student

## 2021-10-09 ENCOUNTER — Other Ambulatory Visit: Payer: Self-pay

## 2021-10-09 ENCOUNTER — Emergency Department (HOSPITAL_COMMUNITY): Payer: Medicare PPO

## 2021-10-09 ENCOUNTER — Encounter (HOSPITAL_COMMUNITY): Payer: Self-pay

## 2021-10-09 DIAGNOSIS — F0280 Dementia in other diseases classified elsewhere without behavioral disturbance: Secondary | ICD-10-CM | POA: Diagnosis not present

## 2021-10-09 DIAGNOSIS — G309 Alzheimer's disease, unspecified: Secondary | ICD-10-CM | POA: Insufficient documentation

## 2021-10-09 DIAGNOSIS — E1140 Type 2 diabetes mellitus with diabetic neuropathy, unspecified: Secondary | ICD-10-CM | POA: Insufficient documentation

## 2021-10-09 DIAGNOSIS — Z96652 Presence of left artificial knee joint: Secondary | ICD-10-CM | POA: Diagnosis not present

## 2021-10-09 DIAGNOSIS — Z7982 Long term (current) use of aspirin: Secondary | ICD-10-CM | POA: Diagnosis not present

## 2021-10-09 DIAGNOSIS — I129 Hypertensive chronic kidney disease with stage 1 through stage 4 chronic kidney disease, or unspecified chronic kidney disease: Secondary | ICD-10-CM | POA: Diagnosis not present

## 2021-10-09 DIAGNOSIS — E1122 Type 2 diabetes mellitus with diabetic chronic kidney disease: Secondary | ICD-10-CM | POA: Diagnosis not present

## 2021-10-09 DIAGNOSIS — Z8673 Personal history of transient ischemic attack (TIA), and cerebral infarction without residual deficits: Secondary | ICD-10-CM | POA: Insufficient documentation

## 2021-10-09 DIAGNOSIS — N189 Chronic kidney disease, unspecified: Secondary | ICD-10-CM | POA: Insufficient documentation

## 2021-10-09 DIAGNOSIS — R410 Disorientation, unspecified: Secondary | ICD-10-CM | POA: Diagnosis not present

## 2021-10-09 DIAGNOSIS — E11649 Type 2 diabetes mellitus with hypoglycemia without coma: Secondary | ICD-10-CM | POA: Diagnosis not present

## 2021-10-09 DIAGNOSIS — Z794 Long term (current) use of insulin: Secondary | ICD-10-CM | POA: Insufficient documentation

## 2021-10-09 DIAGNOSIS — R531 Weakness: Secondary | ICD-10-CM | POA: Insufficient documentation

## 2021-10-09 DIAGNOSIS — I6381 Other cerebral infarction due to occlusion or stenosis of small artery: Secondary | ICD-10-CM | POA: Diagnosis not present

## 2021-10-09 DIAGNOSIS — Z7984 Long term (current) use of oral hypoglycemic drugs: Secondary | ICD-10-CM | POA: Diagnosis not present

## 2021-10-09 DIAGNOSIS — R001 Bradycardia, unspecified: Secondary | ICD-10-CM | POA: Diagnosis not present

## 2021-10-09 DIAGNOSIS — R338 Other retention of urine: Secondary | ICD-10-CM | POA: Diagnosis not present

## 2021-10-09 DIAGNOSIS — I959 Hypotension, unspecified: Secondary | ICD-10-CM | POA: Diagnosis not present

## 2021-10-09 DIAGNOSIS — N401 Enlarged prostate with lower urinary tract symptoms: Secondary | ICD-10-CM | POA: Diagnosis not present

## 2021-10-09 DIAGNOSIS — Z79899 Other long term (current) drug therapy: Secondary | ICD-10-CM | POA: Insufficient documentation

## 2021-10-09 LAB — URINALYSIS, ROUTINE W REFLEX MICROSCOPIC
Bilirubin Urine: NEGATIVE
Glucose, UA: NEGATIVE mg/dL
Hgb urine dipstick: NEGATIVE
Ketones, ur: NEGATIVE mg/dL
Nitrite: NEGATIVE
Protein, ur: NEGATIVE mg/dL
Specific Gravity, Urine: 1.018 (ref 1.005–1.030)
pH: 5 (ref 5.0–8.0)

## 2021-10-09 LAB — COMPREHENSIVE METABOLIC PANEL
ALT: 11 U/L (ref 0–44)
AST: 15 U/L (ref 15–41)
Albumin: 3.2 g/dL — ABNORMAL LOW (ref 3.5–5.0)
Alkaline Phosphatase: 44 U/L (ref 38–126)
Anion gap: 11 (ref 5–15)
BUN: 23 mg/dL (ref 8–23)
CO2: 24 mmol/L (ref 22–32)
Calcium: 8.9 mg/dL (ref 8.9–10.3)
Chloride: 103 mmol/L (ref 98–111)
Creatinine, Ser: 1.17 mg/dL (ref 0.61–1.24)
GFR, Estimated: 59 mL/min — ABNORMAL LOW (ref >60)
Glucose, Bld: 117 mg/dL — ABNORMAL HIGH (ref 70–99)
Potassium: 3.9 mmol/L (ref 3.5–5.1)
Sodium: 138 mmol/L (ref 135–145)
Total Bilirubin: 0.4 mg/dL (ref 0.3–1.2)
Total Protein: 5.9 g/dL — ABNORMAL LOW (ref 6.5–8.1)

## 2021-10-09 LAB — CBC WITH DIFFERENTIAL/PLATELET
Abs Immature Granulocytes: 0.03 10*3/uL (ref 0.00–0.07)
Basophils Absolute: 0 10*3/uL (ref 0.0–0.1)
Basophils Relative: 1 %
Eosinophils Absolute: 0.3 10*3/uL (ref 0.0–0.5)
Eosinophils Relative: 4 %
HCT: 32.9 % — ABNORMAL LOW (ref 39.0–52.0)
Hemoglobin: 10.5 g/dL — ABNORMAL LOW (ref 13.0–17.0)
Immature Granulocytes: 0 %
Lymphocytes Relative: 33 %
Lymphs Abs: 2.5 10*3/uL (ref 0.7–4.0)
MCH: 32.8 pg (ref 26.0–34.0)
MCHC: 31.9 g/dL (ref 30.0–36.0)
MCV: 102.8 fL — ABNORMAL HIGH (ref 80.0–100.0)
Monocytes Absolute: 0.7 10*3/uL (ref 0.1–1.0)
Monocytes Relative: 10 %
Neutro Abs: 3.8 10*3/uL (ref 1.7–7.7)
Neutrophils Relative %: 52 %
Platelets: 228 10*3/uL (ref 150–400)
RBC: 3.2 MIL/uL — ABNORMAL LOW (ref 4.22–5.81)
RDW: 13.5 % (ref 11.5–15.5)
WBC: 7.4 10*3/uL (ref 4.0–10.5)
nRBC: 0 % (ref 0.0–0.2)

## 2021-10-09 NOTE — ED Triage Notes (Signed)
Pt arrives via EMS from home with generalized weakness. Per EMS, pt is at baseline with slurred speech and facial droop. Pt recently seen at [facility] for urinary retention and foley catheter was placed. 20G LAC.

## 2021-10-09 NOTE — ED Provider Notes (Signed)
[facility] EMERGENCY DEPARTMENT Provider Note  CSN: [id number] Arrival date & time: 10/09/21 1827  Chief Complaint(s) Weakness  HPI [name] [name] is a 88 y.o. male with PMH previous CVA with right-sided deficits and right-sided facial droop, dementia, recent presentation to [facility] for acute urinary retention requiring catheterization who presents emergency department for evaluation of generalized weakness.  History obtained from patient's wife who states that for a period of approximately 2 hours today the patient was generally somnolent and had severe weakness of upper and lower extremities.  The symptoms have since resolved.  Patient denies chest pain, shortness of breath, abdominal pain, nausea, vomiting or other systemic symptoms.   Weakness  Past Medical History Past Medical History:  Diagnosis Date   Acute CVA (cerebrovascular accident) ([place]) [id number]   Acute metabolic encephalopathy [id number]   Anxiety    CAP (community acquired pneumonia) 04/04/2017   Change in bowel habits 11/06/2016   Dementia ([place])    Diabetes mellitus    Diabetes mellitus ([place]) 04/04/2017   Essential tremor 08/20/2015   Hyperlipidemia    Late onset Alzheimer's disease without behavioral disturbance ([place]) 10/13/2018   MCI (mild cognitive impairment) with memory loss 08/20/2015   Memory loss 02/13/2015   Obesity    OBSTRUCTIVE SLEEP APNEA 07/19/2008   Qualifier: Diagnosis of  By: [name] LPN, [name]     Osteoarthritis of knee 10/01/2017   PNA (pneumonia)    Sleep apnea    uses CPAP at night to sleep   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   Stroke (cerebrum) (HCC)    Stroke ([place])    Tremor, essential [id number]   Umbilical hernia without obstruction and without gangrene 11/06/2016   Vision loss, central, left 10/13/2018   Patient Active Problem List   Diagnosis Date Noted   Anemia in chronic kidney disease 11/28/2020   Chronic kidney disease due to hypertension 11/28/2020   Chronic  sinusitis 11/28/2020   Chronic venous insufficiency 11/28/2020   Essential hypertension 11/28/2020   Gastroesophageal reflux disease 11/28/2020   Hypertrophy of prostate without urinary obstruction and other lower urinary tract symptoms (LUTS) 11/28/2020   Hypoglycemia due to type 2 diabetes mellitus ([place]) 11/28/2020   Insomnia 11/28/2020   Ischemic optic neuropathy 11/28/2020   Late effects of cerebrovascular disease 11/28/2020   [place]ela, post-stroke 11/28/2020   Long term (current) use of insulin ([place]) 11/28/2020   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   Other specified cardiac dysrhythmias 11/28/2020   Proteinuria 11/28/2020   Other psoriasis 11/28/2020   Psoriasis 11/28/2020   Vision loss, central, left 10/13/2018   Tremor, essential 10/13/2018   Late onset Alzheimer's disease without behavioral disturbance ([place]) 10/13/2018   Osteoarthritis of knee 10/01/2017   Stroke (cerebrum) ([place])    Acute CVA (cerebrovascular accident) ([place]) 04/05/2017   CAP (community acquired pneumonia) [id number]   Acute metabolic encephalopathy [id number]   Dementia ([place]) 04/04/2017   Diabetes mellitus ([place]) 04/04/2017   Hyperlipidemia 04/04/2017   Change in bowel habits [id number]   Umbilical hernia without obstruction and without gangrene 11/06/2016   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 Authorizing Provider  ACCU-CHEK SMARTVIEW test strip USE ONCE A DAY AS DIRECTED 02/17/18   [provider]  aspirin EC 325 MG EC tablet Take 1 tablet (325  mg total) by mouth daily. 04/06/17   Rai, Vernelle Emerald, MD  B-D ULTRAFINE III SHORT PEN 31G X 8 MM MISC  12/28/14   [provider]  Cholecalciferol (VITAMIN D-3 PO) Take 1,000 Units by mouth daily.      [provider]  citalopram (CELEXA) 20 MG tablet TAKE 1 TABLET (20 MG TOTAL) BY MOUTH DAILY. 08/04/16   Dohmeier, Asencion Partridge, MD  Continuous Blood Gluc Sensor (FREESTYLE LIBRE 2 SENSOR) MISC See admin instructions. 01/06/21   [provider]  doxazosin (CARDURA) 4 MG tablet Take 4 mg by mouth daily.    [provider]  folic acid (FOLVITE) Q000111Q MCG tablet Take 800 mcg by mouth daily.    [provider]  guaiFENesin-dextromethorphan (ROBITUSSIN DM) 100-10 MG/5ML syrup Take 5 mLs by mouth every 4 (four) hours as needed for cough. Patient not taking: Reported on 07/18/2021 04/06/17   Rai, Vernelle Emerald, MD  insulin glargine (LANTUS) 100 UNIT/ML injection Inject 14 Units into the skin daily after supper.    [provider]  loratadine-pseudoephedrine (CLARITIN-D 24-HOUR) 10-240 MG per 24 hr tablet Take 1 tablet by mouth daily as needed for allergies.    [provider]  metFORMIN (GLUCOPHAGE) 1000 MG tablet Take 1,000 mg by mouth 2 (two) times daily with a meal.    [provider]  methotrexate (RHEUMATREX) 2.5 MG tablet Take by mouth. 12/18/11   [provider]  Multiple Vitamin (MULTIVITAMIN WITH MINERALS) TABS Take 1 tablet by mouth daily.    [provider]  omeprazole (PRILOSEC OTC) 20 MG tablet 1 tablet 30 minutes before morning meal Patient not taking: Reported on 07/18/2021 12/04/20   [provider]  omeprazole (PRILOSEC) 40 MG capsule Take 40 mg by mouth daily. 12/06/14   [provider]  simvastatin (ZOCOR) 40 MG tablet Take 40 mg by mouth daily. 08/03/15   [provider]                                                                                                                                    Past Surgical History Past Surgical History:  Procedure Laterality Date   cipap     HERNIA REPAIR Right    REPLACEMENT TOTAL KNEE Left    Dr Marissa Nestle / Dr Henderson Newcomer RESECTION OF PROSTATE      Family History Family History  Problem Relation Age of Onset   Liver disease Mother    Cancer - Lung Father        smoker    Social History Social History   Tobacco Use   Smoking status: Never   Smokeless tobacco: Never  Vaping Use   Vaping Use: Never used  Substance Use Topics   Alcohol use: No   Drug use: No   Allergies Donepezil, Lisinopril, Methocarbamol, Namenda [memantine hcl], and Other  Review of Systems Review of Systems  Neurological:  Positive  for weakness.   Physical Exam Vital Signs  I have reviewed the triage vital signs BP (!) 118/91 (BP Location: Right Arm)    Pulse (!) 54    Temp 98.2 F (36.8 C) (Oral)    Resp (!) 23    Ht 5\' 9"  (1.753 m)    Wt 80.6 kg    SpO2 97%    BMI 26.24 kg/m   Physical Exam Vitals and nursing note reviewed.  Constitutional:      General: He is not in acute distress.    Appearance: He is well-developed.  HENT:     Head: Normocephalic and atraumatic.  Eyes:     Conjunctiva/sclera: Conjunctivae normal.  Cardiovascular:     Rate and Rhythm: Normal rate and regular rhythm.     Heart sounds: No murmur heard. Pulmonary:     Effort: Pulmonary effort is normal. No respiratory distress.     Breath sounds: Normal breath sounds.  Abdominal:     Palpations: Abdomen is soft.     Tenderness: There is no abdominal tenderness.  Musculoskeletal:        General: No swelling.     Cervical back: Neck supple.  Skin:    General: Skin is warm and dry.     Capillary Refill: Capillary refill takes less than 2 seconds.  Neurological:     Mental Status: He is alert.     Cranial Nerves: Cranial nerve deficit (Right-sided facial droop) present.  Psychiatric:        Mood and Affect: Mood normal.    ED Results and Treatments Labs (all labs ordered are listed, but only abnormal results are displayed) Labs Reviewed  COMPREHENSIVE METABOLIC PANEL - Abnormal; Notable for the following components:      Result Value   Glucose, Bld 117  (*)    Total Protein 5.9 (*)    Albumin 3.2 (*)    GFR, Estimated 59 (*)    All other components within normal limits  CBC WITH DIFFERENTIAL/PLATELET - Abnormal; Notable for the following components:   RBC 3.20 (*)    Hemoglobin 10.5 (*)    HCT 32.9 (*)    MCV 102.8 (*)    All other components within normal limits  URINALYSIS, ROUTINE W REFLEX MICROSCOPIC - Abnormal; Notable for the following components:   APPearance HAZY (*)    Leukocytes,Ua MODERATE (*)    Bacteria, UA RARE (*)    All other components within normal limits  TSH                                                                                                                          Radiology CT Head Wo Contrast  Result Date: 10/09/2021 CLINICAL DATA:  Delirium. EXAM: CT HEAD WITHOUT CONTRAST TECHNIQUE: Contiguous axial images were obtained from the base of the skull through the vertex without intravenous contrast. RADIATION DOSE REDUCTION: This exam was performed according to the departmental dose-optimization program which includes automated exposure control,  adjustment of the mA and/or kV according to patient size and/or use of iterative reconstruction technique. COMPARISON:  Head CT dated 04/04/2017. FINDINGS: Brain: Mild age-related atrophy and chronic microvascular ischemic changes. Left basal ganglia old lacunar infarct. There is no acute intracranial hemorrhage. No mass effect midline shift. No extra-axial fluid collection. Vascular: No hyperdense vessel or unexpected calcification. Skull: Normal. Negative for fracture or focal lesion. Sinuses/Orbits: No acute finding. Other: None IMPRESSION: 1. No acute intracranial pathology. 2. Mild age-related atrophy and chronic microvascular ischemic changes. Electronically Signed   By: Anner Crete M.D.   On: 10/09/2021 21:00    Pertinent labs & imaging results that were available during my care of the patient were reviewed by me and considered in my medical decision making  (see MDM for details).  Medications Ordered in ED Medications - No data to display                                                                                                                                   Procedures Procedures  (including critical care time)  Medical Decision Making / ED Course   This patient presents to the ED for concern of generalized weakness, this involves an extensive number of treatment options, and is a complaint that carries with it a high risk of complications and morbidity.  The differential diagnosis includes electrolyte abnormality, UTI, CVA, TIA  MDM: Patient seen emergency department for evaluation of generalized weakness.  Physical exam is a right-sided facial droop which is chronic for this patient.  Foley catheter in place with no sediment in the urinary bag.  Laboratory evaluation with a mild anemia to 10.5 and MCV of 102.8 which is baseline for this patient.  Laboratory evaluation otherwise unremarkable.  CT head unremarkable.  On reevaluation, patient's symptoms have completely resolved and he is able to ambulate with a walker which is his baseline.  Per his wife, the patient has returned to his baseline.  Patient safe for outpatient follow-up at this time and he was discharged from the emergency department.   Additional history obtained: -Additional history obtained from wife -External records from outside source obtained and reviewed including: Chart review including previous notes, labs, imaging, consultation notes   Lab Tests: -I ordered, reviewed, and interpreted labs.   The pertinent results include:   Labs Reviewed  COMPREHENSIVE METABOLIC PANEL - Abnormal; Notable for the following components:      Result Value   Glucose, Bld 117 (*)    Total Protein 5.9 (*)    Albumin 3.2 (*)    GFR, Estimated 59 (*)    All other components within normal limits  CBC WITH DIFFERENTIAL/PLATELET - Abnormal; Notable for the following components:    RBC 3.20 (*)    Hemoglobin 10.5 (*)    HCT 32.9 (*)    MCV 102.8 (*)    All other components within normal limits  URINALYSIS, ROUTINE W  REFLEX MICROSCOPIC - Abnormal; Notable for the following components:   APPearance HAZY (*)    Leukocytes,Ua MODERATE (*)    Bacteria, UA RARE (*)    All other components within normal limits  TSH      EKG   EKG Interpretation  Date/Time:    Ventricular Rate:    PR Interval:    QRS Duration:   QT Interval:    QTC Calculation:   R Axis:     Text Interpretation:        Atrial fibrillation   Imaging Studies ordered: I ordered imaging studies including CT head I independently visualized and interpreted imaging. I agree with the radiologist interpretation   Medicines ordered and prescription drug management: No orders of the defined types were placed in this encounter.   -I have reviewed the patients home medicines and have made adjustments as needed  Critical interventions none  Cardiac Monitoring: The patient was maintained on a cardiac monitor.  I personally viewed and interpreted the cardiac monitored which showed an underlying rhythm of: Atrial fibrillation  Social Determinants of Health:  Factors impacting patients care include: none   Reevaluation: After the interventions noted above, I reevaluated the patient and found that they have :improved  Co morbidities that complicate the patient evaluation  Past Medical History:  Diagnosis Date   Acute CVA (cerebrovascular accident) ([place]) [id number]   Acute metabolic encephalopathy [id number]   Anxiety    CAP (community acquired pneumonia) 04/04/2017   Change in bowel habits 11/06/2016   Dementia ([place])    Diabetes mellitus    Diabetes mellitus ([place]) 04/04/2017   Essential tremor 08/20/2015   Hyperlipidemia    Late onset Alzheimer's disease without behavioral disturbance ([place]) 10/13/2018   MCI (mild cognitive impairment) with memory loss 08/20/2015   Memory loss 02/13/2015    Obesity    OBSTRUCTIVE SLEEP APNEA 07/19/2008   Qualifier: Diagnosis of  By: [name] LPN, [name]     Osteoarthritis of knee 10/01/2017   PNA (pneumonia)    Sleep apnea    uses CPAP at night to sleep   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   Stroke (cerebrum) ([place])    Stroke ([place])    Tremor, essential [id number]   Umbilical hernia without obstruction and without gangrene 11/06/2016   Vision loss, central, left 10/13/2018      Dispostion: I considered admission for this patient, but patient has returned to normal mental status baseline, normal amatory baseline and has no inpatient criteria for admission at this time.  Patient safe for outpatient follow-up.     Final Clinical Impression(s) / ED Diagnoses Final diagnoses:  Generalized weakness     @PCDICTATION @    Teressa Lower, MD 10/09/21 2300

## 2021-10-09 NOTE — ED Notes (Signed)
Patient transported to CT 

## 2021-10-17 DIAGNOSIS — R338 Other retention of urine: Secondary | ICD-10-CM | POA: Diagnosis not present

## 2021-10-29 DIAGNOSIS — N401 Enlarged prostate with lower urinary tract symptoms: Secondary | ICD-10-CM | POA: Diagnosis not present

## 2021-10-29 DIAGNOSIS — R338 Other retention of urine: Secondary | ICD-10-CM | POA: Diagnosis not present

## 2021-10-30 DIAGNOSIS — N401 Enlarged prostate with lower urinary tract symptoms: Secondary | ICD-10-CM | POA: Diagnosis not present

## 2021-12-17 ENCOUNTER — Ambulatory Visit: Payer: Medicare PPO | Admitting: Podiatry

## 2021-12-17 ENCOUNTER — Encounter: Payer: Self-pay | Admitting: Podiatry

## 2021-12-17 DIAGNOSIS — M79675 Pain in left toe(s): Secondary | ICD-10-CM | POA: Diagnosis not present

## 2021-12-17 DIAGNOSIS — R600 Localized edema: Secondary | ICD-10-CM

## 2021-12-17 DIAGNOSIS — E119 Type 2 diabetes mellitus without complications: Secondary | ICD-10-CM

## 2021-12-17 DIAGNOSIS — M79674 Pain in right toe(s): Secondary | ICD-10-CM

## 2021-12-17 DIAGNOSIS — M2041 Other hammer toe(s) (acquired), right foot: Secondary | ICD-10-CM | POA: Diagnosis not present

## 2021-12-17 DIAGNOSIS — M2042 Other hammer toe(s) (acquired), left foot: Secondary | ICD-10-CM | POA: Diagnosis not present

## 2021-12-17 DIAGNOSIS — Z794 Long term (current) use of insulin: Secondary | ICD-10-CM

## 2021-12-17 DIAGNOSIS — B351 Tinea unguium: Secondary | ICD-10-CM | POA: Diagnosis not present

## 2021-12-22 NOTE — Progress Notes (Signed)
ANNUAL DIABETIC FOOT EXAM ? ?Subjective: ?[name] [name] presents today for annual diabetic foot examination. ? ?Patient denies any h/o foot wounds. ? ?Patient denies any numbness, tingling, burning, or pins/needle sensation in feet. ? ?Patient's blood sugar was 160 mg/dl today. ? ?Risk factors: diabetes, h/o CVA, HTN, CKD, hyperlipidemia. ? ?[name] Contras, MD is patient's PCP. Last visit was November 18, 2021. ? ?Past Medical History:  ?Diagnosis Date  ? Acute CVA (cerebrovascular accident) ([place]) 04/05/2017  ? Acute metabolic encephalopathy [id number]  ? Anxiety   ? CAP (community acquired pneumonia) 04/04/2017  ? Change in bowel habits 11/06/2016  ? Dementia ([place])   ? Diabetes mellitus   ? Diabetes mellitus ([place]) 04/04/2017  ? Essential tremor 08/20/2015  ? Hyperlipidemia   ? Late onset Alzheimer's disease without behavioral disturbance ([name] [name]) 10/13/2018  ? MCI (mild cognitive impairment) with memory loss 08/20/2015  ? Memory loss 02/13/2015  ? Obesity   ? OBSTRUCTIVE SLEEP APNEA 07/19/2008  ? Qualifier: Diagnosis of  By: [name] LPN, [name]    ? Osteoarthritis of knee 10/01/2017  ? PNA (pneumonia)   ? Sleep apnea   ? uses CPAP at night to sleep  ? 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  ? Stroke (cerebrum) ([place])   ? Stroke [facility])   ? Tremor, essential 10/13/2018  ? Umbilical hernia without obstruction and without gangrene 11/06/2016  ? Vision loss, central, left 10/13/2018  ? ?Patient Active Problem List  ? Diagnosis Date Noted  ? Anemia in chronic kidney disease 11/28/2020  ? Chronic kidney disease due to hypertension 11/28/2020  ? Chronic sinusitis 11/28/2020  ? Chronic venous insufficiency 11/28/2020  ? Essential hypertension 11/28/2020  ? Gastroesophageal reflux disease 11/28/2020  ? Hypertrophy of prostate without urinary obstruction and other lower urinary tract symptoms (LUTS) 11/28/2020  ? Hypoglycemia due to type 2 diabetes mellitus ([place]) 11/28/2020  ? Insomnia 11/28/2020  ? Ischemic optic neuropathy 11/28/2020  ? Late effects of  cerebrovascular disease 11/28/2020  ? [place], post-stroke 11/28/2020  ? Long term (current) use of insulin ([name]) 11/28/2020  ? 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  ? Other specified cardiac dysrhythmias 11/28/2020  ? Proteinuria 11/28/2020  ? Other psoriasis 11/28/2020  ? Psoriasis 11/28/2020  ? Vision loss, central, left 10/13/2018  ? Tremor, essential 10/13/2018  ? Late onset Alzheimer's disease without behavioral disturbance ([name] [name]) 10/13/2018  ? Osteoarthritis of knee 10/01/2017  ? Stroke (cerebrum) ([name] [name])   ? Acute CVA (cerebrovascular accident) ([place]) 04/05/2017  ? CAP (community acquired pneumonia) 04/04/2017  ? Acute metabolic encephalopathy [id number]  ? Dementia ([name] [name]) 04/04/2017  ? Diabetes mellitus ([name] [name]) 04/04/2017  ? Hyperlipidemia 04/04/2017  ? Change in bowel habits 11/06/2016  ? Umbilical hernia without obstruction and without gangrene 11/06/2016  ? 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  ? Essential tremor 08/20/2015  ? MCI (mild cognitive impairment) with memory loss 08/20/2015  ? Memory loss 02/13/2015  ? OBSTRUCTIVE SLEEP APNEA 07/19/2008  ? ?Past Surgical History:  ?Procedure Laterality Date  ? cipap    ? HERNIA REPAIR Right   ? REPLACEMENT TOTAL KNEE Left   ? Dr Marissa Nestle / Dr Collie Siad  ? TRANSURETHRAL RESECTION OF PROSTATE    ? ?Current Outpatient Medications on File Prior to Visit  ?Medication Sig Dispense Refill  ? ACCU-CHEK SMARTVIEW test strip USE ONCE A DAY AS DIRECTED  1  ? aspirin EC 325 MG EC tablet Take 1 tablet (325 mg total) by mouth daily. 30 tablet 4  ? B-D ULTRAFINE III SHORT PEN 31G X 8 MM MISC  1  ? Cholecalciferol (VITAMIN D-3 PO) Take 1,000 Units by mouth daily.     ? citalopram (CELEXA) 20 MG tablet TAKE 1 TABLET (20 MG TOTAL) BY MOUTH DAILY. 90 tablet 3  ? Continuous Blood Gluc Sensor (FREESTYLE LIBRE 2 SENSOR) MISC See admin instructions.    ? doxazosin (CARDURA) 4 MG tablet Take 4 mg by mouth daily.    ? folic acid (FOLVITE) [id number] MCG tablet Take 800 mcg by mouth daily.    ?  guaiFENesin-dextromethorphan (ROBITUSSIN DM) 100-10 MG/5ML syrup Take 5 mLs by mouth every 4 (four) hours as needed for cough. (Patient not taking: Reported on 07/18/2021) 118 mL 0  ? insulin glargine (LANTUS) 100 UNIT/ML injection Inject 14 Units into the skin daily after supper.    ? loratadine-pseudoephedrine (CLARITIN-D 24-HOUR) 10-240 MG per 24 hr tablet Take 1 tablet by mouth daily as needed for allergies.    ? metFORMIN (GLUCOPHAGE) 1000 MG tablet Take 1,000 mg by mouth 2 (two) times daily with a meal.    ? methotrexate (RHEUMATREX) 2.5 MG tablet Take by mouth.    ? Multiple Vitamin (MULTIVITAMIN WITH MINERALS) TABS Take 1 tablet by mouth daily.    ? omeprazole (PRILOSEC OTC) 20 MG tablet 1 tablet 30 minutes before morning meal (Patient not taking: Reported on 07/18/2021)    ? omeprazole (PRILOSEC) 40 MG capsule Take 40 mg by mouth daily.  0  ? simvastatin (ZOCOR) 40 MG tablet Take 40 mg by mouth daily.  0  ? simvastatin (ZOCOR) 40 MG tablet Take 1 tablet by mouth daily.    ? ?No current facility-administered medications on file prior to visit.  ?  ?Allergies  ?Allergen Reactions  ? Donepezil Shortness Of Breath, Swelling and Other (See Comments)  ?  Other reaction(s): shortness of breath  ? Lisinopril Anaphylaxis, Shortness Of Breath and Swelling  ?  HAD TO COME TO THE E.D. AFTER TAKING THIS!! ?Other reaction(s): angioedema  ? Methocarbamol Anaphylaxis, Shortness Of Breath and Swelling  ?  HAD TO COME TO THE E.D. AFTER TAKING THIS!! ?Other reaction(s): OTHER REACTION, swelling  ? Namenda [Memantine Hcl] Shortness Of Breath and Swelling  ? Other   ?  Other reaction(s): shortness of breath  ? ?Social History  ? ?Occupational History  ? Occupation: retired  ?Tobacco Use  ? Smoking status: Never  ? Smokeless tobacco: Never  ?Vaping Use  ? Vaping Use: Never used  ?Substance and Sexual Activity  ? Alcohol use: No  ? Drug use: No  ? Sexual activity: Not on file  ? ?Family History  ?Problem Relation Age of Onset  ?  Liver disease Mother   ? Cancer - Lung Father   ?     smoker  ? ?Immunization History  ?Administered Date(s) Administered  ? Influenza Split 06/19/2009  ? Influenza, High Dose Seasonal PF 08/05/2018  ? Influenza-Unspecified 10/13/2002, 07/06/2003, 09/11/2004, 06/29/2006, 06/14/2007, 08/23/2008, 06/15/2009, 06/28/2010, 06/16/2011  ? PFIZER(Purple Top)SARS-COV-2 Vaccination 12/05/2019, 12/26/2019  ? Pneumococcal Conjugate-13 08/08/2014  ? Pneumococcal Polysaccharide-23 11/14/2006  ? Pneumococcal-Unspecified 03/21/2002  ? Tdap 03/01/2015  ? Zoster, Live 09/15/2010  ?  ? ?Review of Systems: Negative except as noted in the HPI.  ? ?Objective: ?There were no vitals filed for this visit. ? ?Kamuela Shamon is a pleasant 88 y.o. male in NAD. AAO X 3. ? ?Vascular Examination: ?CFT <3 seconds b/l LE. Palpable pedal pulses b/l LE. Pedal hair absent. No pain with calf compression b/l. Lower extremity skin temperature gradient within normal  limits. Trace edema noted BLE. No cyanosis or clubbing noted b/l LE. ? ?Dermatological Examination: ?Pedal integument with normal turgor, texture and tone b/l LE. No open wounds b/l. No interdigital macerations b/l. Toenails 1-5 b/l elongated, thickened, discolored with subungual debris. +Tenderness with dorsal palpation of nailplates. No hyperkeratotic or porokeratotic lesions present. ? ?Neurological Examination: ?Protective sensation intact 5/5 intact bilaterally with 10g monofilament b/l. Vibratory sensation intact b/l. ? ?Musculoskeletal Examination: ?Muscle strength 5/5 to all lower extremity muscle groups bilaterally. Hammertoe deformity noted 2-5 b/l. ? ?Footwear Assessment: ?Does the patient wear appropriate shoes? Yes. ?Does the patient need inserts/orthotics? No. ? ?Assessment: ?1. Pain due to onychomycosis of toenails of both feet   ?2. Acquired hammertoes of both feet   ?3. Lower extremity edema   ?4. Type 2 diabetes mellitus without complication, with long-term current use  of insulin ([place])   ?5. Encounter for diabetic foot exam ([place])   ?  ?ADA Risk Categorization: ?Low Risk :  ?Patient has all of the following: ?Intact protective sensation ?No prior foot ulcer  ?No severe deform

## 2022-01-13 DIAGNOSIS — D692 Other nonthrombocytopenic purpura: Secondary | ICD-10-CM | POA: Diagnosis not present

## 2022-01-13 DIAGNOSIS — K219 Gastro-esophageal reflux disease without esophagitis: Secondary | ICD-10-CM | POA: Diagnosis not present

## 2022-01-13 DIAGNOSIS — E1122 Type 2 diabetes mellitus with diabetic chronic kidney disease: Secondary | ICD-10-CM | POA: Diagnosis not present

## 2022-01-13 DIAGNOSIS — N1831 Chronic kidney disease, stage 3a: Secondary | ICD-10-CM | POA: Diagnosis not present

## 2022-01-13 DIAGNOSIS — I6930 Unspecified sequelae of cerebral infarction: Secondary | ICD-10-CM | POA: Diagnosis not present

## 2022-01-13 DIAGNOSIS — E782 Mixed hyperlipidemia: Secondary | ICD-10-CM | POA: Diagnosis not present

## 2022-01-13 DIAGNOSIS — G301 Alzheimer's disease with late onset: Secondary | ICD-10-CM | POA: Diagnosis not present

## 2022-01-13 DIAGNOSIS — F02B0 Dementia in other diseases classified elsewhere, moderate, without behavioral disturbance, psychotic disturbance, mood disturbance, and anxiety: Secondary | ICD-10-CM | POA: Diagnosis not present

## 2022-01-13 DIAGNOSIS — I129 Hypertensive chronic kidney disease with stage 1 through stage 4 chronic kidney disease, or unspecified chronic kidney disease: Secondary | ICD-10-CM | POA: Diagnosis not present

## 2022-03-05 IMAGING — CT CT ANGIO CHEST
4 of 15 series · 18 of 46 positions shown · IV contrast (omnipaque)
Comparison: None.

CLINICAL DATA: Shortness of breath, elevated D-dimer

EXAM:
CT ANGIOGRAPHY CHEST WITH CONTRAST
TECHNIQUE: Multidetector CT imaging of the chest was performed using the
standard protocol during bolus administration of intravenous
contrast. Multiplanar CT image reconstructions and MIPs were
obtained to evaluate the vascular anatomy.
CONTRAST:  80mL OMNIPAQUE IOHEXOL 350 MG/ML SOLN

[Series 5: thins · axial · 0.77mm/px · z∈[-393,-147]mm · 11 of 296 slices shown (1 of 2)]
[im 25/296  lung]
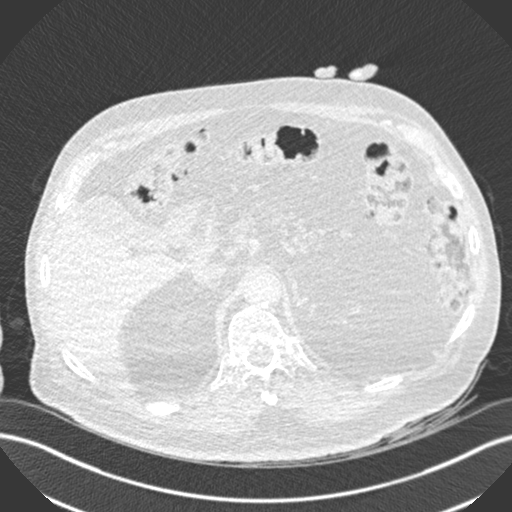
[im 50/296  soft-tissue]
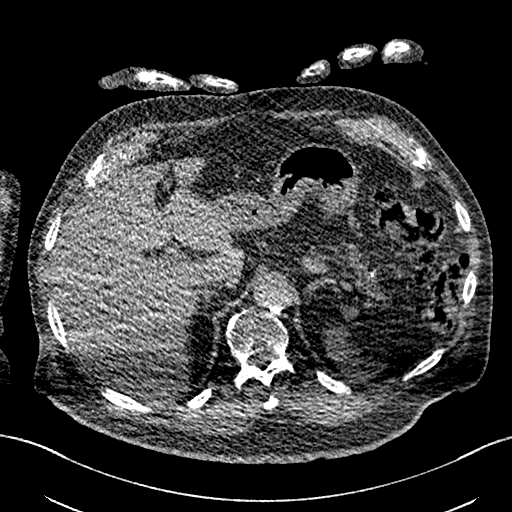
[im 74/296  lung]
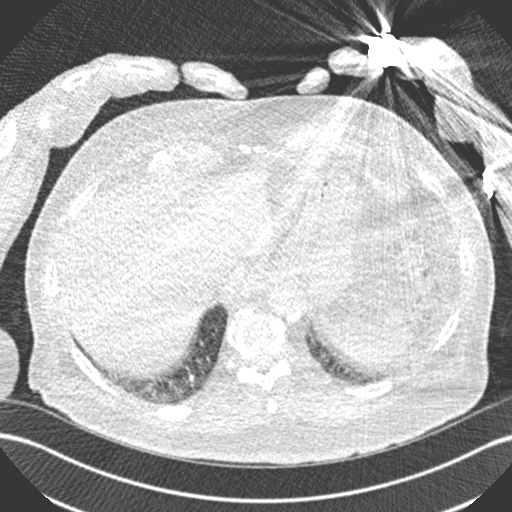
[im 99/296  soft-tissue]
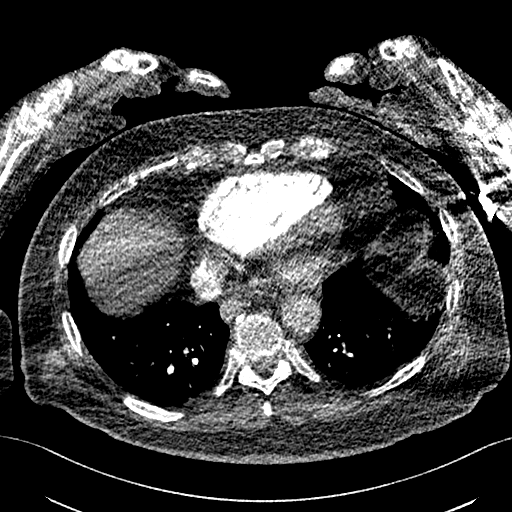
[im 123/296  lung]
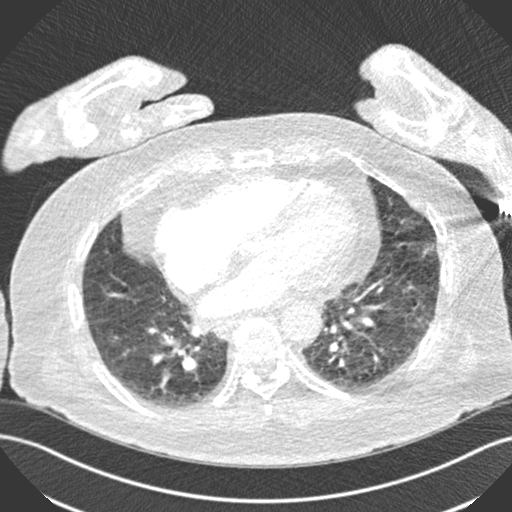
[im 148/296  soft-tissue]
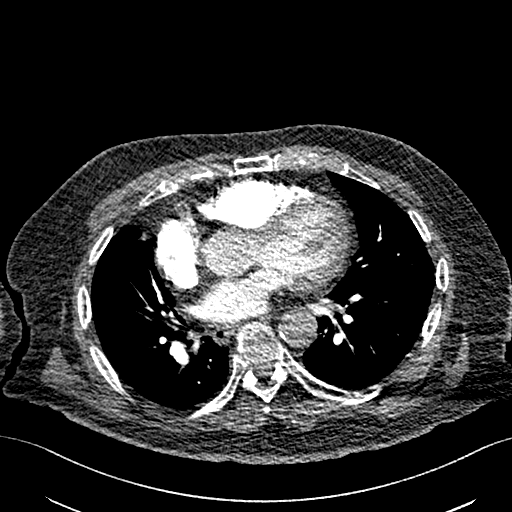
[im 173/296  lung]
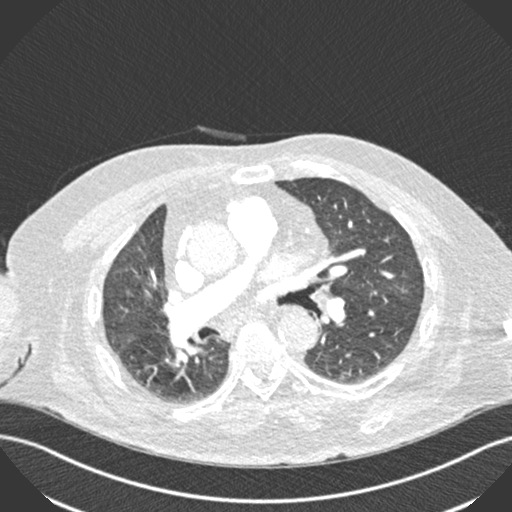
[im 197/296  soft-tissue]
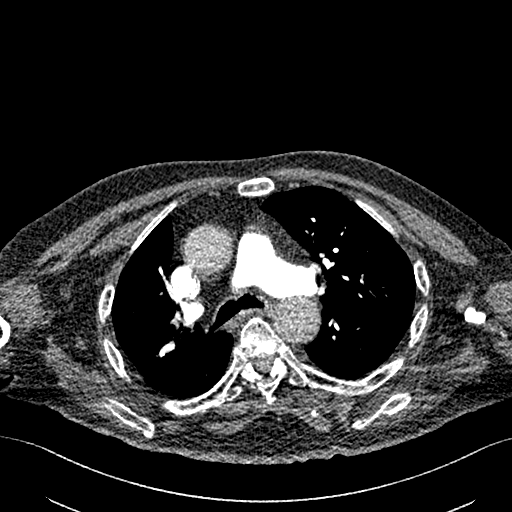
[im 222/296  lung]
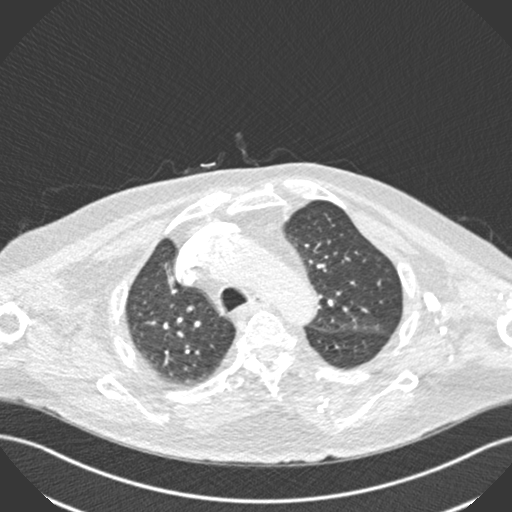
[im 246/296  soft-tissue]
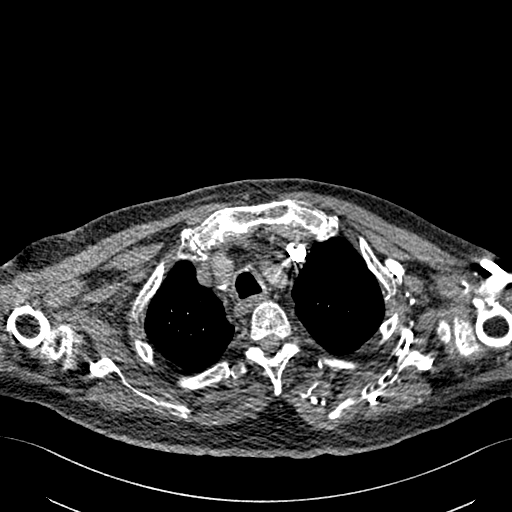
[im 271/296  lung]
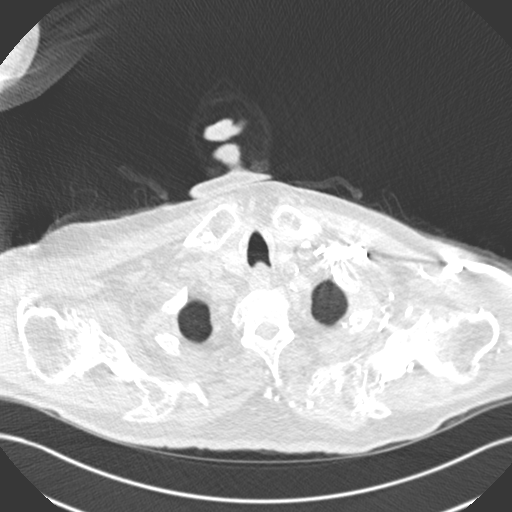

[Series 6: lung · axial · 0.73mm/px · z∈[-291,-216]mm · 2 of 75 slices shown]
[im 25/75  soft-tissue]
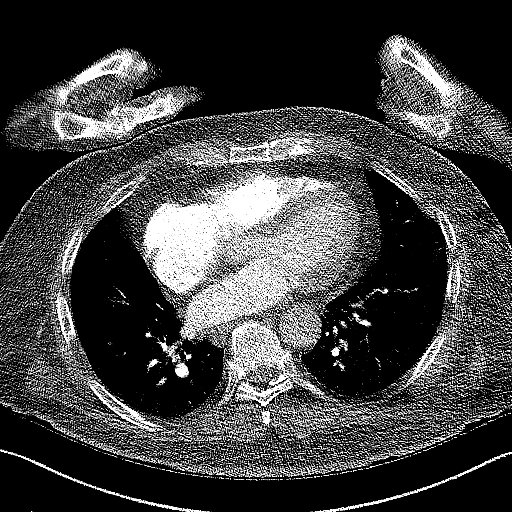
[im 50/75  soft-tissue]
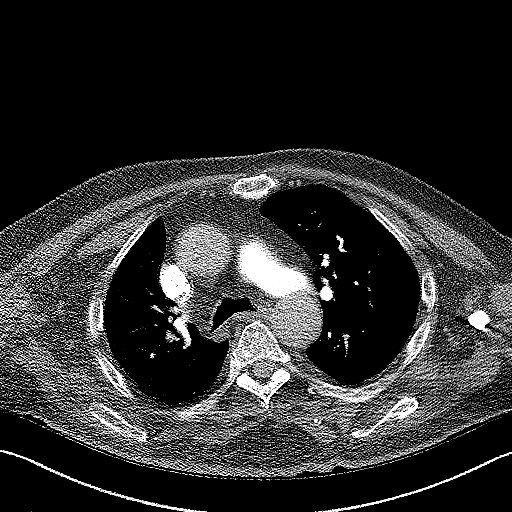

[Series 12: thins · axial · 0.77mm/px · z∈[-394,-316]mm · 4 of 131 slices shown (2 of 2)]
[im 27/131  lung]
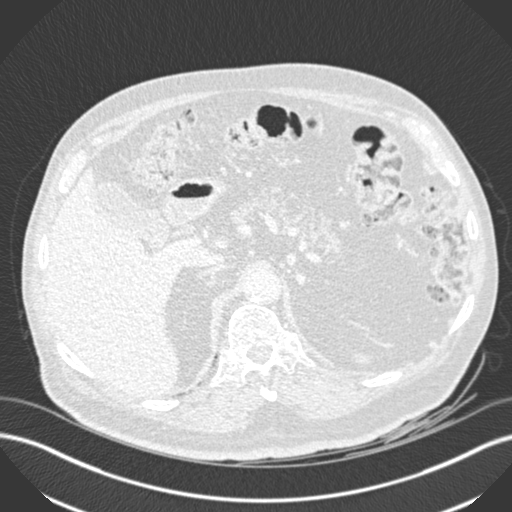
[im 53/131  lung]
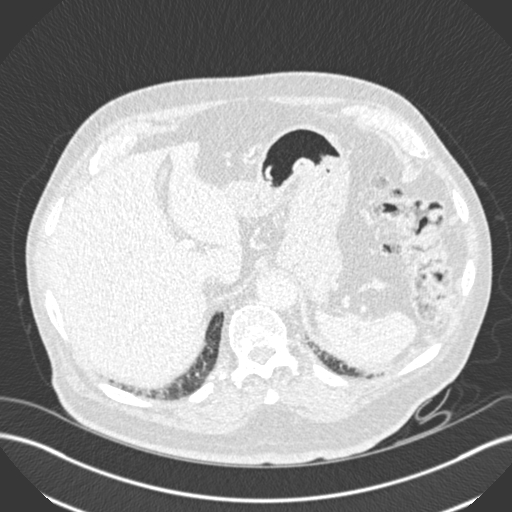
[im 79/131  lung]
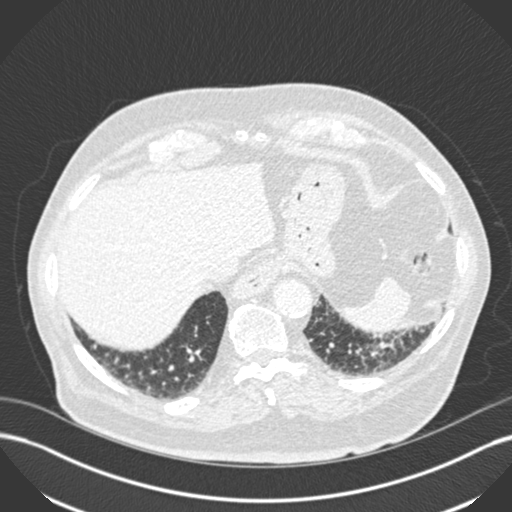
[im 105/131  lung]
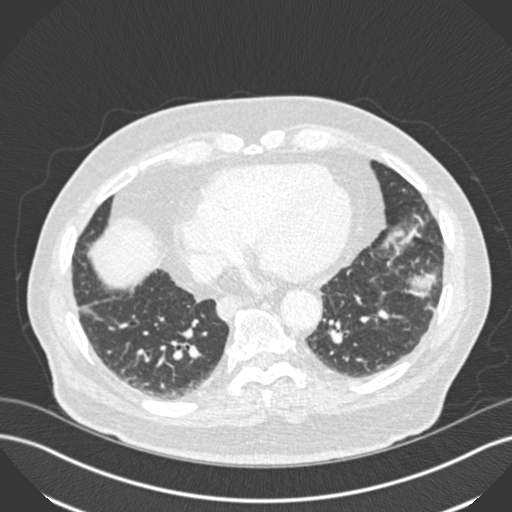

[Series 14: coronal mpr · coronal · 0.31mm/px · 1 of 151 slices shown]
[im 76/151  soft-tissue]
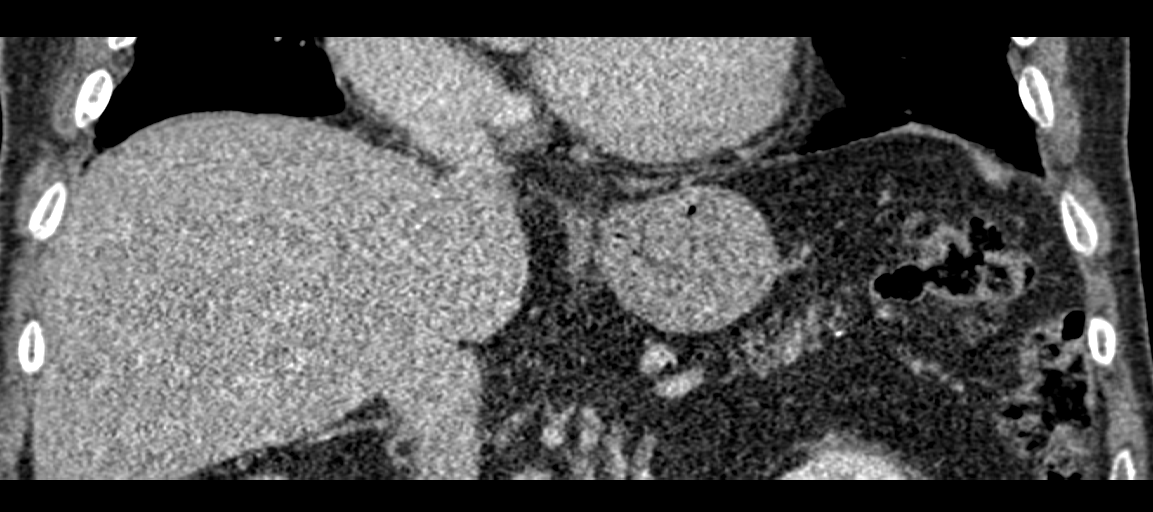

[18 of 46 positions shown; findings below may reference images not displayed]

FINDINGS: Cardiovascular: Satisfactory opacification of the pulmonary arteries
to the segmental level. No evidence of pulmonary embolism. Enlarged
heart size. No pericardial effusion. Thoracic aortic
atherosclerosis. Multi vessel coronary artery atherosclerosis.

Mediastinum/Nodes: No enlarged mediastinal, hilar, or axillary lymph
nodes. Thyroid gland, trachea, and esophagus demonstrate no
significant findings.

Lungs/Pleura: Lungs are clear. No pleural effusion or pneumothorax.

Upper Abdomen: No acute abnormality.

Musculoskeletal: No acute osseous abnormality. No aggressive osseous
lesion. Degenerative disease with disc height loss at C6-7.

Review of the MIP images confirms the above findings.
IMPRESSION: 1. No evidence of pulmonary embolus.
2. Cardiomegaly.
3. Aortic Atherosclerosis ([id number]).

## 2022-03-24 ENCOUNTER — Ambulatory Visit: Payer: Medicare PPO | Admitting: Podiatry

## 2022-04-04 ENCOUNTER — Ambulatory Visit: Payer: Medicare PPO | Admitting: Podiatry

## 2022-07-07 ENCOUNTER — Ambulatory Visit: Payer: Medicare PPO | Admitting: Podiatry

## 2022-08-13 ENCOUNTER — Ambulatory Visit: Payer: Medicare PPO | Admitting: Podiatry

## 2022-08-13 ENCOUNTER — Telehealth: Payer: Self-pay | Admitting: Podiatry

## 2022-08-13 NOTE — Telephone Encounter (Signed)
Pts daughter called and cxled appts for pt as he was having health issues ( oxygen level was low)when she got there to pick him up. She will call to r/s

## 2022-08-15 ENCOUNTER — Emergency Department (HOSPITAL_COMMUNITY): Payer: Medicare PPO

## 2022-08-15 ENCOUNTER — Other Ambulatory Visit: Payer: Self-pay

## 2022-08-15 ENCOUNTER — Observation Stay (HOSPITAL_COMMUNITY)
Admission: EM | Admit: 2022-08-15 | Discharge: 2022-08-16 | Disposition: A | Discharge status: Home Health | Payer: Medicare PPO | Attending: Internal Medicine | Admitting: Internal Medicine

## 2022-08-15 DIAGNOSIS — Z79899 Other long term (current) drug therapy: Secondary | ICD-10-CM | POA: Insufficient documentation

## 2022-08-15 DIAGNOSIS — I5032 Chronic diastolic (congestive) heart failure: Secondary | ICD-10-CM | POA: Diagnosis not present

## 2022-08-15 DIAGNOSIS — F0390 Unspecified dementia without behavioral disturbance: Secondary | ICD-10-CM | POA: Diagnosis not present

## 2022-08-15 DIAGNOSIS — Z7984 Long term (current) use of oral hypoglycemic drugs: Secondary | ICD-10-CM | POA: Diagnosis not present

## 2022-08-15 DIAGNOSIS — R4182 Altered mental status, unspecified: Secondary | ICD-10-CM | POA: Diagnosis not present

## 2022-08-15 DIAGNOSIS — Z794 Long term (current) use of insulin: Secondary | ICD-10-CM | POA: Diagnosis not present

## 2022-08-15 DIAGNOSIS — I959 Hypotension, unspecified: Secondary | ICD-10-CM | POA: Diagnosis not present

## 2022-08-15 DIAGNOSIS — R410 Disorientation, unspecified: Secondary | ICD-10-CM | POA: Insufficient documentation

## 2022-08-15 DIAGNOSIS — R531 Weakness: Secondary | ICD-10-CM | POA: Diagnosis not present

## 2022-08-15 DIAGNOSIS — Z96652 Presence of left artificial knee joint: Secondary | ICD-10-CM | POA: Insufficient documentation

## 2022-08-15 DIAGNOSIS — E1165 Type 2 diabetes mellitus with hyperglycemia: Secondary | ICD-10-CM | POA: Diagnosis not present

## 2022-08-15 DIAGNOSIS — R400 Somnolence: Secondary | ICD-10-CM | POA: Diagnosis not present

## 2022-08-15 DIAGNOSIS — I10 Essential (primary) hypertension: Secondary | ICD-10-CM | POA: Diagnosis not present

## 2022-08-15 DIAGNOSIS — G9341 Metabolic encephalopathy: Secondary | ICD-10-CM

## 2022-08-15 DIAGNOSIS — Z8673 Personal history of transient ischemic attack (TIA), and cerebral infarction without residual deficits: Secondary | ICD-10-CM | POA: Insufficient documentation

## 2022-08-15 DIAGNOSIS — G309 Alzheimer's disease, unspecified: Secondary | ICD-10-CM | POA: Insufficient documentation

## 2022-08-15 DIAGNOSIS — R0689 Other abnormalities of breathing: Secondary | ICD-10-CM | POA: Diagnosis not present

## 2022-08-15 DIAGNOSIS — R001 Bradycardia, unspecified: Secondary | ICD-10-CM | POA: Diagnosis not present

## 2022-08-15 DIAGNOSIS — Z7982 Long term (current) use of aspirin: Secondary | ICD-10-CM | POA: Diagnosis not present

## 2022-08-15 DIAGNOSIS — I4510 Unspecified right bundle-branch block: Secondary | ICD-10-CM | POA: Diagnosis not present

## 2022-08-15 LAB — CBC
HCT: 34.4 % — ABNORMAL LOW (ref 39.0–52.0)
Hemoglobin: 11.3 g/dL — ABNORMAL LOW (ref 13.0–17.0)
MCH: 33.4 pg (ref 26.0–34.0)
MCHC: 32.8 g/dL (ref 30.0–36.0)
MCV: 101.8 fL — ABNORMAL HIGH (ref 80.0–100.0)
Platelets: 207 10*3/uL (ref 150–400)
RBC: 3.38 MIL/uL — ABNORMAL LOW (ref 4.22–5.81)
RDW: 13.7 % (ref 11.5–15.5)
WBC: 7.4 10*3/uL (ref 4.0–10.5)
nRBC: 0 % (ref 0.0–0.2)

## 2022-08-15 LAB — URINALYSIS, ROUTINE W REFLEX MICROSCOPIC
Bilirubin Urine: NEGATIVE
Glucose, UA: NEGATIVE mg/dL
Hgb urine dipstick: NEGATIVE
Ketones, ur: NEGATIVE mg/dL
Leukocytes,Ua: NEGATIVE
Nitrite: NEGATIVE
Protein, ur: NEGATIVE mg/dL
Specific Gravity, Urine: 1.016 (ref 1.005–1.030)
pH: 5 (ref 5.0–8.0)

## 2022-08-15 LAB — COMPREHENSIVE METABOLIC PANEL
ALT: 10 U/L (ref 0–44)
AST: 15 U/L (ref 15–41)
Albumin: 3.6 g/dL (ref 3.5–5.0)
Alkaline Phosphatase: 39 U/L (ref 38–126)
Anion gap: 14 (ref 5–15)
BUN: 21 mg/dL (ref 8–23)
CO2: 24 mmol/L (ref 22–32)
Calcium: 9.9 mg/dL (ref 8.9–10.3)
Chloride: 102 mmol/L (ref 98–111)
Creatinine, Ser: 1.14 mg/dL (ref 0.61–1.24)
GFR, Estimated: >60 mL/min (ref >60)
Glucose, Bld: 139 mg/dL — ABNORMAL HIGH (ref 70–99)
Potassium: 4.3 mmol/L (ref 3.5–5.1)
Sodium: 140 mmol/L (ref 135–145)
Total Bilirubin: 0.6 mg/dL (ref 0.3–1.2)
Total Protein: 6.2 g/dL — ABNORMAL LOW (ref 6.5–8.1)

## 2022-08-15 LAB — CBG MONITORING, ED
Glucose-Capillary: 136 mg/dL — ABNORMAL HIGH (ref 70–99)
Glucose-Capillary: 118 mg/dL — ABNORMAL HIGH (ref 70–99)
Glucose-Capillary: 105 mg/dL — ABNORMAL HIGH (ref 70–99)

## 2022-08-15 MED ORDER — ENOXAPARIN SODIUM 40 MG/0.4ML IJ SOSY
40.0000 mg | PREFILLED_SYRINGE | Freq: Every day | INTRAMUSCULAR | Status: DC
  Administered 2022-08-15: 40 mg via SUBCUTANEOUS
  Filled 2022-08-15: qty 0.4

## 2022-08-15 MED ORDER — POLYETHYLENE GLYCOL 3350 17 G PO PACK: 17.0000 g | PACK | Freq: Every day | ORAL | Status: DC | PRN

## 2022-08-15 MED ORDER — PROCHLORPERAZINE EDISYLATE 10 MG/2ML IJ SOLN: 5.0000 mg | Freq: Four times a day (QID) | INTRAMUSCULAR | Status: DC | PRN

## 2022-08-15 MED ORDER — ACETAMINOPHEN 325 MG PO TABS: 650.0000 mg | ORAL_TABLET | Freq: Four times a day (QID) | ORAL | Status: DC | PRN

## 2022-08-15 MED ORDER — LACTATED RINGERS IV SOLN: INTRAVENOUS | Status: DC

## 2022-08-15 MED ORDER — ADULT MULTIVITAMIN W/MINERALS CH
1.0000 | ORAL_TABLET | Freq: Every day | ORAL | Status: DC
  Administered 2022-08-16: 1 via ORAL
  Filled 2022-08-15: qty 1

## 2022-08-15 MED ORDER — INSULIN ASPART 100 UNIT/ML IJ SOLN
0.0000 [IU] | Freq: Three times a day (TID) | INTRAMUSCULAR | Status: DC
  Administered 2022-08-16: 2 [IU] via SUBCUTANEOUS

## 2022-08-15 MED ORDER — INSULIN ASPART 100 UNIT/ML IJ SOLN: 0.0000 [IU] | Freq: Every day | INTRAMUSCULAR | Status: DC

## 2022-08-15 MED ORDER — MELATONIN 5 MG PO TABS: 5.0000 mg | ORAL_TABLET | Freq: Every evening | ORAL | Status: DC | PRN

## 2022-08-15 MED ORDER — CITALOPRAM HYDROBROMIDE 10 MG PO TABS
20.0000 mg | ORAL_TABLET | Freq: Every day | ORAL | Status: DC
  Administered 2022-08-16: 20 mg via ORAL
  Filled 2022-08-15: qty 2

## 2022-08-15 MED ORDER — SIMVASTATIN 20 MG PO TABS
40.0000 mg | ORAL_TABLET | Freq: Every day | ORAL | Status: DC
  Administered 2022-08-16: 40 mg via ORAL
  Filled 2022-08-15: qty 2

## 2022-08-15 MED ORDER — FOLIC ACID 1 MG PO TABS
1.0000 mg | ORAL_TABLET | Freq: Every day | ORAL | Status: DC
  Administered 2022-08-16: 1 mg via ORAL
  Filled 2022-08-15: qty 1

## 2022-08-15 NOTE — H&P (Addendum)
History and Physical  [name] [name] [id number] DOB: Jan 03, 1929 DOA: 08/15/2022  Referring physician: Dr. Philip Aspen, [place]  PCP: [name], MD  Outpatient Specialists: Pulmonology, cardiology, podiatry. Patient coming from: Home  Chief Complaint: Altered mental status.  HPI: [name] [name] is a 88 y.o. male with medical history significant for dementia, history of CVA, type 2 diabetes, hyperlipidemia, OSA, chronic diastolic CHF, who presented to [facility] ED from home due to confusion, more confused than baseline since last night.  Associated with some agitation earlier today.  No family members at bedside.  Unable to obtain a history from the patient due to confusion.  History is mainly obtained from EDP and review of medical records.    At bedside, the patient is alert and oriented to self and place.  Denies having any pain.  No nausea.  No cough.  No shortness of breath.  Denies palpitations.  CT head nonacute.  UA negative.  Chest x-ray nonacute.  EDP requested admission for observation due to change in mental status.  The patient was admitted by [facility], hospitalist service.  ED Course: Tmax 97.8.  BP 143/74, pulse 89, respiratory 17, O2 saturation 100% on room air.  Lab studies remarkable for serum glucose 139.  WBC 7.4, hemoglobin 11.3, MCV 101.8.  Review of Systems: Review of systems as noted in the HPI. All other systems reviewed and are negative.   Past Medical History:  Diagnosis Date   Acute CVA (cerebrovascular accident) ([place]) [id number]   Acute metabolic encephalopathy [id number]   Anxiety    CAP (community acquired pneumonia) 04/04/2017   Change in bowel habits 11/06/2016   Dementia ([place])    Diabetes mellitus    Diabetes mellitus ([place]) 04/04/2017   Essential tremor 08/20/2015   Hyperlipidemia    Late onset Alzheimer's disease without behavioral disturbance ([place]) 10/13/2018   MCI (mild cognitive impairment) with memory loss 08/20/2015   Memory loss 02/13/2015    Obesity    OBSTRUCTIVE SLEEP APNEA 07/19/2008   Qualifier: Diagnosis of  By: [name] LPN, [name]     Osteoarthritis of knee 10/01/2017   PNA (pneumonia)    Sleep apnea    uses CPAP at night to sleep   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   Stroke (cerebrum) ([facility])    Stroke ([facility])    Tremor, essential [id number]   Umbilical hernia without obstruction and without gangrene 11/06/2016   Vision loss, central, left 10/13/2018   Past Surgical History:  Procedure Laterality Date   cipap     HERNIA REPAIR Right    REPLACEMENT TOTAL KNEE Left    Dr Marissa Nestle / Dr Henderson Newcomer RESECTION OF PROSTATE      Social History:  reports that he has never smoked. He has never used smokeless tobacco. He reports that he does not drink alcohol and does not use drugs.   Allergies  Allergen Reactions   Donepezil Shortness Of Breath, Swelling and Other (See Comments)    Other reaction(s): shortness of breath   Lisinopril Anaphylaxis, Shortness Of Breath and Swelling    HAD TO COME TO THE E.D. AFTER TAKING THIS!! Other reaction(s): angioedema   Methocarbamol Anaphylaxis, Shortness Of Breath and Swelling    HAD TO COME TO THE E.D. AFTER TAKING THIS!! Other reaction(s): OTHER REACTION, swelling   Namenda [Memantine Hcl] Shortness Of Breath and Swelling   Other     Other reaction(s): shortness of breath    Family History  Problem Relation Age of Onset   Liver disease Mother  Cancer - Lung Father        smoker      Prior to Admission medications   Medication Sig Start Date End Date Taking? Authorizing Provider  ACCU-CHEK SMARTVIEW test strip USE ONCE A DAY AS DIRECTED 02/17/18   [provider]  aspirin EC 325 MG EC tablet Take 1 tablet (325 mg total) by mouth daily. 04/06/17   Rai, Delene Ruffini, MD  B-D ULTRAFINE III SHORT PEN 31G X 8 MM MISC  12/28/14   [provider]  Cholecalciferol (VITAMIN D-3 PO) Take 1,000 Units by mouth daily.     [provider]  citalopram (CELEXA) 20 MG  tablet TAKE 1 TABLET (20 MG TOTAL) BY MOUTH DAILY. 08/04/16   Dohmeier, Porfirio Mylar, MD  Continuous Blood Gluc Sensor (FREESTYLE LIBRE 2 SENSOR) MISC See admin instructions. 01/06/21   [provider]  doxazosin (CARDURA) 4 MG tablet Take 4 mg by mouth daily.    [provider]  folic acid (FOLVITE) 800 MCG tablet Take 800 mcg by mouth daily.    [provider]  guaiFENesin-dextromethorphan (ROBITUSSIN DM) 100-10 MG/5ML syrup Take 5 mLs by mouth every 4 (four) hours as needed for cough. Patient not taking: Reported on 07/18/2021 04/06/17   Rai, Delene Ruffini, MD  insulin glargine (LANTUS) 100 UNIT/ML injection Inject 14 Units into the skin daily after supper.    [provider]  loratadine-pseudoephedrine (CLARITIN-D 24-HOUR) 10-240 MG per 24 hr tablet Take 1 tablet by mouth daily as needed for allergies.    [provider]  metFORMIN (GLUCOPHAGE) 1000 MG tablet Take 1,000 mg by mouth 2 (two) times daily with a meal.    [provider]  methotrexate (RHEUMATREX) 2.5 MG tablet Take by mouth. 12/18/11   [provider]  Multiple Vitamin (MULTIVITAMIN WITH MINERALS) TABS Take 1 tablet by mouth daily.    [provider]  omeprazole (PRILOSEC OTC) 20 MG tablet 1 tablet 30 minutes before morning meal Patient not taking: Reported on 07/18/2021 12/04/20   [provider]  omeprazole (PRILOSEC) 40 MG capsule Take 40 mg by mouth daily. 12/06/14   [provider]  simvastatin (ZOCOR) 40 MG tablet Take 40 mg by mouth daily. 08/03/15   [provider]  simvastatin (ZOCOR) 40 MG tablet Take 1 tablet by mouth daily.    [provider]    Physical Exam: BP 116/72   Pulse (!) 56   Temp 97.6 F (36.4 C)   Resp 16   Wt 80.3 kg   SpO2 97%   BMI 26.14 kg/m   General: 88 y.o. year-old male well developed well nourished in no acute distress.  Alert and confused. Cardiovascular: Regular rate and rhythm with no rubs or  gallops.  No thyromegaly or JVD noted.  No lower extremity edema. 2/4 pulses in all 4 extremities. Respiratory: Clear to auscultation with no wheezes or rales. Good inspiratory effort. Abdomen: Soft nontender nondistended with normal bowel sounds x4 quadrants. Muskuloskeletal: No cyanosis, clubbing or edema noted bilaterally Neuro: CN II-XII intact, strength, sensation, reflexes Skin: No ulcerative lesions noted or rashes Psychiatry: Judgement and insight appear normal. Mood is appropriate for condition and setting          Labs on Admission:  Basic Metabolic Panel: Recent Labs  Lab 08/15/22 1259  NA 140  K 4.3  CL 102  CO2 24  GLUCOSE 139*  BUN 21  CREATININE 1.14  CALCIUM 9.9   Liver Function Tests: Recent Labs  Lab 08/15/22 1259  AST 15  ALT 10  ALKPHOS 39  BILITOT 0.6  PROT 6.2*  ALBUMIN 3.6   No results for input(s): "LIPASE", "AMYLASE" in the last 168 hours. No results for input(s): "AMMONIA" in the last 168 hours. CBC: Recent Labs  Lab 08/15/22 1259  WBC 7.4  HGB 11.3*  HCT 34.4*  MCV 101.8*  PLT 207   Cardiac Enzymes: No results for input(s): "CKTOTAL", "CKMB", "CKMBINDEX", "TROPONINI" in the last 168 hours.  BNP (last 3 results) No results for input(s): "BNP" in the last 8760 hours.  ProBNP (last 3 results) No results for input(s): "PROBNP" in the last 8760 hours.  CBG: Recent Labs  Lab 08/15/22 1421 08/15/22 1659  GLUCAP 136* 118*    Radiological Exams on Admission: CT Head Wo Contrast  Result Date: 08/15/2022 CLINICAL DATA:  Altered level of consciousness, agitation, dimension EXAM: CT HEAD WITHOUT CONTRAST TECHNIQUE: Contiguous axial images were obtained from the base of the skull through the vertex without intravenous contrast. RADIATION DOSE REDUCTION: This exam was performed according to the departmental dose-optimization program which includes automated exposure control, adjustment of the mA and/or kV according to patient size and/or  use of iterative reconstruction technique. COMPARISON:  10/09/2021 FINDINGS: Brain: Chronic small-vessel ischemic changes throughout the periventricular white matter and left basal ganglia, stable. No evidence of acute infarct or hemorrhage. Lateral ventricles and remaining midline structures are stable. No acute extra-axial fluid collections. No mass effect. Diffuse cerebral atrophy again noted. Vascular: Stable atherosclerosis.  No hyperdense vessel. Skull: Normal. Negative for fracture or focal lesion. Sinuses/Orbits: No acute finding. Other: None. IMPRESSION: 1. Stable head CT, no acute intracranial process. Electronically Signed   By: Randa Ngo M.D.   On: 08/15/2022 18:23   DG Chest 2 View  Result Date: 08/15/2022 CLINICAL DATA:  Altered mental status EXAM: CHEST - 2 VIEW COMPARISON:  07/18/2021 FINDINGS: Cardiac silhouette unremarkable. No pneumothorax or pleural effusion identified. There is minimal atelectasis or consolidation of the left base. Lungs are otherwise clear. Normal pulmonary vasculature. Calcified aorta. IMPRESSION: Minimal left basilar consolidation or atelectasis. There was otherwise no acute cardiopulmonary process. Electronically Signed   By: Sammie Bench M.D.   On: 08/15/2022 13:20    EKG: I independently viewed the EKG done and my findings are as followed: Sinus rhythm with marked sinus arrhythmia with first-degree AV block.  Rate 68.  QTc 450.  Assessment/Plan Present on Admission:  AMS (altered mental status)  Principal Problem:   AMS (altered mental status)  Acute metabolic encephalopathy, unclear etiology Underlying dementia CT head, chest x-ray nonacute.  UA negative for pyuria. Afebrile with no leukocytosis. Gentle IV fluid hydration Reorient as needed  Type 2 diabetes with hyperglycemia Obtain hemoglobin A1c Hold off home oral hypoglycemics Start insulin sliding scale  Chronic anxiety/depression Resume home regimen  Hyperlipidemia Resume home  Zocor  History of CVA with ambulatory dysfunction PT OT assessment Fall precautions.    DVT prophylaxis: Subcu Lovenox daily  Code Status: Full code  Family Communication: None at bedside  Disposition Plan: Admitted to telemetry medical unit  Consults called: None.  Admission status: Observation status.   Status is: Observation    Kayleen Memos MD [facility] Pager [phone]  If 7PM-7AM, please contact night-coverage [email or web address] Password [email or web address]  08/15/2022, 10:05 PM

## 2022-08-15 NOTE — ED Provider Triage Note (Signed)
Emergency Medicine Provider Triage Evaluation Note  [name] [name] , a 88 y.o. male  was evaluated in triage.  Pt complains of per wife he was altered when waking up, was not really responded. When EMS arrived he was responding to voice but began to talk when [place].  Review of Systems  Positive:  Negative:   Physical Exam  SpO2 96%  Gen:   Awake, no distress   Resp:  Normal effort  MSK:   Moves extremities without difficulty  Other:    Medical Decision Making  Medically screening exam initiated at 12:40 PM.  Appropriate orders placed.  [name] [name] was informed that the remainder of the evaluation will be completed by another provider, this initial triage assessment does not replace that evaluation, and the importance of remaining in the ED until their evaluation is complete.  Patient here for altered mental status, wife provided history from EMS.  Upon chart review, I do see that patient was reported to have low oxygen levels when he had been at home.  Altered mental status workup was ordered.   Claude Manges, PA-C 08/15/22 1314

## 2022-08-15 NOTE — ED Notes (Signed)
ED PA at BS 

## 2022-08-15 NOTE — ED Notes (Signed)
To xray by w/c from triage

## 2022-08-15 NOTE — ED Notes (Signed)
Patient in & out cath, output

## 2022-08-15 NOTE — ED Notes (Signed)
Received verbal report from Ashley T RN at this time 

## 2022-08-15 NOTE — ED Provider Notes (Signed)
[facility] EMERGENCY DEPARTMENT Provider Note   CSN: [id number] Arrival date & time: 08/15/22  1235     History {Add pertinent medical, surgical, social history, OB history to HPI:1} Chief Complaint  Patient presents with   Altered Mental Status    [name] [name] is a 88 y.o. male.  Confused about day or forget if person came over to visit "Zoned out for an hour" Confused last night Said he had to go to work last night - hasn't worked that job in decades Was agitated earlier today Mild dementia  Hx of UTIs and urinary retention Uses walker and cane. Has been very weak - last month Hx of dementia       Home Medications Prior to Admission medications   Medication Sig Start Date End Date Taking? Authorizing Provider  ACCU-CHEK SMARTVIEW test strip USE ONCE A DAY AS DIRECTED 02/17/18   [provider]  aspirin EC 325 MG EC tablet Take 1 tablet (325 mg total) by mouth daily. 04/06/17   Rai, Delene Ruffini, MD  B-D ULTRAFINE III SHORT PEN 31G X 8 MM MISC  12/28/14   [provider]  Cholecalciferol (VITAMIN D-3 PO) Take 1,000 Units by mouth daily.     [provider]  citalopram (CELEXA) 20 MG tablet TAKE 1 TABLET (20 MG TOTAL) BY MOUTH DAILY. 08/04/16   Dohmeier, Porfirio Mylar, MD  Continuous Blood Gluc Sensor (FREESTYLE LIBRE 2 SENSOR) MISC See admin instructions. 01/06/21   [provider]  doxazosin (CARDURA) 4 MG tablet Take 4 mg by mouth daily.    [provider]  folic acid (FOLVITE) 800 MCG tablet Take 800 mcg by mouth daily.    [provider]  guaiFENesin-dextromethorphan (ROBITUSSIN DM) 100-10 MG/5ML syrup Take 5 mLs by mouth every 4 (four) hours as needed for cough. Patient not taking: Reported on 07/18/2021 04/06/17   Rai, Delene Ruffini, MD  insulin glargine (LANTUS) 100 UNIT/ML injection Inject 14 Units into the skin daily after supper.    [provider]  loratadine-pseudoephedrine (CLARITIN-D  24-HOUR) 10-240 MG per 24 hr tablet Take 1 tablet by mouth daily as needed for allergies.    [provider]  metFORMIN (GLUCOPHAGE) 1000 MG tablet Take 1,000 mg by mouth 2 (two) times daily with a meal.    [provider]  methotrexate (RHEUMATREX) 2.5 MG tablet Take by mouth. 12/18/11   [provider]  Multiple Vitamin (MULTIVITAMIN WITH MINERALS) TABS Take 1 tablet by mouth daily.    [provider]  omeprazole (PRILOSEC OTC) 20 MG tablet 1 tablet 30 minutes before morning meal Patient not taking: Reported on 07/18/2021 12/04/20   [provider]  omeprazole (PRILOSEC) 40 MG capsule Take 40 mg by mouth daily. 12/06/14   [provider]  simvastatin (ZOCOR) 40 MG tablet Take 40 mg by mouth daily. 08/03/15   [provider]  simvastatin (ZOCOR) 40 MG tablet Take 1 tablet by mouth daily.    [provider]      Allergies    Donepezil, Lisinopril, Methocarbamol, Namenda [memantine hcl], and Other    Review of Systems   Review of Systems  Physical Exam Updated Vital Signs BP 127/89   Pulse (!) 54   Temp 97.8 F (36.6 C)   Resp 18   Wt 80.3 kg   SpO2 100%   BMI 26.14 kg/m  Physical Exam Constitutional:      Comments: Oriented to self and place only  ED Results / Procedures / Treatments   Labs (all labs ordered are listed, but only abnormal results are displayed) Labs Reviewed  COMPREHENSIVE METABOLIC PANEL - Abnormal; Notable for the following components:      Result Value   Glucose, Bld 139 (*)    Total Protein 6.2 (*)    All other components within normal limits  CBC - Abnormal; Notable for the following components:   RBC 3.38 (*)    Hemoglobin 11.3 (*)    HCT 34.4 (*)    MCV 101.8 (*)    All other components within normal limits  CBG MONITORING, ED - Abnormal; Notable for the following components:   Glucose-Capillary 136 (*)    All other components within normal limits  CBG MONITORING, ED -  Abnormal; Notable for the following components:   Glucose-Capillary 118 (*)    All other components within normal limits  URINALYSIS, ROUTINE W REFLEX MICROSCOPIC    EKG None  Radiology DG Chest 2 View  Result Date: 08/15/2022 CLINICAL DATA:  Altered mental status EXAM: CHEST - 2 VIEW COMPARISON:  07/18/2021 FINDINGS: Cardiac silhouette unremarkable. No pneumothorax or pleural effusion identified. There is minimal atelectasis or consolidation of the left base. Lungs are otherwise clear. Normal pulmonary vasculature. Calcified aorta. IMPRESSION: Minimal left basilar consolidation or atelectasis. There was otherwise no acute cardiopulmonary process. Electronically Signed   By: Layla Maw M.D.   On: 08/15/2022 13:20    Procedures Procedures  {Document cardiac monitor, telemetry assessment procedure when appropriate:1}  Medications Ordered in ED Medications - No data to display  ED Course/ Medical Decision Making/ A&P                           Medical Decision Making  ***  {Document critical care time when appropriate:1} {Document review of labs and clinical decision tools ie heart score, Chads2Vasc2 etc:1}  {Document your independent review of radiology images, and any outside records:1} {Document your discussion with family members, caretakers, and with consultants:1} {Document social determinants of health affecting pt's care:1} {Document your decision making why or why not admission, treatments were needed:1} Final Clinical Impression(s) / ED Diagnoses Final diagnoses:  None    Rx / DC Orders ED Discharge Orders     None

## 2022-08-15 NOTE — ED Triage Notes (Signed)
Pt BIB [facility] from home per wife pt got up at 11 and was super agitated and not talking to her. EMS stated when they first arrived he was responsive to voice and then once they arrived at the bridge pt started talking asking why he was here. Pt has a hx of dementia.

## 2022-08-16 DIAGNOSIS — R404 Transient alteration of awareness: Secondary | ICD-10-CM | POA: Diagnosis not present

## 2022-08-16 LAB — BASIC METABOLIC PANEL
Anion gap: 9 (ref 5–15)
BUN: 17 mg/dL (ref 8–23)
CO2: 25 mmol/L (ref 22–32)
Calcium: 9.4 mg/dL (ref 8.9–10.3)
Chloride: 104 mmol/L (ref 98–111)
Creatinine, Ser: 0.97 mg/dL (ref 0.61–1.24)
GFR, Estimated: >60 mL/min (ref >60)
Glucose, Bld: 102 mg/dL — ABNORMAL HIGH (ref 70–99)
Potassium: 4 mmol/L (ref 3.5–5.1)
Sodium: 138 mmol/L (ref 135–145)

## 2022-08-16 LAB — GLUCOSE, CAPILLARY
Glucose-Capillary: 97 mg/dL (ref 70–99)
Glucose-Capillary: 178 mg/dL — ABNORMAL HIGH (ref 70–99)

## 2022-08-16 LAB — CBC
HCT: 33.3 % — ABNORMAL LOW (ref 39.0–52.0)
Hemoglobin: 11.1 g/dL — ABNORMAL LOW (ref 13.0–17.0)
MCH: 33.4 pg (ref 26.0–34.0)
MCHC: 33.3 g/dL (ref 30.0–36.0)
MCV: 100.3 fL — ABNORMAL HIGH (ref 80.0–100.0)
Platelets: 201 10*3/uL (ref 150–400)
RBC: 3.32 MIL/uL — ABNORMAL LOW (ref 4.22–5.81)
RDW: 13.6 % (ref 11.5–15.5)
WBC: 8.6 10*3/uL (ref 4.0–10.5)
nRBC: 0 % (ref 0.0–0.2)

## 2022-08-16 LAB — PHOSPHORUS: Phosphorus: 3.3 mg/dL (ref 2.5–4.6)

## 2022-08-16 LAB — MAGNESIUM: Magnesium: 1.5 mg/dL — ABNORMAL LOW (ref 1.7–2.4)

## 2022-08-16 NOTE — TOC Transition Note (Signed)
Transition of Care [facility]) - CM/SW Discharge Note   Patient Details  Name: [name] [name] MRN: [id number] Date of Birth: 1929/09/09  [facility] CM/SW Contact:  Lawerance Sabal, RN Phone Number: 08/16/2022, 2:05 PM   Clinical Narrative:     Sherron Monday w patient's daughter, Eunice Blase, over the phone. She is agreeable to [facility] services with [facility], referral placed. They would like to have WC shipped to the house. This was ordered through [employer] who will have it to the house Monday or Tuesday.  No other TOC needs identified at this time  Final next level of care: Home w Home Health Services Barriers to Discharge: No Barriers Identified   Patient Goals and CMS Choice   CMS Medicare.gov Compare Post Acute Care list provided to:: Other (Comment Required) Choice offered to / list presented to : Adult Children  Discharge Placement                       Discharge Plan and Services                DME Arranged: Lightweight manual wheelchair with seat cushion DME Agency: [employer] Date DME Agency Contacted: 08/16/22 Time DME Agency Contacted: [phone] Representative spoke with at DME Agency: Vonna Kotyk HH Arranged: PT, OT [facility] Agency: [facility] Date [facility] Agency Contacted: 08/16/22 Time HH Agency Contacted: 1403 Representative spoke with at [facility] Agency: Laurelyn Sickle  Social Determinants of Health (SDOH) Interventions     Readmission Risk Interventions     No data to display

## 2022-08-16 NOTE — Care Management Obs Status (Deleted)
MEDICARE OBSERVATION STATUS NOTIFICATION   Patient Details  Name: [name] [name] MRN: [id number] Date of Birth: August 26, 1929   Medicare Observation Status Notification Given:  Yes    Lawerance Sabal, RN 08/16/2022, 1:30 PM

## 2022-08-16 NOTE — Evaluation (Signed)
Physical Therapy Evaluation Patient Details Name: [name] [name] MRN: [id number] DOB: December 30, 1928 Today's Date: 08/16/2022  History of Present Illness  [name] [name] is a 88 y.o. male with medical history significant for dementia, history of CVA, type 2 diabetes, hyperlipidemia, OSA, chronic diastolic CHF, who presented to [facility] ED from home due to confusion, more confused than baseline since last night.  Associated with some agitation earlier today.   Clinical Impression  [name] [name] is 88 y.o. male admitted with above HPI and diagnosis. Patient is currently limited by functional impairments below (see PT problem list). Patient lives wife and requires min assist from spouse for all mobility with use of RW and assist for all ADL's at baseline. Confirmed PLOF from spouse via phone call. Pt required min assist for transfers and gait with RW this session. He has a home aid who bathes him 1x/week and home RN check on him 3x/week. Patient will benefit from continued skilled PT interventions to address impairments and progress independence with mobility, recommending HHPT services for strengthening and balance training. Acute PT will follow and progress as able.        Recommendations for follow up therapy are one component of a multi-disciplinary discharge planning process, led by the attending physician.  Recommendations may be updated based on patient status, additional functional criteria and insurance authorization.  Follow Up Recommendations Follow physician's recommendations for discharge plan and follow up therapies      Assistance Recommended at Discharge Frequent or constant Supervision/Assistance  Patient can return home with the following  A little help with walking and/or transfers;A little help with bathing/dressing/bathroom;Assistance with cooking/housework;Assistance with feeding;Direct supervision/assist for medications management;Assist for transportation;Help  with stairs or ramp for entrance    Equipment Recommendations None recommended by PT  Recommendations for Other Services       Functional Status Assessment Patient has had a recent decline in their functional status and demonstrates the ability to make significant improvements in function in a reasonable and predictable amount of time.     Precautions / Restrictions Precautions Precautions: Fall Restrictions Weight Bearing Restrictions: No      Mobility  Bed Mobility Overal bed mobility: Needs Assistance Bed Mobility: Supine to Sit, Sit to Supine     Supine to sit: Min guard, HOB elevated Sit to supine: Min guard, HOB elevated    guarding for safety, pt takign extra time.     Transfers Overall transfer level: Needs assistance Equipment used: Rolling walker (2 wheels) Transfers: Sit to/from Stand Sit to Stand: Min assist        Assist to power up from EOB, pt reliant on bil UE to initiate stand. cues to wait to feel bed with LE's prior to sitting.         Ambulation/Gait Ambulation/Gait assistance: Min assist Gait Distance (Feet): 30 Feet Assistive device: Rolling walker (2 wheels) Gait Pattern/deviations: Step-through pattern, Decreased step length - right, Decreased step length - left, Decreased stride length, Shuffle, Trunk flexed, Drifts right/left Gait velocity: decr    Min assist to steady walker and pt, cues to maintain safe proixmity to RW.     Stairs            Wheelchair Mobility    Modified Rankin (Stroke Patients Only)       Balance Overall balance assessment: Needs assistance Sitting-balance support: Feet supported Sitting balance-Leahy Scale: Fair     Standing balance support: Bilateral upper extremity supported, During functional activity, Reliant on assistive device for  balance Standing balance-Leahy Scale: Poor                               Pertinent Vitals/Pain Pain Assessment Pain Assessment: No/denies  pain Breathing: normal Negative Vocalization: none Facial Expression: smiling or inexpressive Body Language: relaxed Consolability: no need to console PAINAD Score: 0    Home Living Family/patient expects to be discharged to:: Private residence Living Arrangements: Spouse/significant other Available Help at Discharge: Family;Available 24 hours/day Type of Home: House           Home Equipment: Rolling Walker (2 wheels) Additional Comments: has a caregiver 3c mornings/week and checks him. and a hospice who    Prior Function Prior Level of Function : Needs assist       Physical Assist : Mobility (physical);ADLs (physical) Mobility (physical): Transfers;Gait;Bed mobility ADLs (physical): Bathing;Dressing;Toileting;Grooming         Hand Dominance        Extremity/Trunk Assessment   Upper Extremity Assessment Upper Extremity Assessment: Generalized weakness    Lower Extremity Assessment Lower Extremity Assessment: Generalized weakness    Cervical / Trunk Assessment Cervical / Trunk Assessment: Kyphotic  Communication   Communication: HOH  Cognition Arousal/Alertness: Awake/alert Behavior During Therapy: WFL for tasks assessed/performed Overall Cognitive Status: (P) History of cognitive impairments - at baseline    Disoriented to;Time (pt able to state in hospital and that he came because he was confused)  Decreased short-term memory  Follows one step commands with increased time;Follows one step commands consistently  Slow processing;Difficulty sequencing;Requires verbal cues       General Comments      Exercises     Assessment/Plan    PT Assessment Patient needs continued PT services  PT Problem List Decreased strength;Decreased activity tolerance;Decreased balance;Decreased mobility;Decreased cognition;Decreased knowledge of use of DME;Decreased safety awareness;Decreased knowledge of precautions       PT Treatment Interventions DME  instruction;Therapeutic exercise;Therapeutic activities;Functional mobility training;Stair training;Gait training;Balance training;Patient/family education    PT Goals (Current goals can be found in the Care Plan section)  Acute Rehab PT Goals Patient Stated Goal: get home PT Goal Formulation: With patient/family Time For Goal Achievement: 08/30/22 Potential to Achieve Goals: Good    Frequency Min 3X/week     Co-evaluation               AM-PAC PT "6 Clicks" Mobility  Outcome Measure Help needed turning from your back to your side while in a flat bed without using bedrails?: A Little Help needed moving from lying on your back to sitting on the side of a flat bed without using bedrails?: A Little Help needed moving to and from a bed to a chair (including a wheelchair)?: A Little Help needed standing up from a chair using your arms (e.g., wheelchair or bedside chair)?: A Little Help needed to walk in hospital room?: A Little Help needed climbing 3-5 steps with a railing? : A Lot 6 Click Score: 17    End of Session Equipment Utilized During Treatment: Gait belt Activity Tolerance: Patient tolerated treatment well Patient left: in bed;with call bell/phone within reach;with bed alarm set Nurse Communication: Mobility status PT Visit Diagnosis: Muscle weakness (generalized) (M62.81);Difficulty in walking, not elsewhere classified (R26.2);Unsteadiness on feet (R26.81)    Time: 7564-3329 PT Time Calculation (min) (ACUTE ONLY): 22 min   Charges:   PT Evaluation $PT Eval Moderate Complexity: 1 Mod  Wynn Maudlin, DPT Acute Rehabilitation Services Office [phone]  08/16/22 10:04 AM

## 2022-08-16 NOTE — Plan of Care (Signed)
  Problem: Fluid Volume: Goal: Ability to maintain a balanced intake and output will improve Outcome: Progressing   Problem: Education: Goal: Ability to describe self-care measures that may prevent or decrease complications (Diabetes Survival Skills Education) will improve Outcome: Not Progressing   Problem: Coping: Goal: Ability to adjust to condition or change in health will improve Outcome: Not Progressing

## 2022-08-16 NOTE — Care Management (Cosign Needed)
    Durable Medical Equipment  (From admission, onward)           Start     Ordered   08/16/22 1359  For home use only DME lightweight manual wheelchair with seat cushion  Once       Comments: Patient suffers from weakness which impairs their ability to perform daily activities like bathing and dressing in the home.  A walker will not resolve  issue with performing activities of daily living. A wheelchair will allow patient to safely perform daily activities. Patient is not able to propel themselves in the home using a standard weight wheelchair due to general weakness. Patient can self propel in the lightweight wheelchair. Length of need Lifetime. Accessories: elevating leg rests (ELRs), wheel locks, extensions and anti-tippers.   08/16/22 1358

## 2022-08-16 NOTE — Discharge Summary (Signed)
Physician Discharge Summary  Anthone Prieur PNT:[id number] DOB: Dec 25, 1928 DOA: 08/15/2022  PCP: [name], MD  Admit date: 08/15/2022 Discharge date: 08/16/2022  Admitted From: Home Disposition: Home  Recommendations for Outpatient Follow-up:  Follow up with PCP in 1-2 weeks  Home Health: None Equipment/Devices: No new equipment required  Discharge Condition: Stable CODE STATUS: Full Diet recommendation: Diabetic diet as tolerated  Brief/Interim Summary: Sanuel Ladnier is a 88 y.o. male with medical history significant for dementia, history of CVA, type 2 diabetes, hyperlipidemia, OSA, chronic diastolic CHF, who presented to [facility] ED from home due to confusion, more confused than baseline since last night.  Associated with some agitation earlier today.  No family members at bedside.  Unable to obtain a history from the patient due to confusion.  History is mainly obtained from EDP and review of medical records.     At bedside, the patient is alert and oriented to self and place and appears to be back to baseline.  Patient's imaging is otherwise unremarkable as below, no current complaints concerning for infectious process, cultures obtained pending preliminary negative.  Patient's white count within normal limits, afebrile.  At this time patient otherwise stable and agreeable for discharge home.  Follow-up with PCP in the next 1 to 2 weeks for posthospital visit and repeat lab work as indicated.  Certainly concern for polypharmacy given patient's age and medication list as below but no changes during hospitalization.  Acute metabolic encephalopathy on chronic baseline dementia, unclear etiology Underlying dementia CT head, chest x-ray nonacute.  UA negative for pyuria. Afebrile with no leukocytosis. Gentle IV fluid hydration Reorient as needed   Type 2 diabetes with hyperglycemia -Well-controlled, continue home regimen   Chronic anxiety/depression Resume home  regimen   Hyperlipidemia Resume home simvastatin   History of CVA with ambulatory dysfunction PT OT assessment -no follow-up indicated  Discharge Diagnoses:  Principal Problem:   AMS (altered mental status)    Discharge Instructions   Allergies as of 08/16/2022       Reactions   Donepezil Shortness Of Breath, Swelling, Other (See Comments)   Other reaction(s): shortness of breath   Lisinopril Anaphylaxis, Shortness Of Breath, Swelling   HAD TO COME TO THE E.D. AFTER TAKING THIS!! Other reaction(s): angioedema   Methocarbamol Anaphylaxis, Shortness Of Breath, Swelling   HAD TO COME TO THE E.D. AFTER TAKING THIS!! Other reaction(s): OTHER REACTION, swelling   Namenda [memantine Hcl] Shortness Of Breath, Swelling   Other    Other reaction(s): shortness of breath        Medication List     STOP taking these medications    guaiFENesin-dextromethorphan 100-10 MG/5ML syrup Commonly known as: ROBITUSSIN DM       TAKE these medications    Accu-Chek SmartView test strip Generic drug: glucose blood USE ONCE A DAY AS DIRECTED   aspirin EC 325 MG tablet Take 1 tablet (325 mg total) by mouth daily.   B-D ULTRAFINE III SHORT PEN 31G X 8 MM Misc Generic drug: Insulin Pen Needle   citalopram 20 MG tablet Commonly known as: CELEXA TAKE 1 TABLET (20 MG TOTAL) BY MOUTH DAILY.   doxazosin 4 MG tablet Commonly known as: CARDURA Take 4 mg by mouth daily.   folic acid 800 MCG tablet Commonly known as: FOLVITE Take 800 mcg by mouth daily.   FreeStyle [employer] 2 Sensor Misc See admin instructions.   insulin glargine 100 UNIT/ML injection Commonly known as: LANTUS Inject 14 Units into the skin  daily after supper.   loratadine-pseudoephedrine 10-240 MG 24 hr tablet Commonly known as: CLARITIN-D 24-hour Take 1 tablet by mouth daily as needed for allergies.   metFORMIN 1000 MG tablet Commonly known as: GLUCOPHAGE Take 1,000 mg by mouth 2 (two) times daily with a  meal.   methotrexate 2.5 MG tablet Commonly known as: RHEUMATREX Take by mouth.   multivitamin with minerals Tabs tablet Take 1 tablet by mouth daily.   Omeprazole 20 MG Tbec Take 40 mg by mouth daily.   simvastatin 40 MG tablet Commonly known as: ZOCOR Take 40 mg by mouth daily.   VITAMIN D-3 PO Take 1,000 Units by mouth daily.        Allergies  Allergen Reactions   Donepezil Shortness Of Breath, Swelling and Other (See Comments)    Other reaction(s): shortness of breath   Lisinopril Anaphylaxis, Shortness Of Breath and Swelling    HAD TO COME TO THE E.D. AFTER TAKING THIS!! Other reaction(s): angioedema   Methocarbamol Anaphylaxis, Shortness Of Breath and Swelling    HAD TO COME TO THE E.D. AFTER TAKING THIS!! Other reaction(s): OTHER REACTION, swelling   Namenda [Memantine Hcl] Shortness Of Breath and Swelling   Other     Other reaction(s): shortness of breath    Consultations: None  Procedures/Studies: CT Head Wo Contrast  Result Date: 08/15/2022 CLINICAL DATA:  Altered level of consciousness, agitation, dimension EXAM: CT HEAD WITHOUT CONTRAST TECHNIQUE: Contiguous axial images were obtained from the base of the skull through the vertex without intravenous contrast. RADIATION DOSE REDUCTION: This exam was performed according to the departmental dose-optimization program which includes automated exposure control, adjustment of the mA and/or kV according to patient size and/or use of iterative reconstruction technique. COMPARISON:  10/09/2021 FINDINGS: Brain: Chronic small-vessel ischemic changes throughout the periventricular white matter and left basal ganglia, stable. No evidence of acute infarct or hemorrhage. Lateral ventricles and remaining midline structures are stable. No acute extra-axial fluid collections. No mass effect. Diffuse cerebral atrophy again noted. Vascular: Stable atherosclerosis.  No hyperdense vessel. Skull: Normal. Negative for fracture or focal  lesion. Sinuses/Orbits: No acute finding. Other: None. IMPRESSION: 1. Stable head CT, no acute intracranial process. Electronically Signed   By: Sharlet Salina M.D.   On: 08/15/2022 18:23   DG Chest 2 View  Result Date: 08/15/2022 CLINICAL DATA:  Altered mental status EXAM: CHEST - 2 VIEW COMPARISON:  07/18/2021 FINDINGS: Cardiac silhouette unremarkable. No pneumothorax or pleural effusion identified. There is minimal atelectasis or consolidation of the left base. Lungs are otherwise clear. Normal pulmonary vasculature. Calcified aorta. IMPRESSION: Minimal left basilar consolidation or atelectasis. There was otherwise no acute cardiopulmonary process. Electronically Signed   By: Layla Maw M.D.   On: 08/15/2022 13:20     Subjective: No acute issues or events overnight   Discharge Exam: Vitals:   08/16/22 0720 08/16/22 1108  BP: (!) 151/80 108/67  Pulse: 71 69  Resp: 16 16  Temp: 98.2 F (36.8 C) 98.2 F (36.8 C)  SpO2: 97% 96%   Vitals:   08/16/22 0056 08/16/22 0343 08/16/22 0720 08/16/22 1108  BP:   (!) 151/80 108/67  Pulse:   71 69  Resp:  18 16 16   Temp: 98.6 F (37 C) 97.8 F (36.6 C) 98.2 F (36.8 C) 98.2 F (36.8 C)  TempSrc: Oral Oral Oral Oral  SpO2:   97% 96%  Weight:        General: Pt is alert, awake, not in acute distress Cardiovascular:  RRR, S1/S2 +, no rubs, no gallops Respiratory: CTA bilaterally, no wheezing, no rhonchi Abdominal: Soft, NT, ND, bowel sounds + Extremities: no edema, no cyanosis    The results of significant diagnostics from this hospitalization (including imaging, microbiology, ancillary and laboratory) are listed below for reference.     Microbiology: No results found for this or any previous visit (from the past 240 hour(s)).   Labs: BNP (last 3 results) No results for input(s): "BNP" in the last 8760 hours. Basic Metabolic Panel: Recent Labs  Lab 08/15/22 1259 08/16/22 0256  NA 140 138  K 4.3 4.0  CL 102 104  CO2 24  25  GLUCOSE 139* 102*  BUN 21 17  CREATININE 1.14 0.97  CALCIUM 9.9 9.4  MG  --  1.5*  PHOS  --  3.3   Liver Function Tests: Recent Labs  Lab 08/15/22 1259  AST 15  ALT 10  ALKPHOS 39  BILITOT 0.6  PROT 6.2*  ALBUMIN 3.6   No results for input(s): "LIPASE", "AMYLASE" in the last 168 hours. No results for input(s): "AMMONIA" in the last 168 hours. CBC: Recent Labs  Lab 08/15/22 1259 08/16/22 0256  WBC 7.4 8.6  HGB 11.3* 11.1*  HCT 34.4* 33.3*  MCV 101.8* 100.3*  PLT 207 201   Cardiac Enzymes: No results for input(s): "CKTOTAL", "CKMB", "CKMBINDEX", "TROPONINI" in the last 168 hours. BNP: Invalid input(s): "POCBNP" CBG: Recent Labs  Lab 08/15/22 1421 08/15/22 1659 08/15/22 2321 08/16/22 0600 08/16/22 1114  GLUCAP 136* 118* 105* 97 178*   D-Dimer No results for input(s): "DDIMER" in the last 72 hours. Hgb A1c No results for input(s): "HGBA1C" in the last 72 hours. Lipid Profile No results for input(s): "CHOL", "HDL", "LDLCALC", "TRIG", "CHOLHDL", "LDLDIRECT" in the last 72 hours. Thyroid function studies No results for input(s): "TSH", "T4TOTAL", "T3FREE", "THYROIDAB" in the last 72 hours.  Invalid input(s): "FREET3" Anemia work up No results for input(s): "VITAMINB12", "FOLATE", "FERRITIN", "TIBC", "IRON", "RETICCTPCT" in the last 72 hours. Urinalysis    Component Value Date/Time   COLORURINE YELLOW 08/15/2022 2022   APPEARANCEUR CLEAR 08/15/2022 2022   LABSPEC 1.016 08/15/2022 2022   PHURINE 5.0 08/15/2022 2022   GLUCOSEU NEGATIVE 08/15/2022 2022   HGBUR NEGATIVE 08/15/2022 2022   BILIRUBINUR NEGATIVE 08/15/2022 2022   KETONESUR NEGATIVE 08/15/2022 2022   PROTEINUR NEGATIVE 08/15/2022 2022   NITRITE NEGATIVE 08/15/2022 2022   LEUKOCYTESUR NEGATIVE 08/15/2022 2022   Sepsis Labs Recent Labs  Lab 08/15/22 1259 08/16/22 0256  WBC 7.4 8.6   Microbiology No results found for this or any previous visit (from the past 240 hour(s)).   Time  coordinating discharge: Over 30 minutes  SIGNED:   Azucena Fallen, DO [facility] 08/16/2022, 12:58 PM Pager   If 7PM-7AM, please contact night-coverage [email or web address]

## 2022-08-16 NOTE — Care Management CC44 (Deleted)
Condition Code 44 Documentation Completed  Patient Details  Name: [name] [name] MRN: [id number] Date of Birth: 11/02/1928   Condition Code 44 given:  Yes Patient signature on Condition Code 44 notice:  Yes Documentation of 2 MD's agreement:  Yes Code 44 added to claim:  Yes    Lawerance Sabal, RN 08/16/2022, 1:30 PM

## 2022-08-16 NOTE — Evaluation (Signed)
Occupational Therapy Evaluation Patient Details Name: [name] [name] MRN: [id number] DOB: 01/10/1929 Today's Date: 08/16/2022   History of Present Illness [name] [name] is a 88 y.o. male with medical history significant for dementia, history of CVA, type 2 diabetes, hyperlipidemia, OSA, chronic diastolic CHF, who presented to [name] ED from home due to confusion, more confused than baseline since last night.  Associated with some agitation earlier today.   Clinical Impression   Patient is currently requiring assistance with ADLs including minimal to light moderate assist with Lower body ADLs, minimal assist with Upper body ADLs,  as well as  minimal assist with bed mobility and minimal assist with functional transfers to toilet.  Current level of function may be below patient's typical baseline, however pt is along in room and an unreliable historian as he is oriented to self only.  During this evaluation, patient was limited by generalized weakness, impaired activity tolerance, and impaired motor planning/problem solving and cognition, all of which has the potential to impact patient's safety and independence during functional mobility, as well as performance for ADLs.  Patient lives at home, with spouse who is unable to provide 24/7 supervision and assistance, however pt has a PCA 3x/wk.  Patient demonstrates fair rehab potential, and should benefit from continued skilled occupational therapy services while in acute care to maximize safety, independence and quality of life at home.   ?      Recommendations for follow up therapy are one component of a multi-disciplinary discharge planning process, led by the attending physician.  Recommendations may be updated based on patient status, additional functional criteria and insurance authorization.   Follow Up Recommendations  Follow physician's recommendations for discharge plan and follow up therapies     Assistance Recommended at  Discharge Frequent or constant Supervision/Assistance  Patient can return home with the following A little help with walking and/or transfers;A little help with bathing/dressing/bathroom    Functional Status Assessment  Patient has had a recent decline in their functional status and demonstrates the ability to make significant improvements in function in a reasonable and predictable amount of time.  Equipment Recommendations   (Would need to confirm DME/AE at home from family)    Recommendations for Other Services       Precautions / Restrictions Precautions Precautions: Fall Restrictions Weight Bearing Restrictions: No      Mobility Bed Mobility Overal bed mobility: Needs Assistance Bed Mobility: Supine to Sit, Sit to Supine     Supine to sit: Min guard, HOB elevated Sit to supine: Min guard, HOB elevated        Transfers                          Balance Overall balance assessment: Needs assistance Sitting-balance support: Feet supported Sitting balance-Leahy Scale: Fair     Standing balance support: Bilateral upper extremity supported, During functional activity, Reliant on assistive device for balance Standing balance-Leahy Scale: Poor                             ADL either performed or assessed with clinical judgement   ADL Overall ADL's : Needs assistance/impaired Eating/Feeding: Independent;Bed level   Grooming: Wash/dry face;Oral care;Standing;Minimal assistance;Cueing for sequencing   Upper Body Bathing: Sitting;Min guard;Cueing for sequencing   Lower Body Bathing: Moderate assistance;Sit to/from stand;Sitting/lateral leans;Cueing for safety;Cueing for sequencing   Upper Body Dressing : Minimal assistance;Cueing for sequencing;Sitting  Upper Body Dressing Details (indicate cue type and reason): To don posterior gown while EOB Lower Body Dressing: Sitting/lateral leans;Minimal assistance Lower Body Dressing Details (indicate cue  type and reason): Doffed socks at EOB with supervision. Donned LT sock but required assistance to complete donning RT sock over heel due to fatigue. Increased time and effort. Toilet Transfer: Minimal assistance;Ambulation;Rolling walker (2 wheels);Cueing for sequencing;Cueing for safety Toilet Transfer Details (indicate cue type and reason): Pt stood from EOB with Min As (required RW held down for him as not following cues for hand placement. Pt ambulated to/from sink for grooming ~10' x2 with RW. Pt did initiate sitting too early and too far from bed requiring heavier Min As to ensure safety. Pt educated on sequencing to increase stand to sit safety but will likely need reinforced. Toileting- Clothing Manipulation and Hygiene: Moderate assistance Toileting - Clothing Manipulation Details (indicate cue type and reason): based on general assessment.     Functional mobility during ADLs: Minimal assistance;Min guard;Rolling walker (2 wheels);Cueing for safety;Cueing for sequencing       Vision Baseline Vision/History: 1 Wears glasses Ability to See in Adequate Light: 1 Impaired Patient Visual Report: No change from baseline       Perception     Praxis      Pertinent Vitals/Pain Pain Assessment Pain Assessment: No/denies pain     Hand Dominance Left   Extremity/Trunk Assessment Upper Extremity Assessment Upper Extremity Assessment: Generalized weakness   Lower Extremity Assessment Lower Extremity Assessment: Generalized weakness   Cervical / Trunk Assessment Cervical / Trunk Assessment: Other exceptions;Kyphotic Cervical / Trunk Exceptions: Very long toe nails. Could use a trim.   Communication Communication Communication: HOH;Expressive difficulties   Cognition Arousal/Alertness: Awake/alert Behavior During Therapy: WFL for tasks assessed/performed Overall Cognitive Status: No family/caregiver present to determine baseline cognitive functioning Area of Impairment:  Orientation, Memory, Following commands, Problem solving                 Orientation Level: Disoriented to, Place, Time, Situation   Memory: Decreased short-term memory Following Commands: Follows one step commands with increased time     Problem Solving: Slow processing, Difficulty sequencing General Comments: Oriented to self only. Able to state that he is from [place].     General Comments       Exercises     Shoulder Instructions      Home Living Family/patient expects to be discharged to:: Private residence Living Arrangements: Spouse/significant other Available Help at Discharge: Family;Available 24 hours/day;Personal care attendant Type of Home: House Home Access: Level entry     Home Layout: One level     Bathroom Shower/Tub: Producer, television/film/video: Standard Bathroom Accessibility: Yes How Accessible: Accessible via walker Home Equipment: Rolling Walker (2 wheels);Shower seat - built in;Hand held shower head   Additional Comments: has a caregiver 3 mornings/week and checks him.      Prior Functioning/Environment Prior Level of Function : Needs assist       Physical Assist : ADLs (physical);Mobility (physical) Mobility (physical): Transfers;Gait;Bed mobility ADLs (physical): Bathing;Dressing;Toileting;Grooming Mobility Comments: Reports assistance with bed mobility and transfers at home ADLs Comments: Pt reports his CG assists with bathing, dressing, toileting and grooming when  standing at sink.        OT Problem List: Decreased activity tolerance;Decreased strength;Decreased safety awareness;Decreased cognition;Decreased knowledge of use of DME or AE;Impaired balance (sitting and/or standing)      OT Treatment/Interventions: Self-care/ADL training;Therapeutic exercise;Therapeutic activities;Cognitive remediation/compensation;DME and/or AE instruction;Patient/family education;Balance  training    OT Goals(Current goals can be found  in the care plan section) Acute Rehab OT Goals OT Goal Formulation: Patient unable to participate in goal setting Time For Goal Achievement: 08/30/22 Potential to Achieve Goals: Fair ADL Goals Pt Will Perform Grooming: with supervision;standing Pt Will Transfer to Toilet: ambulating;with supervision Pt Will Perform Toileting - Clothing Manipulation and hygiene: with supervision;sitting/lateral leans;sit to/from stand Pt/caregiver will Perform Home Exercise Program: Increased strength;Both right and left upper extremity;With minimal assist;With Supervision  OT Frequency: Min 2X/week    Co-evaluation              AM-PAC OT "6 Clicks" Daily Activity     Outcome Measure Help from another person eating meals?: None Help from another person taking care of personal grooming?: A Little Help from another person toileting, which includes using toliet, bedpan, or urinal?: A Lot Help from another person bathing (including washing, rinsing, drying)?: A Lot Help from another person to put on and taking off regular upper body clothing?: A Little Help from another person to put on and taking off regular lower body clothing?: A Little 6 Click Score: 17   End of Session Equipment Utilized During Treatment: Gait belt;Rolling walker (2 wheels)  Activity Tolerance: Patient tolerated treatment well;No increased pain Patient left: in bed;with call bell/phone within reach;with bed alarm set  OT Visit Diagnosis: Unsteadiness on feet (R26.81);Muscle weakness (generalized) (M62.81)                Time: 1010-1033 OT Time Calculation (min): 23 min Charges:  OT General Charges $OT Visit: 1 Visit OT Evaluation $OT Eval Low Complexity: 1 Low OT Treatments $Self Care/Home Management : 8-22 mins  Victorino Dike, OT Acute Rehab Services Office: [phone] 08/16/2022  Theodoro Clock 08/16/2022, 10:45 AM

## 2022-08-18 LAB — HEMOGLOBIN A1C
Hgb A1c MFr Bld: 6.8 % — ABNORMAL HIGH (ref 4.8–5.6)
Mean Plasma Glucose: 148 mg/dL

## 2022-08-21 ENCOUNTER — Inpatient Hospital Stay: Payer: Medicare PPO | Admitting: Pulmonary Disease

## 2022-08-22 DIAGNOSIS — F0283 Dementia in other diseases classified elsewhere, unspecified severity, with mood disturbance: Secondary | ICD-10-CM | POA: Diagnosis not present

## 2022-08-22 DIAGNOSIS — I5032 Chronic diastolic (congestive) heart failure: Secondary | ICD-10-CM | POA: Diagnosis not present

## 2022-08-22 DIAGNOSIS — G9341 Metabolic encephalopathy: Secondary | ICD-10-CM | POA: Diagnosis not present

## 2022-08-22 DIAGNOSIS — F32A Depression, unspecified: Secondary | ICD-10-CM | POA: Diagnosis not present

## 2022-08-22 DIAGNOSIS — F0284 Dementia in other diseases classified elsewhere, unspecified severity, with anxiety: Secondary | ICD-10-CM | POA: Diagnosis not present

## 2022-08-22 DIAGNOSIS — E119 Type 2 diabetes mellitus without complications: Secondary | ICD-10-CM | POA: Diagnosis not present

## 2022-08-22 DIAGNOSIS — G301 Alzheimer's disease with late onset: Secondary | ICD-10-CM | POA: Diagnosis not present

## 2022-08-22 DIAGNOSIS — G250 Essential tremor: Secondary | ICD-10-CM | POA: Diagnosis not present

## 2022-08-22 DIAGNOSIS — F02811 Dementia in other diseases classified elsewhere, unspecified severity, with agitation: Secondary | ICD-10-CM | POA: Diagnosis not present

## 2022-09-04 ENCOUNTER — Encounter: Payer: Self-pay | Admitting: Nurse Practitioner

## 2022-09-04 ENCOUNTER — Ambulatory Visit: Payer: Medicare PPO | Admitting: Nurse Practitioner

## 2022-09-04 VITALS — BP 110/64 | HR 81 | Temp 99.0°F | Ht 69.0 in | Wt 177.0 lb

## 2022-09-04 DIAGNOSIS — J329 Chronic sinusitis, unspecified: Secondary | ICD-10-CM

## 2022-09-04 DIAGNOSIS — R0609 Other forms of dyspnea: Secondary | ICD-10-CM

## 2022-09-04 DIAGNOSIS — R0602 Shortness of breath: Secondary | ICD-10-CM | POA: Diagnosis not present

## 2022-09-04 DIAGNOSIS — G4733 Obstructive sleep apnea (adult) (pediatric): Secondary | ICD-10-CM

## 2022-09-04 MED ORDER — ALBUTEROL SULFATE HFA 108 (90 BASE) MCG/ACT IN AERS: 2.0000 | INHALATION_SPRAY | Freq: Four times a day (QID) | RESPIRATORY_TRACT | 2 refills | Status: DC | PRN

## 2022-09-04 MED ORDER — FLUTICASONE PROPIONATE 50 MCG/ACT NA SUSP: 2.0000 | Freq: Every day | NASAL | 2 refills | Status: DC

## 2022-09-04 NOTE — Assessment & Plan Note (Addendum)
Longstanding issue.  He has been marked in the past that dropped his oxygen.  Repeat walking oximetry today without desaturation.  Symptoms seem to worsen with bouts of confusion.  Likely multifactorial related to severe deconditioning, Alzheimer's dementia, chronic sinusitis, CHF. We discussed further workup, but given his age, frailty, and mental status, shared decision to refrain. He is a never smoker; questionable hx of asthma? Provided him with trial of prn SABA. Medication education provided. Encouraged him to continue working with PT on strength/conditioning.   Patient Instructions  Increase CPAP use to every night, minimum of 4-6 hours a night.  Change equipment every 30 days or as directed by DME. Wash your tubing with warm soap and water daily, hang to dry. Wash humidifier portion weekly.   Fluticasone nasal spray 2 sprays each nostril daily for nasal congestion/drainage Albuterol inhaler 2 puffs every 6 hours as needed for shortness of breath or wheezing.  Continue claritin 1 tab daily for allergies Continue omeprazole 1 tab daily  Follow up in 4 months with Dr. Wynona Neat. If symptoms do not improve or worsen, please contact office for sooner follow up or seek emergency care.

## 2022-09-04 NOTE — Patient Instructions (Addendum)
Increase CPAP use to every night, minimum of 4-6 hours a night.  Change equipment every 30 days or as directed by DME. Wash your tubing with warm soap and water daily, hang to dry. Wash humidifier portion weekly.   Fluticasone nasal spray 2 sprays each nostril daily for nasal congestion/drainage Albuterol inhaler 2 puffs every 6 hours as needed for shortness of breath or wheezing.  Continue claritin 1 tab daily for allergies Continue omeprazole 1 tab daily  Follow up in 4 months with Dr. Wynona Neat. If symptoms do not improve or worsen, please contact office for sooner follow up or seek emergency care.

## 2022-09-04 NOTE — Progress Notes (Signed)
@Patient  ID: , male    DOB: 09/20/1928, 88 y.o.   MRN: [id number]  Chief Complaint  Patient presents with   Hospitalization Follow-up    Wants to see if he qualifies for home oxygen.  They have the oxygen in the can.  She states it helps temporarily.    Referring provider: [id number], MD  HPI: 88 year old male, never smoker followed for OSA on CPAP. He is a patient of Dr. 82 and last seen in office 07/18/2021. Past medical history significant for HTN, hx of stroke, chronic sinusitis, GERD, DM, Alzheimer's dementia, CKD.  TEST/EVENTS:  02/03/2020 CTA chest: no evidence of PE. Lungs are clear. Cardiomegaly and atherosclerosis.  02/28/2020 echo: EF 55-60%, GIDD, RV size and function nl. Normal PASP. Trivial MR. AR trivial.   07/18/2022: OV with Dr. 13/11/2021. History of OSA. Dysfunctional machine. Declined repeat sleep study. Will try to restart CPAP therapy without one. Struggles with dyspnea and occasional O2 drops. Walking oximetry without desaturation; maintain >96%. Unable to ambulate much. Thought to be multifactorial and related to significant deconditioning. CXR nl.  09/04/2022: Today - follow up Patient presents today with his wife for overdue follow-up, who provides his history.  He was last seen a little over a year ago.  Recently admitted for altered mental status, felt to be related to polypharmacy. Recovered quickly and was discharged home a day later, on 12/2. He has struggled with dyspnea for many years now.  Previous imaging has been unrevealing. No previous PFTs. Has not wanted to undergo significant evaluation/workup.  Walking oximetry at last visit without desaturation.  His wife tells me today that he still is having trouble with shortness of breath, that seems to get worse in the evenings when he has his bouts of confusion.  Stable over the past year or longer. He is also not wearing his CPAP consistently.  Did wear it in the past 3 nights, with good  control.  He has restless sleep on the nights he does not wear it. Also snores. Has an occasional cough, which is chronic and related to his sinuses.  He does have a lot of postnasal drainage.  Not currently on any nasal sprays.  She is worried he needs oxygen. O2 levels at home are never below 90%. Does take Claritin daily.  Denies any wheezing, fevers, chills, hemoptysis, lower extremity swelling, orthopnea, PND.  He was on inhalers years ago but his wife is not sure what these were. No formal pulmonary history, aside from his OSA.   Allergies  Allergen Reactions   Donepezil Shortness Of Breath, Swelling and Other (See Comments)    Other reaction(s): shortness of breath   Lisinopril Anaphylaxis, Shortness Of Breath and Swelling    HAD TO COME TO THE E.D. AFTER TAKING THIS!! Other reaction(s): angioedema   Methocarbamol Anaphylaxis, Shortness Of Breath and Swelling    HAD TO COME TO THE E.D. AFTER TAKING THIS!! Other reaction(s): OTHER REACTION, swelling   Namenda [Memantine Hcl] Shortness Of Breath and Swelling   Other     Other reaction(s): shortness of breath    Immunization History  Administered Date(s) Administered   Influenza Split 06/19/2009   Influenza, High Dose Seasonal PF 08/05/2018   Influenza-Unspecified 10/13/2002, 07/06/2003, 09/11/2004, 06/29/2006, 06/14/2007, 08/23/2008, 06/15/2009, 06/28/2010, 06/16/2011   PFIZER(Purple Top)SARS-COV-2 Vaccination 12/05/2019, 12/26/2019   Pneumococcal Conjugate-13 08/08/2014   Pneumococcal Polysaccharide-23 11/14/2006   Pneumococcal-Unspecified 03/21/2002   Tdap 03/01/2015   Zoster, Live 09/15/2010    Past  Medical History:  Diagnosis Date   Acute CVA (cerebrovascular accident) (HCC) 04/05/2017   Acute metabolic encephalopathy 04/04/2017   Anxiety    CAP (community acquired pneumonia) 04/04/2017   Change in bowel habits 11/06/2016   Dementia (HCC)    Diabetes mellitus    Diabetes mellitus (HCC) 04/04/2017   Essential tremor  08/20/2015   Hyperlipidemia    Late onset Alzheimer's disease without behavioral disturbance (HCC) 10/13/2018   MCI (mild cognitive impairment) with memory loss 08/20/2015   Memory loss 02/13/2015   Obesity    OBSTRUCTIVE SLEEP APNEA 07/19/2008   Qualifier: Diagnosis of  By: Thad Ranger LPN, [name]     Osteoarthritis of knee 10/01/2017   PNA (pneumonia)    Sleep apnea    uses CPAP at night to sleep   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 30 tablet 4   B-D ULTRAFINE III SHORT PEN 31G X 8 MM MISC   1   Cholecalciferol (VITAMIN D-3 PO) Take 1,000 Units by mouth daily.      citalopram (CELEXA) 20 MG tablet TAKE 1 TABLET (20 MG TOTAL) BY MOUTH DAILY. 90 tablet 3   Continuous Blood Gluc Sensor (FREESTYLE LIBRE 2 SENSOR) MISC See admin instructions.     doxazosin (CARDURA) 4 MG tablet Take 4 mg by mouth daily.     folic acid (FOLVITE) 800 MCG tablet Take 800 mcg by mouth daily.     insulin glargine (LANTUS) 100 UNIT/ML injection Inject 14 Units into the skin daily after supper.     loratadine-pseudoephedrine (CLARITIN-D 24-HOUR) 10-240 MG per 24 hr tablet Take 1 tablet by mouth daily as needed for allergies.     metFORMIN (GLUCOPHAGE) 1000 MG tablet Take 1,000 mg by mouth 2 (two) times daily with a meal.     methotrexate (RHEUMATREX) 2.5 MG tablet Take by mouth.     Multiple Vitamin (MULTIVITAMIN WITH MINERALS) TABS Take 1 tablet by mouth daily.     raNITIdine HCl (ZANTAC PO) Take 1 tablet  by mouth daily.     simvastatin (ZOCOR) 40 MG tablet Take 40 mg by mouth daily.  0   Omeprazole 20 MG TBEC Take 40 mg by mouth daily. (Patient not taking: Reported on 09/04/2022)  0   No facility-administered medications prior to visit.     Review of Systems:   Constitutional: No weight loss or gain, night sweats, fevers, chills, fatigue, + lassitude (baseline) HEENT: No headaches, difficulty swallowing, tooth/dental problems, or sore throat. No sneezing, itching, ear ache, nasal congestion, or post nasal drip CV:  No chest pain, orthopnea, PND, swelling in lower extremities, anasarca, dizziness, palpitations, syncope Resp: +shortness of breath with exertion. No excess mucus or change in color of mucus. No productive or non-productive. No hemoptysis. No wheezing.  No chest wall deformity GI:  No heartburn, indigestion, abdominal pain, loss of appetite Skin: No rash, lesions, ulcerations MSK:  No joint pain or swelling.  Neuro: No dizziness or lightheadedness. +memory impairment Psych:  Mood stable. +sleep disturbance    Physical Exam:  BP 110/64 (BP Location: Left Arm, Patient Position: Sitting, Cuff Size: Normal)   Pulse 81   Temp 99 F (37.2 C) (Oral)   Ht 5\' 9"  (1.753 m)   Wt 177 lb (80.3 kg)   SpO2 98%   BMI 26.14 kg/m   GEN: Pleasant, interactive, elderly, frail; in no acute distress. HEENT:  Normocephalic and atraumatic. PERRLA. Sclera white. Nasal turbinates erythematous, moist and patent bilaterally. Clear rhinorrhea present. Oropharynx pink and moist, without exudate or edema. No lesions, ulcerations, or postnasal drip.  NECK:  Supple w/ fair ROM. No JVD present. Normal carotid impulses w/o bruits. Thyroid symmetrical with no goiter or nodules palpated. No lymphadenopathy.   CV: RRR, no m/r/g, no peripheral edema. Pulses intact, +2 bilaterally. No cyanosis, pallor or clubbing. PULMONARY:  Unlabored, regular breathing. Clear bilaterally A&P w/o wheezes/rales/rhonchi. No  accessory muscle use.  GI: BS present and normoactive. Soft, non-tender to palpation. No organomegaly or masses detected.  MSK: No erythema, warmth or tenderness. Cap refil <2 sec all extrem. No deformities or joint swelling noted.  Neuro: A/Ox3. No focal deficits noted.   Skin: Warm, no lesions or rashe Psych: Normal affect and behavior. Judgement and thought content appropriate.     Lab Results:  CBC    Component Value Date/Time   WBC 8.6 08/16/2022 0256   RBC 3.32 (L) 08/16/2022 0256   HGB 11.1 (L) 08/16/2022 0256   HCT 33.3 (L) 08/16/2022 0256   PLT 201 08/16/2022 0256   MCV 100.3 (H) 08/16/2022 0256   MCH 33.4 08/16/2022 0256   MCHC 33.3 08/16/2022 0256   RDW 13.6 08/16/2022 0256   LYMPHSABS 2.5 10/09/2021 1830   MONOABS 0.7 10/09/2021 1830   EOSABS 0.3 10/09/2021 1830   BASOSABS 0.0 10/09/2021 1830    BMET    Component Value Date/Time   NA 138 08/16/2022 0256   NA 139 02/02/2020 0945   K 4.0 08/16/2022 0256   CL 104 08/16/2022 0256   CO2 25 08/16/2022 0256   GLUCOSE 102 (H) 08/16/2022 0256   BUN 17 08/16/2022 0256   BUN 22 02/02/2020 0945   CREATININE 0.97 08/16/2022 0256   CALCIUM 9.4 08/16/2022 0256   GFRNONAA >60 08/16/2022 0256   GFRAA 57 (L) 02/02/2020 0945    BNP No results found for: "BNP"   Imaging:  CT Head Wo Contrast  Result Date: 08/15/2022 CLINICAL DATA:  Altered level of consciousness, agitation, dimension EXAM: CT HEAD WITHOUT CONTRAST TECHNIQUE: Contiguous axial images were obtained from the base of the skull through the vertex without intravenous contrast. RADIATION DOSE REDUCTION: This exam was performed according to the departmental dose-optimization program which includes automated exposure control, adjustment of the mA and/or kV according to patient size and/or use of iterative reconstruction technique. COMPARISON:  10/09/2021 FINDINGS: Brain: Chronic small-vessel ischemic changes throughout the periventricular white matter and left  basal ganglia, stable. No evidence of acute infarct or hemorrhage. Lateral ventricles and remaining midline structures are stable. No acute extra-axial fluid collections. No mass effect. Diffuse cerebral atrophy again noted. Vascular: Stable atherosclerosis.  No hyperdense vessel. Skull: Normal. Negative for fracture or focal lesion. Sinuses/Orbits: No acute finding. Other: None. IMPRESSION: 1. Stable head CT, no acute intracranial process. Electronically Signed   By: 10/11/2021 M.D.   On: 08/15/2022 18:23   DG Chest 2 View  Result Date: 08/15/2022 CLINICAL DATA:  Altered mental  status EXAM: CHEST - 2 VIEW COMPARISON:  07/18/2021 FINDINGS: Cardiac silhouette unremarkable. No pneumothorax or pleural effusion identified. There is minimal atelectasis or consolidation of the left base. Lungs are otherwise clear. Normal pulmonary vasculature. Calcified aorta. IMPRESSION: Minimal left basilar consolidation or atelectasis. There was otherwise no acute cardiopulmonary process. Electronically Signed   By: Layla MawJoshua  Pleasure M.D.   On: 08/15/2022 13:20          No data to display          No results found for: "NITRICOXIDE"      Assessment & Plan:   Dyspnea on exertion Longstanding issue.  He has been marked in the past that dropped his oxygen.  Repeat walking oximetry today without desaturation.  Symptoms seem to worsen with bouts of confusion.  Likely multifactorial related to severe deconditioning, Alzheimer's dementia, chronic sinusitis, CHF. We discussed further workup, but given his age, frailty, and mental status, shared decision to refrain. He is a never smoker; questionable hx of asthma? Provided him with trial of prn SABA. Medication education provided. Encouraged him to continue working with PT on strength/conditioning.   Patient Instructions  Increase CPAP use to every night, minimum of 4-6 hours a night.  Change equipment every 30 days or as directed by DME. Wash your tubing with  warm soap and water daily, hang to dry. Wash humidifier portion weekly.   Fluticasone nasal spray 2 sprays each nostril daily for nasal congestion/drainage Albuterol inhaler 2 puffs every 6 hours as needed for shortness of breath or wheezing.  Continue claritin 1 tab daily for allergies Continue omeprazole 1 tab daily  Follow up in 4 months with Dr. Wynona Neatlalere. If symptoms do not improve or worsen, please contact office for sooner follow up or seek emergency care.    Chronic sinusitis Possible cause/contributing factor to dyspnea. Vasomotor vs allergic? Also causes trouble with CPAP use at night. He is on daily antihistamine. We will add intranasal steroid, which he can use prior to application of CPAP.   OBSTRUCTIVE SLEEP APNEA Poor compliance with inconsistent use, likely related to dementia and chronic sinusitis. We discussed how untreated sleep apnea puts an individual at risk for cardiac arrhthymias, pulm HTN, DM, stroke and increases their risk for daytime accidents. Encouraged them to make sure he is using his CPAP nightly. He has excellent control when he does use it, based on download. He does not drive anymore so no risk for driving accidents associated with untreated OSA.   I spent 35 minutes of dedicated to the care of this patient on the date of this encounter to include pre-visit review of records, face-to-face time with the patient discussing conditions above, post visit ordering of testing, clinical documentation with the electronic health record, making appropriate referrals as documented, and communicating necessary findings to members of the patients care team.  Noemi ChapelKatherine V Nathanuel Cabreja, NP 09/04/2022  Pt aware and understands NP's role.

## 2022-09-04 NOTE — Assessment & Plan Note (Signed)
Possible cause/contributing factor to dyspnea. Vasomotor vs allergic? Also causes trouble with CPAP use at night. He is on daily antihistamine. We will add intranasal steroid, which he can use prior to application of CPAP.

## 2022-09-04 NOTE — Assessment & Plan Note (Signed)
Poor compliance with inconsistent use, likely related to dementia and chronic sinusitis. We discussed how untreated sleep apnea puts an individual at risk for cardiac arrhthymias, pulm HTN, DM, stroke and increases their risk for daytime accidents. Encouraged them to make sure he is using his CPAP nightly. He has excellent control when he does use it, based on download. He does not drive anymore so no risk for driving accidents associated with untreated OSA.

## 2022-09-05 DIAGNOSIS — R609 Edema, unspecified: Secondary | ICD-10-CM | POA: Diagnosis not present

## 2022-09-05 DIAGNOSIS — Z23 Encounter for immunization: Secondary | ICD-10-CM | POA: Diagnosis not present

## 2022-09-05 DIAGNOSIS — E1122 Type 2 diabetes mellitus with diabetic chronic kidney disease: Secondary | ICD-10-CM | POA: Diagnosis not present

## 2022-09-05 DIAGNOSIS — E782 Mixed hyperlipidemia: Secondary | ICD-10-CM | POA: Diagnosis not present

## 2022-09-05 DIAGNOSIS — L409 Psoriasis, unspecified: Secondary | ICD-10-CM | POA: Diagnosis not present

## 2022-09-05 DIAGNOSIS — N1831 Chronic kidney disease, stage 3a: Secondary | ICD-10-CM | POA: Diagnosis not present

## 2022-09-05 DIAGNOSIS — I6930 Unspecified sequelae of cerebral infarction: Secondary | ICD-10-CM | POA: Diagnosis not present

## 2022-09-05 DIAGNOSIS — I129 Hypertensive chronic kidney disease with stage 1 through stage 4 chronic kidney disease, or unspecified chronic kidney disease: Secondary | ICD-10-CM | POA: Diagnosis not present

## 2022-09-05 DIAGNOSIS — K219 Gastro-esophageal reflux disease without esophagitis: Secondary | ICD-10-CM | POA: Diagnosis not present

## 2022-10-29 ENCOUNTER — Encounter (HOSPITAL_COMMUNITY): Payer: Self-pay

## 2022-10-29 ENCOUNTER — Emergency Department (HOSPITAL_COMMUNITY): Payer: Medicare PPO

## 2022-10-29 ENCOUNTER — Other Ambulatory Visit: Payer: Self-pay

## 2022-10-29 ENCOUNTER — Emergency Department (HOSPITAL_COMMUNITY)
Admission: EM | Admit: 2022-10-29 | Discharge: 2022-10-29 | Disposition: A | Discharge status: Home / Self Care | Payer: Medicare PPO | Attending: Emergency Medicine | Admitting: Emergency Medicine

## 2022-10-29 DIAGNOSIS — I10 Essential (primary) hypertension: Secondary | ICD-10-CM | POA: Diagnosis not present

## 2022-10-29 DIAGNOSIS — R4182 Altered mental status, unspecified: Secondary | ICD-10-CM | POA: Diagnosis not present

## 2022-10-29 DIAGNOSIS — R001 Bradycardia, unspecified: Secondary | ICD-10-CM | POA: Diagnosis not present

## 2022-10-29 DIAGNOSIS — R404 Transient alteration of awareness: Secondary | ICD-10-CM | POA: Diagnosis not present

## 2022-10-29 DIAGNOSIS — R000 Tachycardia, unspecified: Secondary | ICD-10-CM | POA: Diagnosis not present

## 2022-10-29 DIAGNOSIS — Z1152 Encounter for screening for COVID-19: Secondary | ICD-10-CM | POA: Insufficient documentation

## 2022-10-29 DIAGNOSIS — Z8673 Personal history of transient ischemic attack (TIA), and cerebral infarction without residual deficits: Secondary | ICD-10-CM | POA: Insufficient documentation

## 2022-10-29 DIAGNOSIS — E119 Type 2 diabetes mellitus without complications: Secondary | ICD-10-CM | POA: Insufficient documentation

## 2022-10-29 DIAGNOSIS — I6381 Other cerebral infarction due to occlusion or stenosis of small artery: Secondary | ICD-10-CM | POA: Diagnosis not present

## 2022-10-29 LAB — COMPREHENSIVE METABOLIC PANEL
ALT: 11 U/L (ref 0–44)
AST: 16 U/L (ref 15–41)
Albumin: 3.6 g/dL (ref 3.5–5.0)
Alkaline Phosphatase: 39 U/L (ref 38–126)
Anion gap: 9 (ref 5–15)
BUN: 20 mg/dL (ref 8–23)
CO2: 26 mmol/L (ref 22–32)
Calcium: 8.9 mg/dL (ref 8.9–10.3)
Chloride: 103 mmol/L (ref 98–111)
Creatinine, Ser: 1.19 mg/dL (ref 0.61–1.24)
GFR, Estimated: 58 mL/min — ABNORMAL LOW (ref >60)
Glucose, Bld: 91 mg/dL (ref 70–99)
Potassium: 4.3 mmol/L (ref 3.5–5.1)
Sodium: 138 mmol/L (ref 135–145)
Total Bilirubin: 0.6 mg/dL (ref 0.3–1.2)
Total Protein: 6.5 g/dL (ref 6.5–8.1)

## 2022-10-29 LAB — URINALYSIS, ROUTINE W REFLEX MICROSCOPIC
Bilirubin Urine: NEGATIVE
Glucose, UA: NEGATIVE mg/dL
Hgb urine dipstick: NEGATIVE
Ketones, ur: NEGATIVE mg/dL
Leukocytes,Ua: NEGATIVE
Nitrite: NEGATIVE
Protein, ur: NEGATIVE mg/dL
Specific Gravity, Urine: 1.02 (ref 1.005–1.030)
pH: 5 (ref 5.0–8.0)

## 2022-10-29 LAB — CBC
HCT: 35.6 % — ABNORMAL LOW (ref 39.0–52.0)
Hemoglobin: 11.4 g/dL — ABNORMAL LOW (ref 13.0–17.0)
MCH: 32.8 pg (ref 26.0–34.0)
MCHC: 32 g/dL (ref 30.0–36.0)
MCV: 102.3 fL — ABNORMAL HIGH (ref 80.0–100.0)
Platelets: 235 10*3/uL (ref 150–400)
RBC: 3.48 MIL/uL — ABNORMAL LOW (ref 4.22–5.81)
RDW: 13.3 % (ref 11.5–15.5)
WBC: 7.6 10*3/uL (ref 4.0–10.5)
nRBC: 0 % (ref 0.0–0.2)

## 2022-10-29 LAB — RESP PANEL BY RT-PCR (RSV, FLU A&B, COVID)  RVPGX2
Influenza A by PCR: NEGATIVE
Influenza B by PCR: NEGATIVE
Resp Syncytial Virus by PCR: NEGATIVE
SARS Coronavirus 2 by RT PCR: NEGATIVE

## 2022-10-29 LAB — CBG MONITORING, ED: Glucose-Capillary: 89 mg/dL (ref 70–99)

## 2022-10-29 MED ORDER — SODIUM CHLORIDE 0.9 % IV BOLUS
500.0000 mL | Freq: Once | INTRAVENOUS | Status: AC
  Administered 2022-10-29: 500 mL via INTRAVENOUS

## 2022-10-29 NOTE — ED Provider Notes (Signed)
Emergency Department Provider Note   I have reviewed the triage vital signs and the nursing notes.   HISTORY  Chief Complaint Altered Mental Status   HPI [name] [name] is a 89 y.o. male with past history of diabetes, hyperlipidemia, prior stroke, mild cognitive challenge presents to the emergency department with his daughter for episode of altered mental status this evening.  He has had intermittent episodes like this in the past but tonight was different and that he did not come right back to his normal mental status right away.  Daughter, at bedside, describes sitting at the dinner table, closing his eyes, not responding verbally.  She did not notice any rhythmic shaking movements or twitching.  He has done this in the past but typically comes out of it fairly quickly and sometimes answers questions during these episodes.  With tonight's episode EMS was called at which point he was back around and basically back to his normal self.  Patient denies any pain.  Daughter notes he has been eating and drinking well.  He lives at home with his wife along with 24-hour nursing care and close family supervision/help.    Past Medical History:  Diagnosis Date   Acute CVA (cerebrovascular accident) ([place]) [id number]   Acute metabolic encephalopathy [id number]   Anxiety    CAP (community acquired pneumonia) 04/04/2017   Change in bowel habits 11/06/2016   Dementia ([place])    Diabetes mellitus    Diabetes mellitus ([place]) 04/04/2017   Essential tremor 08/20/2015   Hyperlipidemia    Late onset Alzheimer's disease without behavioral disturbance ([place]) 10/13/2018   MCI (mild cognitive impairment) with memory loss 08/20/2015   Memory loss 02/13/2015   Obesity    OBSTRUCTIVE SLEEP APNEA 07/19/2008   Qualifier: Diagnosis of  By: [name] LPN, [name]     Osteoarthritis of knee 10/01/2017   PNA (pneumonia)    Sleep apnea    uses CPAP at night to sleep   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 ENT: No sore throat. Cardiovascular: Denies chest pain. Respiratory: Denies shortness of breath. Gastrointestinal: No abdominal pain.  No nausea, no vomiting.  No diarrhea.  No constipation. Genitourinary: Negative for dysuria. Musculoskeletal: Negative for back pain. Skin: Negative for rash. Neurological: Negative for headaches, focal weakness or numbness.  ____________________________________________   PHYSICAL EXAM:  VITAL SIGNS: ED Triage Vitals  Enc Vitals Group     BP 10/29/22 2013 135/81     Pulse Rate 10/29/22 2013 70     Resp 10/29/22 2013 19     Temp 10/29/22 2012 98.1 F (36.7 C)     Temp Source 10/29/22 2012 Oral     SpO2 10/29/22 2013 99 %     Weight 10/29/22 2016 177 lb 0.5 oz (80.3 kg)     Height 10/29/22 2016 5' 9"$  (1.753 m)   Constitutional: Alert and oriented. Well appearing and in no acute distress. Eyes: Conjunctivae are normal. Head: Atraumatic. Nose: No congestion/rhinnorhea. Mouth/Throat: Mucous membranes are moist. Neck: No stridor.   Cardiovascular: Normal rate, regular rhythm. Good peripheral circulation. Grossly normal heart sounds.   Respiratory: Normal respiratory effort.  No retractions. Lungs CTAB. Gastrointestinal: Soft and nontender. No distention.  Musculoskeletal: No lower extremity tenderness nor  edema. No gross deformities of extremities. Neurologic:  Normal speech and language. No gross focal neurologic deficits are appreciated.  Skin:  Skin is warm, dry and intact. No rash noted.  ____________________________________________   LABS (all labs ordered are listed, but only abnormal results are displayed)  Labs Reviewed  COMPREHENSIVE METABOLIC PANEL - Abnormal; Notable for the following  components:      Result Value   GFR, Estimated 58 (*)    All other components within normal limits  CBC - Abnormal; Notable for the following components:   RBC 3.48 (*)    Hemoglobin 11.4 (*)    HCT 35.6 (*)    MCV 102.3 (*)    All other components within normal limits  RESP PANEL BY RT-PCR (RSV, FLU A&B, COVID)  [id number]  URINALYSIS, ROUTINE W REFLEX MICROSCOPIC  CBG MONITORING, ED   ____________________________________________  EKG  Rate: 77 PR: 200 QTc: 449  Sinus rhythm. RBBB. No STEMI.  ____________________________________________  RADIOLOGY  CT Head Wo Contrast  Result Date: 10/29/2022 CLINICAL DATA:  Mental status change EXAM: CT HEAD WITHOUT CONTRAST TECHNIQUE: Contiguous axial images were obtained from the base of the skull through the vertex without intravenous contrast. RADIATION DOSE REDUCTION: This exam was performed according to the departmental dose-optimization program which includes automated exposure control, adjustment of the mA and/or kV according to patient size and/or use of iterative reconstruction technique. COMPARISON:  CT head 08/15/2022.  MRI head 04/05/2015. FINDINGS: Brain: No evidence of acute infarction, hemorrhage, hydrocephalus, extra-axial collection or mass lesion/mass effect. Again seen is moderate diffuse atrophy. Old lacunar infarct in the left basal ganglia again noted. There is stable mild periventricular white matter hypodensity, likely chronic small vessel ischemic change. Vascular: Atherosclerotic calcifications are present within the cavernous internal carotid arteries. Skull: Normal. Negative for fracture or focal lesion. Sinuses/Orbits: No acute finding. Other: None. IMPRESSION: 1. No acute intracranial process. 2. Stable atrophy and chronic small vessel ischemic changes. Electronically Signed   By: Ronney Asters M.D.   On: 10/29/2022 21:05    ____________________________________________   PROCEDURES  Procedure(s) performed:    Procedures  None  ____________________________________________   INITIAL IMPRESSION / ASSESSMENT AND PLAN / ED COURSE  Pertinent labs & imaging results that were available during my care of the patient were reviewed by me and considered in my medical decision making (see chart for details).   This patient is Presenting for Evaluation of AMS, which does require a range of treatment options, and is a complaint that involves a high risk of morbidity and mortality.  The Differential Diagnoses includes but is not exclusive to alcohol, illicit or prescription medications, intracranial pathology such as stroke, intracerebral hemorrhage, fever or infectious causes including sepsis, hypoxemia, uremia, trauma, endocrine related disorders such as diabetes, hypoglycemia, thyroid-related diseases, etc.   Critical Interventions-    Medications  sodium chloride 0.9 % bolus 500 mL (0 mLs Intravenous Stopped 10/29/22 2248)    Reassessment after intervention: No change in mental status. Currently at baseline.    I did obtain Additional Historical Information from daughter at bedside.  I decided to review pertinent External Data, and in summary patient with brief obs admit in December 2023 for similar.   Clinical Laboratory Tests Ordered, included CBC without leukocytosis. CBG normal. No UTI.   Radiologic Tests Ordered, included CT head. I independently interpreted the images and agree with radiology interpretation.   Cardiac Monitor Tracing which shows NSR.    Social Determinants of Health Risk lives at home  with 24/7 nursing care.   Medical Decision Making: Summary:  Patient presents emergency department with altered mental status.  Currently back to his baseline.  No clear partial seizure or generalized tonic-clonic seizure activity per daughter report.  No stigmata of seizure on my exam.  I do plan for neuroimaging as well as labs and UA. Plan to reassess after IVF.   Reevaluation with  update and discussion with patient and daughter at bedside. He remains at his mental status baseline. Plan for d/c home and close PCP and Neuro follow up. Discussed ED return precautions.   Considered admission but workup is reassuring. Currently at baseline.   Patient's presentation is most consistent with acute presentation with potential threat to life or bodily function.   Disposition: discharge  ____________________________________________  FINAL CLINICAL IMPRESSION(S) / ED DIAGNOSES  Final diagnoses:  Transient alteration of awareness    Note:  This document was prepared using Dragon voice recognition software and may include unintentional dictation errors.  Nanda Quinton, MD, [facility]    Cari Burgo, [facility], MD 10/29/22 [phone]

## 2022-10-29 NOTE — ED Triage Notes (Addendum)
Patient BIB EMS. Family reports patient has slow responses and increased confusion today. They report he had an episode of drowsiness and decreased LOC for about an hour. Patient has dementia at baseline. A&Ox2 and answering questions on arrival. Patient denies pain, nausea, and vomiting. Family states he is acting similar to when he has a UTI.

## 2022-10-29 NOTE — Discharge Instructions (Signed)
You were seen in the emergency room today with a transient change in your level of consciousness.  Please follow close with your primary care physician as well as your neurologist.  Return with any new or suddenly worsening symptoms.

## 2022-10-29 NOTE — ED Notes (Signed)
Attempted to have patient

## 2022-12-06 ENCOUNTER — Other Ambulatory Visit: Payer: Self-pay | Admitting: Nurse Practitioner

## 2022-12-06 DIAGNOSIS — J329 Chronic sinusitis, unspecified: Secondary | ICD-10-CM

## 2022-12-23 DIAGNOSIS — F02B0 Dementia in other diseases classified elsewhere, moderate, without behavioral disturbance, psychotic disturbance, mood disturbance, and anxiety: Secondary | ICD-10-CM | POA: Diagnosis not present

## 2022-12-23 DIAGNOSIS — I6930 Unspecified sequelae of cerebral infarction: Secondary | ICD-10-CM | POA: Diagnosis not present

## 2022-12-23 DIAGNOSIS — G301 Alzheimer's disease with late onset: Secondary | ICD-10-CM | POA: Diagnosis not present

## 2022-12-23 DIAGNOSIS — E782 Mixed hyperlipidemia: Secondary | ICD-10-CM | POA: Diagnosis not present

## 2022-12-23 DIAGNOSIS — Z794 Long term (current) use of insulin: Secondary | ICD-10-CM | POA: Diagnosis not present

## 2022-12-23 DIAGNOSIS — E1122 Type 2 diabetes mellitus with diabetic chronic kidney disease: Secondary | ICD-10-CM | POA: Diagnosis not present

## 2022-12-23 DIAGNOSIS — K219 Gastro-esophageal reflux disease without esophagitis: Secondary | ICD-10-CM | POA: Diagnosis not present

## 2022-12-23 DIAGNOSIS — I129 Hypertensive chronic kidney disease with stage 1 through stage 4 chronic kidney disease, or unspecified chronic kidney disease: Secondary | ICD-10-CM | POA: Diagnosis not present

## 2022-12-23 DIAGNOSIS — N1831 Chronic kidney disease, stage 3a: Secondary | ICD-10-CM | POA: Diagnosis not present

## 2023-01-19 ENCOUNTER — Encounter (HOSPITAL_COMMUNITY): Payer: Self-pay

## 2023-01-19 ENCOUNTER — Other Ambulatory Visit: Payer: Self-pay

## 2023-01-19 ENCOUNTER — Emergency Department (HOSPITAL_COMMUNITY): Payer: Medicare PPO

## 2023-01-19 ENCOUNTER — Emergency Department (HOSPITAL_COMMUNITY)
Admission: EM | Admit: 2023-01-19 | Discharge: 2023-01-19 | Disposition: A | Discharge status: Home / Self Care | Payer: Medicare PPO | Attending: Emergency Medicine | Admitting: Emergency Medicine

## 2023-01-19 DIAGNOSIS — R55 Syncope and collapse: Secondary | ICD-10-CM

## 2023-01-19 DIAGNOSIS — R531 Weakness: Secondary | ICD-10-CM | POA: Diagnosis not present

## 2023-01-19 DIAGNOSIS — Z7982 Long term (current) use of aspirin: Secondary | ICD-10-CM | POA: Diagnosis not present

## 2023-01-19 DIAGNOSIS — R739 Hyperglycemia, unspecified: Secondary | ICD-10-CM | POA: Diagnosis not present

## 2023-01-19 DIAGNOSIS — R450 Nervousness: Secondary | ICD-10-CM | POA: Diagnosis not present

## 2023-01-19 DIAGNOSIS — Z7401 Bed confinement status: Secondary | ICD-10-CM | POA: Diagnosis not present

## 2023-01-19 LAB — URINALYSIS, ROUTINE W REFLEX MICROSCOPIC
Bilirubin Urine: NEGATIVE
Glucose, UA: NEGATIVE mg/dL
Hgb urine dipstick: NEGATIVE
Ketones, ur: NEGATIVE mg/dL
Leukocytes,Ua: NEGATIVE
Nitrite: NEGATIVE
Protein, ur: NEGATIVE mg/dL
Specific Gravity, Urine: 1.017 (ref 1.005–1.030)
pH: 5 (ref 5.0–8.0)

## 2023-01-19 LAB — CBC WITH DIFFERENTIAL/PLATELET
Abs Immature Granulocytes: 0.02 10*3/uL (ref 0.00–0.07)
Basophils Absolute: 0 10*3/uL (ref 0.0–0.1)
Basophils Relative: 0 %
Eosinophils Absolute: 0.1 10*3/uL (ref 0.0–0.5)
Eosinophils Relative: 1 %
HCT: 32.7 % — ABNORMAL LOW (ref 39.0–52.0)
Hemoglobin: 10.7 g/dL — ABNORMAL LOW (ref 13.0–17.0)
Immature Granulocytes: 0 %
Lymphocytes Relative: 30 %
Lymphs Abs: 2 10*3/uL (ref 0.7–4.0)
MCH: 32.4 pg (ref 26.0–34.0)
MCHC: 32.7 g/dL (ref 30.0–36.0)
MCV: 99.1 fL (ref 80.0–100.0)
Monocytes Absolute: 0.5 10*3/uL (ref 0.1–1.0)
Monocytes Relative: 8 %
Neutro Abs: 4.2 10*3/uL (ref 1.7–7.7)
Neutrophils Relative %: 61 %
Platelets: 182 10*3/uL (ref 150–400)
RBC: 3.3 MIL/uL — ABNORMAL LOW (ref 4.22–5.81)
RDW: 13.7 % (ref 11.5–15.5)
WBC: 6.8 10*3/uL (ref 4.0–10.5)
nRBC: 0 % (ref 0.0–0.2)

## 2023-01-19 LAB — TROPONIN I (HIGH SENSITIVITY)
Troponin I (High Sensitivity): 9 ng/L (ref <18)
Troponin I (High Sensitivity): 9 ng/L (ref <18)

## 2023-01-19 LAB — COMPREHENSIVE METABOLIC PANEL
ALT: 13 U/L (ref 0–44)
AST: 15 U/L (ref 15–41)
Albumin: 3.6 g/dL (ref 3.5–5.0)
Alkaline Phosphatase: 40 U/L (ref 38–126)
Anion gap: 9 (ref 5–15)
BUN: 19 mg/dL (ref 8–23)
CO2: 26 mmol/L (ref 22–32)
Calcium: 9.2 mg/dL (ref 8.9–10.3)
Chloride: 102 mmol/L (ref 98–111)
Creatinine, Ser: 1.21 mg/dL (ref 0.61–1.24)
GFR, Estimated: 56 mL/min — ABNORMAL LOW (ref >60)
Glucose, Bld: 145 mg/dL — ABNORMAL HIGH (ref 70–99)
Potassium: 3.8 mmol/L (ref 3.5–5.1)
Sodium: 137 mmol/L (ref 135–145)
Total Bilirubin: 0.6 mg/dL (ref 0.3–1.2)
Total Protein: 6.1 g/dL — ABNORMAL LOW (ref 6.5–8.1)

## 2023-01-19 NOTE — ED Triage Notes (Signed)
BIBA from home for a syncopal episode after transferring from bed to wheelchair. Pt is reported to have been shaking when this happened. Hx of dementia, A&O x 3 100/60 BP 74 HR 96% room air 192 cbg

## 2023-01-19 NOTE — Discharge Instructions (Signed)
Return for any problem.  ?

## 2023-01-19 NOTE — ED Notes (Signed)
[facility] notified for pt transport 

## 2023-01-19 NOTE — ED Provider Notes (Signed)
[name] EMERGENCY DEPARTMENT AT [facility] Provider Note   CSN: [id number] Arrival date & time: 01/19/23  1704     History  Chief Complaint  Patient presents with   Weakness    [name] [name] is a 89 y.o. male.  89 year old male with prior medical history as detailed below presents for evaluation.  Patient arrives from home.  Patient had brief syncopal event earlier today while doing transfer from bed to wheelchair.  Patient is comfortable on evaluation.  He cannot recall the details of the event.  He is otherwise without complaint.  He denies pain.  He denies shortness of breath.  Additional history obtained from patient's daughter when she arrives.  Patient with frequent episodes as described above.  During transfers the patient will become mildly agitated.  Daughter feels that this may explain the patient's symptoms earlier today.  Patient is otherwise at baseline.  The history is provided by the patient and medical records.       Home Medications Prior to Admission medications   Medication Sig Start Date End Date Taking? Authorizing Provider  ACCU-CHEK SMARTVIEW test strip USE ONCE A DAY AS DIRECTED 02/17/18   [provider]  albuterol (VENTOLIN HFA) 108 (90 Base) MCG/ACT inhaler Inhale 2 puffs into the lungs every 6 (six) hours as needed for wheezing or shortness of breath. 09/04/22   Cobb, Ruby Cola, NP  aspirin EC 325 MG EC tablet Take 1 tablet (325 mg total) by mouth daily. 04/06/17   Rai, Delene Ruffini, MD  B-D ULTRAFINE III SHORT PEN 31G X 8 MM MISC  12/28/14   [provider]  Cholecalciferol (VITAMIN D-3 PO) Take 1,000 Units by mouth daily.     [provider]  citalopram (CELEXA) 20 MG tablet TAKE 1 TABLET (20 MG TOTAL) BY MOUTH DAILY. 08/04/16   Dohmeier, Porfirio Mylar, MD  Continuous Blood Gluc Sensor (FREESTYLE LIBRE 2 SENSOR) MISC See admin instructions. 01/06/21   [provider]  doxazosin (CARDURA) 4 MG tablet  Take 4 mg by mouth daily.    [provider]  fluticasone (FLONASE) 50 MCG/ACT nasal spray SPRAY 2 SPRAYS INTO EACH NOSTRIL EVERY DAY 12/08/22   Cobb, Ruby Cola, NP  folic acid (FOLVITE) 800 MCG tablet Take 800 mcg by mouth daily.    [provider]  insulin glargine (LANTUS) 100 UNIT/ML injection Inject 14 Units into the skin daily after supper.    [provider]  loratadine-pseudoephedrine (CLARITIN-D 24-HOUR) 10-240 MG per 24 hr tablet Take 1 tablet by mouth daily as needed for allergies.    [provider]  metFORMIN (GLUCOPHAGE) 1000 MG tablet Take 1,000 mg by mouth 2 (two) times daily with a meal.    [provider]  methotrexate (RHEUMATREX) 2.5 MG tablet Take by mouth. 12/18/11   [provider]  Multiple Vitamin (MULTIVITAMIN WITH MINERALS) TABS Take 1 tablet by mouth daily.    [provider]  raNITIdine HCl (ZANTAC PO) Take 1 tablet by mouth daily.    [provider]  simvastatin (ZOCOR) 40 MG tablet Take 40 mg by mouth daily. 08/03/15   [provider]      Allergies    Donepezil, Lisinopril, Methocarbamol, Namenda [memantine hcl], and Other    Review of Systems   Review of Systems  All other systems reviewed and are negative.   Physical Exam Updated Vital Signs BP 109/68   Pulse 71   Temp 98 F (36.7 C) (Rectal)  Resp 17   SpO2 96%  Physical Exam Vitals and nursing note reviewed.  Constitutional:      General: He is not in acute distress.    Appearance: Normal appearance. He is well-developed.  HENT:     Head: Normocephalic and atraumatic.  Eyes:     Conjunctiva/sclera: Conjunctivae normal.     Pupils: Pupils are equal, round, and reactive to light.  Cardiovascular:     Rate and Rhythm: Normal rate and regular rhythm.     Heart sounds: Normal heart sounds.  Pulmonary:     Effort: Pulmonary effort is normal. No respiratory distress.     Breath sounds: Normal breath sounds.   Abdominal:     General: There is no distension.     Palpations: Abdomen is soft.     Tenderness: There is no abdominal tenderness.  Musculoskeletal:        General: No deformity. Normal range of motion.     Cervical back: Normal range of motion and neck supple.  Skin:    General: Skin is warm and dry.  Neurological:     General: No focal deficit present.     Mental Status: He is alert and oriented to person, place, and time. Mental status is at baseline.     ED Results / Procedures / Treatments   Labs (all labs ordered are listed, but only abnormal results are displayed) Labs Reviewed  COMPREHENSIVE METABOLIC PANEL - Abnormal; Notable for the following components:      Result Value   Glucose, Bld 145 (*)    Total Protein 6.1 (*)    GFR, Estimated 56 (*)    All other components within normal limits  CBC WITH DIFFERENTIAL/PLATELET - Abnormal; Notable for the following components:   RBC 3.30 (*)    Hemoglobin 10.7 (*)    HCT 32.7 (*)    All other components within normal limits  URINALYSIS, ROUTINE W REFLEX MICROSCOPIC  TROPONIN I (HIGH SENSITIVITY)  TROPONIN I (HIGH SENSITIVITY)    EKG EKG Interpretation  Date/Time:  Monday Jan 19 2023 18:11:27 EDT Ventricular Rate:  69 PR Interval:  209 QRS Duration: 154 QT Interval:  424 QTC Calculation: 455 R Axis:   102 Text Interpretation: Sinus rhythm RBBB and LPFB Confirmed by Kristine Royal [phone]) on 01/19/2023 6:21:34 PM  Radiology CT Head Wo Contrast  Result Date: 01/19/2023 CLINICAL DATA:  Syncope. EXAM: CT HEAD WITHOUT CONTRAST TECHNIQUE: Contiguous axial images were obtained from the base of the skull through the vertex without intravenous contrast. RADIATION DOSE REDUCTION: This exam was performed according to the departmental dose-optimization program which includes automated exposure control, adjustment of the mA and/or kV according to patient size and/or use of iterative reconstruction technique. COMPARISON:  Head CT  12/28/2022.  MRI head 04/04/2017 FINDINGS: Brain: No evidence of acute infarction, hemorrhage, hydrocephalus, extra-axial collection or mass lesion/mass effect. Again seen is moderate diffuse atrophy and mild periventricular white matter hypodensity, likely chronic small vessel ischemic change. There is a small old infarct in the left corona radiata which is unchanged. Vascular: Atherosclerotic calcifications are present within the cavernous internal carotid arteries. Skull: Normal. Negative for fracture or focal lesion. Sinuses/Orbits: No acute finding. Other: None. IMPRESSION: 1. No acute intracranial abnormality. 2. Moderate diffuse atrophy and mild chronic small vessel ischemic change. Electronically Signed   By: Darliss Cheney M.D.   On: 01/19/2023 18:48   DG Chest Port 1 View  Result Date: 01/19/2023 CLINICAL DATA:  Syncope. EXAM: PORTABLE CHEST 1  VIEW COMPARISON:  08/15/2022 FINDINGS: Normal heart size for technique. Unchanged mediastinal contours. Aortic atherosclerosis. Subsegmental opacities at the left lung base. No pulmonary edema, pleural effusion, or pneumothorax. Advanced bilateral shoulder arthropathy. IMPRESSION: Subsegmental left lung base atelectasis or scarring. Electronically Signed   By: Narda Rutherford M.D.   On: 01/19/2023 18:02    Procedures Procedures    Medications Ordered in ED Medications - No data to display  ED Course/ Medical Decision Making/ A&P                             Medical Decision Making Amount and/or Complexity of Data Reviewed Labs: ordered. Radiology: ordered.    Medical Screen Complete  This patient presented to the ED with complaint of near syncope.  This complaint involves an extensive number of treatment options. The initial differential diagnosis includes, but is not limited to, metabolic abnormality, infection, etc.  This presentation is: Acute, Chronic, Self-Limited, Previously Undiagnosed, Uncertain Prognosis, Complicated, Systemic  Symptoms, and Threat to Life/Bodily Function  Patient with apparent near syncopal episode that occurred today during transfer from bed to wheelchair.  Patient is at baseline on arrival.  Workup is without evidence of significant abnormality.    Patient's daughter is at bedside.  She feels the patient would be best served by returning home.  She declines admission /continued observation.  Daughter reports that the patient has a overnight nurse available at home to care for the patient.  Importance of close follow-up is stressed.  Strict return precautions given and understood. Additional history obtained:  External records from outside sources obtained and reviewed including prior ED visits and prior Inpatient records.    Lab Tests:  I ordered and personally interpreted labs.  The pertinent results include: CBC, CMP, troponin, UA   Imaging Studies ordered:  I ordered imaging studies including CT head, plain films chest I independently visualized and interpreted obtained imaging which showed NAD I agree with the radiologist interpretation.   Cardiac Monitoring:  The patient was maintained on a cardiac monitor.  I personally viewed and interpreted the cardiac monitor which showed an underlying rhythm of: NSR   Problem List / ED Course:  Near syncope, dementia   Reevaluation:  After the interventions noted above, I reevaluated the patient and found that they have: resolved    Disposition:  After consideration of the diagnostic results and the patients response to treatment, I feel that the patent would benefit from close outpatient follow-up.          Final Clinical Impression(s) / ED Diagnoses Final diagnoses:  Near syncope    Rx / DC Orders ED Discharge Orders     None         Wynetta Fines, MD 01/19/23 2230

## 2023-05-02 ENCOUNTER — Other Ambulatory Visit: Payer: Self-pay

## 2023-05-02 ENCOUNTER — Emergency Department (HOSPITAL_COMMUNITY)
Admission: EM | Admit: 2023-05-02 | Discharge: 2023-05-02 | Disposition: A | Discharge status: Home / Self Care | Payer: Medicare PPO | Attending: Emergency Medicine | Admitting: Emergency Medicine

## 2023-05-02 ENCOUNTER — Encounter (HOSPITAL_COMMUNITY): Payer: Self-pay

## 2023-05-02 ENCOUNTER — Emergency Department (HOSPITAL_COMMUNITY): Payer: Medicare PPO

## 2023-05-02 DIAGNOSIS — F0390 Unspecified dementia without behavioral disturbance: Secondary | ICD-10-CM | POA: Insufficient documentation

## 2023-05-02 DIAGNOSIS — E11649 Type 2 diabetes mellitus with hypoglycemia without coma: Secondary | ICD-10-CM | POA: Insufficient documentation

## 2023-05-02 DIAGNOSIS — J9600 Acute respiratory failure, unspecified whether with hypoxia or hypercapnia: Secondary | ICD-10-CM | POA: Diagnosis not present

## 2023-05-02 DIAGNOSIS — Z794 Long term (current) use of insulin: Secondary | ICD-10-CM | POA: Diagnosis not present

## 2023-05-02 DIAGNOSIS — G4733 Obstructive sleep apnea (adult) (pediatric): Secondary | ICD-10-CM | POA: Insufficient documentation

## 2023-05-02 DIAGNOSIS — I700 Atherosclerosis of aorta: Secondary | ICD-10-CM | POA: Diagnosis not present

## 2023-05-02 DIAGNOSIS — D649 Anemia, unspecified: Secondary | ICD-10-CM | POA: Diagnosis not present

## 2023-05-02 DIAGNOSIS — R531 Weakness: Secondary | ICD-10-CM | POA: Diagnosis not present

## 2023-05-02 DIAGNOSIS — R230 Cyanosis: Secondary | ICD-10-CM | POA: Diagnosis not present

## 2023-05-02 DIAGNOSIS — Z7401 Bed confinement status: Secondary | ICD-10-CM | POA: Diagnosis not present

## 2023-05-02 DIAGNOSIS — Z7982 Long term (current) use of aspirin: Secondary | ICD-10-CM | POA: Insufficient documentation

## 2023-05-02 DIAGNOSIS — Z7984 Long term (current) use of oral hypoglycemic drugs: Secondary | ICD-10-CM | POA: Diagnosis not present

## 2023-05-02 DIAGNOSIS — R0602 Shortness of breath: Secondary | ICD-10-CM

## 2023-05-02 DIAGNOSIS — E162 Hypoglycemia, unspecified: Secondary | ICD-10-CM

## 2023-05-02 DIAGNOSIS — R4182 Altered mental status, unspecified: Secondary | ICD-10-CM | POA: Diagnosis present

## 2023-05-02 LAB — CBC WITH DIFFERENTIAL/PLATELET
Abs Immature Granulocytes: 0.05 10*3/uL (ref 0.00–0.07)
Basophils Absolute: 0 10*3/uL (ref 0.0–0.1)
Basophils Relative: 0 %
Eosinophils Absolute: 0 10*3/uL (ref 0.0–0.5)
Eosinophils Relative: 0 %
HCT: 35.8 % — ABNORMAL LOW (ref 39.0–52.0)
Hemoglobin: 11.9 g/dL — ABNORMAL LOW (ref 13.0–17.0)
Immature Granulocytes: 0 %
Lymphocytes Relative: 9 %
Lymphs Abs: 1.1 10*3/uL (ref 0.7–4.0)
MCH: 32.3 pg (ref 26.0–34.0)
MCHC: 33.2 g/dL (ref 30.0–36.0)
MCV: 97.3 fL (ref 80.0–100.0)
Monocytes Absolute: 0.8 10*3/uL (ref 0.1–1.0)
Monocytes Relative: 6 %
Neutro Abs: 11.3 10*3/uL — ABNORMAL HIGH (ref 1.7–7.7)
Neutrophils Relative %: 85 %
Platelets: 200 10*3/uL (ref 150–400)
RBC: 3.68 MIL/uL — ABNORMAL LOW (ref 4.22–5.81)
RDW: 13.4 % (ref 11.5–15.5)
WBC: 13.3 10*3/uL — ABNORMAL HIGH (ref 4.0–10.5)
nRBC: 0 % (ref 0.0–0.2)

## 2023-05-02 LAB — BASIC METABOLIC PANEL
Anion gap: 10 (ref 5–15)
BUN: 25 mg/dL — ABNORMAL HIGH (ref 8–23)
CO2: 25 mmol/L (ref 22–32)
Calcium: 9.3 mg/dL (ref 8.9–10.3)
Chloride: 102 mmol/L (ref 98–111)
Creatinine, Ser: 1.19 mg/dL (ref 0.61–1.24)
GFR, Estimated: 57 mL/min — ABNORMAL LOW (ref >60)
Glucose, Bld: 67 mg/dL — ABNORMAL LOW (ref 70–99)
Potassium: 3.6 mmol/L (ref 3.5–5.1)
Sodium: 137 mmol/L (ref 135–145)

## 2023-05-02 LAB — CBG MONITORING, ED: Glucose-Capillary: 139 mg/dL — ABNORMAL HIGH (ref 70–99)

## 2023-05-02 NOTE — Discharge Instructions (Signed)
Evaluation in the emergency department did not show any serious condition.  His blood sugar was a little low, please monitor his sugars at home and give snacks as needed.  If sugar continues to run low, talk with his doctor about reducing or eliminating his diabetes medications.

## 2023-05-02 NOTE — ED Notes (Signed)
[facility] arranged at this time to transport pt home for d/c as pt has no use of BLE and does not ambulate at baseline.

## 2023-05-02 NOTE — ED Triage Notes (Signed)
Pt arrives via [facility] after his wife woke up to hear him breathing abnormally. EMS reports pt is supposed to sleep with a cpap but was found cyanotic, with a room air spo2 of 72%. Pt now on room air, awake and alert to person which is his baseline mentation. Pt in NAD.

## 2023-05-02 NOTE — ED Provider Notes (Signed)
[place] EMERGENCY DEPARTMENT AT [facility] Provider Note   CSN: [id number] Arrival date & time: 05/02/23  0158     History  No chief complaint on file.   [name] [name] is a 89 y.o. male.  The history is provided by the EMS personnel and a relative. The history is limited by the condition of the patient (Dementia).  He has history of diabetes, hyperlipidemia, stroke, dementia and was brought in because he was hyperventilating at home.  Apparently, he is supposed to wear a CPAP mask but will not wear it and tonight he got tangled up in the hoses and got very anxious.  EMS report oxygen saturation 72% on room air.  Family member states that they do not have oxygen to administer at home.  In the emergency department, he is back to his baseline mental status.   Home Medications Prior to Admission medications   Medication Sig Start Date End Date Taking? Authorizing Provider  ACCU-CHEK SMARTVIEW test strip USE ONCE A DAY AS DIRECTED 02/17/18   [provider]  albuterol (VENTOLIN HFA) 108 (90 Base) MCG/ACT inhaler Inhale 2 puffs into the lungs every 6 (six) hours as needed for wheezing or shortness of breath. 09/04/22   Cobb, Ruby Cola, NP  aspirin EC 325 MG EC tablet Take 1 tablet (325 mg total) by mouth daily. 04/06/17   Rai, Delene Ruffini, MD  B-D ULTRAFINE III SHORT PEN 31G X 8 MM MISC  12/28/14   [provider]  Cholecalciferol (VITAMIN D-3 PO) Take 1,000 Units by mouth daily.     [provider]  citalopram (CELEXA) 20 MG tablet TAKE 1 TABLET (20 MG TOTAL) BY MOUTH DAILY. 08/04/16   Dohmeier, Porfirio Mylar, MD  Continuous Blood Gluc Sensor (FREESTYLE LIBRE 2 SENSOR) MISC See admin instructions. 01/06/21   [provider]  doxazosin (CARDURA) 4 MG tablet Take 4 mg by mouth daily.    [provider]  fluticasone (FLONASE) 50 MCG/ACT nasal spray SPRAY 2 SPRAYS INTO EACH NOSTRIL EVERY DAY 12/08/22   Cobb, Ruby Cola, NP  folic acid  (FOLVITE) 800 MCG tablet Take 800 mcg by mouth daily.    [provider]  insulin glargine (LANTUS) 100 UNIT/ML injection Inject 14 Units into the skin daily after supper.    [provider]  loratadine-pseudoephedrine (CLARITIN-D 24-HOUR) 10-240 MG per 24 hr tablet Take 1 tablet by mouth daily as needed for allergies.    [provider]  metFORMIN (GLUCOPHAGE) 1000 MG tablet Take 1,000 mg by mouth 2 (two) times daily with a meal.    [provider]  methotrexate (RHEUMATREX) 2.5 MG tablet Take by mouth. 12/18/11   [provider]  Multiple Vitamin (MULTIVITAMIN WITH MINERALS) TABS Take 1 tablet by mouth daily.    [provider]  raNITIdine HCl (ZANTAC PO) Take 1 tablet by mouth daily.    [provider]  simvastatin (ZOCOR) 40 MG tablet Take 40 mg by mouth daily. 08/03/15   [provider]      Allergies    Donepezil, Lisinopril, Methocarbamol, Namenda [memantine hcl], and Other    Review of Systems   Review of Systems  Unable to perform ROS: Dementia    Physical Exam Updated Vital Signs BP (!) 142/89 (BP Location: Right Arm)   Pulse 95   Temp 99.5 F (37.5 C)   Resp 20   SpO2 99%  Physical Exam Vitals and nursing note reviewed.   89 year old male,  resting comfortably and in no acute distress. Vital signs are significant for elevated blood pressure. Oxygen saturation is 99%, which is normal. Head is normocephalic and atraumatic. PERRLA, EOMI. Neck is nontender and supple without adenopathy or JVD. Lungs are clear without rales, wheezes, or rhonchi. Chest is nontender. Heart has regular rate and rhythm without murmur. Abdomen is soft, flat, nontender. Extremities have no cyanosis or edema. Skin is warm and dry without rash. Neurologic: Oriented to person but not place or time, mostly nonverbal, cranial nerves are intact, there are no motor or sensory deficits.  ED Results / Procedures / Treatments    Labs (all labs ordered are listed, but only abnormal results are displayed) Labs Reviewed  BASIC METABOLIC PANEL - Abnormal; Notable for the following components:      Result Value   Glucose, Bld 67 (*)    BUN 25 (*)    GFR, Estimated 57 (*)    All other components within normal limits  CBC WITH DIFFERENTIAL/PLATELET - Abnormal; Notable for the following components:   WBC 13.3 (*)    RBC 3.68 (*)    Hemoglobin 11.9 (*)    HCT 35.8 (*)    Neutro Abs 11.3 (*)    All other components within normal limits  CBG MONITORING, ED - Abnormal; Notable for the following components:   Glucose-Capillary 139 (*)    All other components within normal limits    Radiology DG Chest Port 1 View  Result Date: 05/02/2023 CLINICAL DATA:  Shortness of breath. EXAM: PORTABLE CHEST 1 VIEW COMPARISON:  01/19/2023. FINDINGS: The heart size and mediastinal contours are within normal limits. There is atherosclerotic calcification of the aorta. No consolidation, effusion, or pneumothorax. No acute osseous abnormality. IMPRESSION: No active disease. Electronically Signed   By: Thornell Sartorius M.D.   On: 05/02/2023 03:32    Procedures Procedures    Medications Ordered in ED Medications - No data to display  ED Course/ Medical Decision Making/ A&P                                 Medical Decision Making Amount and/or Complexity of Data Reviewed Labs: ordered. Radiology: ordered.   Episode of hypoxia at home likely secondary to failing to wear CPAP.  Per family member, he always pulls it off and has not worn it consistently for a long time.  He is back to his baseline.  I have ordered screening labs of CBC and basic metabolic panel and I have ordered a chest x-ray.  Chest x-ray shows no active cardiopulmonary disease.  Have independently viewed the images, and agree with the radiologist's interpretation.  I have reviewed and interpreted his laboratory test, and my interpretation is mild hypoglycemia, stable  anemia, mild leukocytosis which is nonspecific.  Glucose level was only 67, I did not feel he needed parenteral glucose.  He was given a snack and glucose repeated and was at an adequate level of 139.  I feel he is safe for discharge.  I have informed family to talk with his primary care provider about his medication for diabetes - if he continues to have episodes of hypoglycemia, it may be prudent to reduce or eliminate his diabetic medication.  Final Clinical Impression(s) / ED Diagnoses Final diagnoses:  SOB (shortness of breath)  Obstructive sleep apnea  Hypoglycemia  Normochromic normocytic anemia    Rx / DC Orders ED Discharge Orders  None         Dione Booze, MD 05/02/23 [phone]

## 2023-05-08 ENCOUNTER — Telehealth: Payer: Self-pay | Admitting: Pulmonary Disease

## 2023-05-08 DIAGNOSIS — G301 Alzheimer's disease with late onset: Secondary | ICD-10-CM | POA: Diagnosis not present

## 2023-05-08 DIAGNOSIS — L409 Psoriasis, unspecified: Secondary | ICD-10-CM | POA: Diagnosis not present

## 2023-05-08 DIAGNOSIS — I700 Atherosclerosis of aorta: Secondary | ICD-10-CM | POA: Diagnosis not present

## 2023-05-08 DIAGNOSIS — I6930 Unspecified sequelae of cerebral infarction: Secondary | ICD-10-CM | POA: Diagnosis not present

## 2023-05-08 DIAGNOSIS — G4730 Sleep apnea, unspecified: Secondary | ICD-10-CM | POA: Diagnosis not present

## 2023-05-08 DIAGNOSIS — F02B0 Dementia in other diseases classified elsewhere, moderate, without behavioral disturbance, psychotic disturbance, mood disturbance, and anxiety: Secondary | ICD-10-CM | POA: Diagnosis not present

## 2023-05-08 NOTE — Telephone Encounter (Signed)
Pt would have to seen on a video visit due to him being bed written

## 2023-05-15 NOTE — Telephone Encounter (Signed)
Please schedule virtual hospital follow up.

## 2023-05-15 NOTE — Telephone Encounter (Signed)
Done pt. Has my chart with Np in Oct.

## 2023-05-26 ENCOUNTER — Telehealth (INDEPENDENT_AMBULATORY_CARE_PROVIDER_SITE_OTHER): Payer: Medicare PPO | Admitting: Primary Care

## 2023-05-26 DIAGNOSIS — J40 Bronchitis, not specified as acute or chronic: Secondary | ICD-10-CM | POA: Diagnosis not present

## 2023-05-26 DIAGNOSIS — G4733 Obstructive sleep apnea (adult) (pediatric): Secondary | ICD-10-CM | POA: Diagnosis not present

## 2023-05-26 MED ORDER — DOXYCYCLINE HYCLATE 100 MG PO TABS: 100.0000 mg | ORAL_TABLET | Freq: Two times a day (BID) | ORAL | 0 refills | Status: DC

## 2023-05-26 NOTE — Patient Instructions (Signed)
Replace CPAP Encourage patient wear every night

## 2023-05-26 NOTE — Progress Notes (Signed)
Virtual Visit via Video Note  I connected with [name] [name] on 05/26/23 at  2:00 PM EDT by a video enabled telemedicine application and verified that I am speaking with the correct person using two identifiers.  Location: Patient: Home Provider: Office    I discussed the limitations of evaluation and management by telemedicine and the availability of in person appointments. The patient expressed understanding and agreed to proceed.  History of Present Illness: 89 year old male, never smoker followed for OSA on CPAP. He is a patient of Dr. Trena Platt and last seen in office 07/18/2021. Past medical history significant for HTN, hx of stroke, chronic sinusitis, GERD, DM, Alzheimer's dementia, CKD.  Previous [facility] pulmonary encounter: 07/18/2022: OV with Dr. Wynona Neat. History of OSA. Dysfunctional machine. Declined repeat sleep study. Will try to restart CPAP therapy without one. Struggles with dyspnea and occasional O2 drops. Walking oximetry without desaturation; maintain >96%. Unable to ambulate much. Thought to be multifactorial and related to significant deconditioning. CXR nl.  09/04/2022:  Patient presents today with his wife for overdue follow-up, who provides his history.  He was last seen a little over a year ago.  Recently admitted for altered mental status, felt to be related to polypharmacy. Recovered quickly and was discharged home a day later, on 12/2. He has struggled with dyspnea for many years now.  Previous imaging has been unrevealing. No previous PFTs. Has not wanted to undergo significant evaluation/workup.  Walking oximetry at last visit without desaturation.  His wife tells me today that he still is having trouble with shortness of breath, that seems to get worse in the evenings when he has his bouts of confusion.  Stable over the past year or longer. He is also not wearing his CPAP consistently.  Did wear it in the past 3 nights, with good control.  He has restless sleep on  the nights he does not wear it. Also snores. Has an occasional cough, which is chronic and related to his sinuses.  He does have a lot of postnasal drainage.  Not currently on any nasal sprays.  She is worried he needs oxygen. O2 levels at home are never below 90%. Does take Claritin daily.  Denies any wheezing, fevers, chills, hemoptysis, lower extremity swelling, orthopnea, PND.  He was on inhalers years ago but his wife is not sure what these were. No formal pulmonary history, aside from his OSA.   05/26/2023 Patient contacted today for ED follow-up.   Followed by Dr. Wynona Neat for history of obstructive sleep apnea, in the past he has been inconsistent with CPAP use. Underlying Alzheimer's/dementia. Longstanding history of dyspnea.  Patient is a never smoker.  Dyspnea felt to be related to deconditioning.  He did not qualify for oxygen during walk test back in December 2023.  Encourage physical therapy and strength conditioning.  He was seen in the emergency room on 05/02/2023 for shortness of breath.  There was an isolated event where he was unable to wear CPAP mask and got tangled up with the tubing and became very anxious.  EMS reported oxygen saturation 72% on room air.  Chest x-ray showed no active cardiopulmonary disease.  He was noted to have mild hypoglycemia, glucose level was 67 but after eating improved to 139.   Spoke with patients aid. Patient was present on video call. Needs new CPAP, current one is not working. Download shows compliance. Currently has a cough, nurse can hear rattle to one side of his chest. Taking diabetic tussin.  Has difficulty getting mucus up. Mucus is thick and starting to become colored. Breathing is no labored. He does not wear oxygen.   Airview download 04/25/23 - 05/24/2023 Usage days 30/30 days (100%) Average usage 7 hours 54 minutes Pressure 6 to 18 cm H2O Airleaks 12.8Lmin (95%) AHI 0.8   Observations/Objective:  Patient has dementia/Alzheimer, visualized  patient. He appears well. Awake and alert. No acute respiratory symptoms. Spoke with nurse.   Assessment and Plan:  OSA: - Download shows 100% compliance with CPAP use, needs replacement machine. DME order placed. Current pressure 6-18cm h20 with residual AHI 0.8/hour. No changes. Advised patient wear CPAP nightly 4-6 hours or longer.    Bronchitis: - Acute; Cough is congested, mucus starting to become purulent.  - Continue Tussin-dm cough syrup over the counter to loosen congestion  - RX doxycyline 100mg  twice daily x 7 days   Follow Up Instructions:   3 months  I discussed the assessment and treatment plan with the patient. The patient was provided an opportunity to ask questions and all were answered. The patient agreed with the plan and demonstrated an understanding of the instructions.   The patient was advised to call back or seek an in-person evaluation if the symptoms worsen or if the condition fails to improve as anticipated.  I provided 22 minutes of non-face-to-face time during this encounter.   [name], NP

## 2023-06-08 DIAGNOSIS — R0609 Other forms of dyspnea: Secondary | ICD-10-CM

## 2023-06-11 NOTE — Telephone Encounter (Signed)
When I did the video visit the care taker who was present told me he was not using machine and that it was not working. This was confusing cause the download showed compliance but I placed the order because that was what I was told. Download shows he is wearing, is someone else using his cpap right now? Sorry for the confusion, we can discontinue CPAP order.

## 2023-06-15 NOTE — Telephone Encounter (Signed)
I spoke with Eunice Blase and as well as Beth. Due to pts condition we will not be able to walk him in the office. Beth advised a ONO. Order has been placed Daughter and wife has been notified. NFN at this time

## 2023-06-23 ENCOUNTER — Telehealth: Payer: Medicare PPO | Admitting: Adult Health

## 2023-09-11 DIAGNOSIS — F02B0 Dementia in other diseases classified elsewhere, moderate, without behavioral disturbance, psychotic disturbance, mood disturbance, and anxiety: Secondary | ICD-10-CM | POA: Diagnosis not present

## 2023-09-11 DIAGNOSIS — K219 Gastro-esophageal reflux disease without esophagitis: Secondary | ICD-10-CM | POA: Diagnosis not present

## 2023-09-11 DIAGNOSIS — L409 Psoriasis, unspecified: Secondary | ICD-10-CM | POA: Diagnosis not present

## 2023-09-11 DIAGNOSIS — I6930 Unspecified sequelae of cerebral infarction: Secondary | ICD-10-CM | POA: Diagnosis not present

## 2023-09-11 DIAGNOSIS — E1165 Type 2 diabetes mellitus with hyperglycemia: Secondary | ICD-10-CM | POA: Diagnosis not present

## 2023-09-11 DIAGNOSIS — E1122 Type 2 diabetes mellitus with diabetic chronic kidney disease: Secondary | ICD-10-CM | POA: Diagnosis not present

## 2023-09-11 DIAGNOSIS — N1831 Chronic kidney disease, stage 3a: Secondary | ICD-10-CM | POA: Diagnosis not present

## 2023-09-11 DIAGNOSIS — E782 Mixed hyperlipidemia: Secondary | ICD-10-CM | POA: Diagnosis not present

## 2023-09-11 DIAGNOSIS — G301 Alzheimer's disease with late onset: Secondary | ICD-10-CM | POA: Diagnosis not present

## 2023-11-12 DIAGNOSIS — E782 Mixed hyperlipidemia: Secondary | ICD-10-CM | POA: Diagnosis not present

## 2023-11-12 DIAGNOSIS — E1165 Type 2 diabetes mellitus with hyperglycemia: Secondary | ICD-10-CM | POA: Diagnosis not present

## 2023-11-12 DIAGNOSIS — G301 Alzheimer's disease with late onset: Secondary | ICD-10-CM | POA: Diagnosis not present

## 2023-11-12 DIAGNOSIS — I6930 Unspecified sequelae of cerebral infarction: Secondary | ICD-10-CM | POA: Diagnosis not present

## 2023-11-12 DIAGNOSIS — K219 Gastro-esophageal reflux disease without esophagitis: Secondary | ICD-10-CM | POA: Diagnosis not present

## 2023-11-12 DIAGNOSIS — L409 Psoriasis, unspecified: Secondary | ICD-10-CM | POA: Diagnosis not present

## 2023-11-12 DIAGNOSIS — N1831 Chronic kidney disease, stage 3a: Secondary | ICD-10-CM | POA: Diagnosis not present

## 2023-11-12 DIAGNOSIS — E1122 Type 2 diabetes mellitus with diabetic chronic kidney disease: Secondary | ICD-10-CM | POA: Diagnosis not present

## 2023-11-12 DIAGNOSIS — I129 Hypertensive chronic kidney disease with stage 1 through stage 4 chronic kidney disease, or unspecified chronic kidney disease: Secondary | ICD-10-CM | POA: Diagnosis not present

## 2023-11-17 DIAGNOSIS — Z111 Encounter for screening for respiratory tuberculosis: Secondary | ICD-10-CM | POA: Diagnosis not present

## 2023-11-25 ENCOUNTER — Inpatient Hospital Stay (HOSPITAL_COMMUNITY)
Admission: EM | Admit: 2023-11-25 | Discharge: 2023-12-01 | DRG: 193 | Disposition: A | Discharge status: Home Health | Attending: Student in an Organized Health Care Education/Training Program | Admitting: Student in an Organized Health Care Education/Training Program

## 2023-11-25 ENCOUNTER — Encounter (HOSPITAL_COMMUNITY): Payer: Self-pay | Admitting: Internal Medicine

## 2023-11-25 ENCOUNTER — Emergency Department (HOSPITAL_COMMUNITY)

## 2023-11-25 ENCOUNTER — Other Ambulatory Visit: Payer: Self-pay

## 2023-11-25 DIAGNOSIS — E119 Type 2 diabetes mellitus without complications: Secondary | ICD-10-CM | POA: Diagnosis not present

## 2023-11-25 DIAGNOSIS — F0280 Dementia in other diseases classified elsewhere without behavioral disturbance: Secondary | ICD-10-CM | POA: Diagnosis not present

## 2023-11-25 DIAGNOSIS — Z79899 Other long term (current) drug therapy: Secondary | ICD-10-CM

## 2023-11-25 DIAGNOSIS — Z96652 Presence of left artificial knee joint: Secondary | ICD-10-CM | POA: Diagnosis present

## 2023-11-25 DIAGNOSIS — Z8673 Personal history of transient ischemic attack (TIA), and cerebral infarction without residual deficits: Secondary | ICD-10-CM | POA: Diagnosis not present

## 2023-11-25 DIAGNOSIS — G301 Alzheimer's disease with late onset: Secondary | ICD-10-CM | POA: Diagnosis present

## 2023-11-25 DIAGNOSIS — I213 ST elevation (STEMI) myocardial infarction of unspecified site: Secondary | ICD-10-CM | POA: Diagnosis not present

## 2023-11-25 DIAGNOSIS — A419 Sepsis, unspecified organism: Secondary | ICD-10-CM

## 2023-11-25 DIAGNOSIS — R531 Weakness: Secondary | ICD-10-CM | POA: Diagnosis not present

## 2023-11-25 DIAGNOSIS — G4733 Obstructive sleep apnea (adult) (pediatric): Secondary | ICD-10-CM | POA: Diagnosis present

## 2023-11-25 DIAGNOSIS — R0902 Hypoxemia: Secondary | ICD-10-CM | POA: Diagnosis not present

## 2023-11-25 DIAGNOSIS — J189 Pneumonia, unspecified organism: Principal | ICD-10-CM | POA: Diagnosis present

## 2023-11-25 DIAGNOSIS — N3000 Acute cystitis without hematuria: Secondary | ICD-10-CM | POA: Diagnosis not present

## 2023-11-25 DIAGNOSIS — Z801 Family history of malignant neoplasm of trachea, bronchus and lung: Secondary | ICD-10-CM

## 2023-11-25 DIAGNOSIS — E785 Hyperlipidemia, unspecified: Secondary | ICD-10-CM | POA: Diagnosis present

## 2023-11-25 DIAGNOSIS — I959 Hypotension, unspecified: Secondary | ICD-10-CM | POA: Diagnosis not present

## 2023-11-25 DIAGNOSIS — D539 Nutritional anemia, unspecified: Secondary | ICD-10-CM | POA: Diagnosis present

## 2023-11-25 DIAGNOSIS — J9601 Acute respiratory failure with hypoxia: Secondary | ICD-10-CM | POA: Diagnosis not present

## 2023-11-25 DIAGNOSIS — Z1152 Encounter for screening for COVID-19: Secondary | ICD-10-CM

## 2023-11-25 DIAGNOSIS — Z7984 Long term (current) use of oral hypoglycemic drugs: Secondary | ICD-10-CM | POA: Diagnosis not present

## 2023-11-25 DIAGNOSIS — Z888 Allergy status to other drugs, medicaments and biological substances status: Secondary | ICD-10-CM

## 2023-11-25 DIAGNOSIS — B952 Enterococcus as the cause of diseases classified elsewhere: Secondary | ICD-10-CM | POA: Diagnosis not present

## 2023-11-25 DIAGNOSIS — I4510 Unspecified right bundle-branch block: Secondary | ICD-10-CM | POA: Diagnosis present

## 2023-11-25 DIAGNOSIS — Z66 Do not resuscitate: Secondary | ICD-10-CM | POA: Diagnosis present

## 2023-11-25 DIAGNOSIS — Z794 Long term (current) use of insulin: Secondary | ICD-10-CM | POA: Diagnosis not present

## 2023-11-25 DIAGNOSIS — J181 Lobar pneumonia, unspecified organism: Secondary | ICD-10-CM | POA: Diagnosis not present

## 2023-11-25 DIAGNOSIS — Z8379 Family history of other diseases of the digestive system: Secondary | ICD-10-CM

## 2023-11-25 DIAGNOSIS — Z7982 Long term (current) use of aspirin: Secondary | ICD-10-CM | POA: Diagnosis not present

## 2023-11-25 DIAGNOSIS — R4182 Altered mental status, unspecified: Secondary | ICD-10-CM | POA: Diagnosis present

## 2023-11-25 DIAGNOSIS — Z7401 Bed confinement status: Secondary | ICD-10-CM | POA: Diagnosis not present

## 2023-11-25 DIAGNOSIS — I10 Essential (primary) hypertension: Secondary | ICD-10-CM | POA: Diagnosis not present

## 2023-11-25 DIAGNOSIS — R0689 Other abnormalities of breathing: Secondary | ICD-10-CM | POA: Diagnosis not present

## 2023-11-25 DIAGNOSIS — R509 Fever, unspecified: Secondary | ICD-10-CM | POA: Diagnosis not present

## 2023-11-25 LAB — HEMOGLOBIN A1C
Hgb A1c MFr Bld: 6.8 % — ABNORMAL HIGH (ref 4.8–5.6)
Mean Plasma Glucose: 148.46 mg/dL

## 2023-11-25 LAB — COMPREHENSIVE METABOLIC PANEL
ALT: 8 U/L (ref 0–44)
AST: 12 U/L — ABNORMAL LOW (ref 15–41)
Albumin: 2.6 g/dL — ABNORMAL LOW (ref 3.5–5.0)
Alkaline Phosphatase: 38 U/L (ref 38–126)
Anion gap: 9 (ref 5–15)
BUN: 29 mg/dL — ABNORMAL HIGH (ref 8–23)
CO2: 24 mmol/L (ref 22–32)
Calcium: 8.7 mg/dL — ABNORMAL LOW (ref 8.9–10.3)
Chloride: 102 mmol/L (ref 98–111)
Creatinine, Ser: 1.01 mg/dL (ref 0.61–1.24)
GFR, Estimated: >60 mL/min (ref >60)
Glucose, Bld: 125 mg/dL — ABNORMAL HIGH (ref 70–99)
Potassium: 3.6 mmol/L (ref 3.5–5.1)
Sodium: 135 mmol/L (ref 135–145)
Total Bilirubin: 0.9 mg/dL (ref 0.0–1.2)
Total Protein: 5.3 g/dL — ABNORMAL LOW (ref 6.5–8.1)

## 2023-11-25 LAB — CBC WITH DIFFERENTIAL/PLATELET
Abs Immature Granulocytes: 0.03 10*3/uL (ref 0.00–0.07)
Basophils Absolute: 0 10*3/uL (ref 0.0–0.1)
Basophils Relative: 0 %
Eosinophils Absolute: 0 10*3/uL (ref 0.0–0.5)
Eosinophils Relative: 0 %
HCT: 30.7 % — ABNORMAL LOW (ref 39.0–52.0)
Hemoglobin: 10 g/dL — ABNORMAL LOW (ref 13.0–17.0)
Immature Granulocytes: 0 %
Lymphocytes Relative: 12 %
Lymphs Abs: 1 10*3/uL (ref 0.7–4.0)
MCH: 32.1 pg (ref 26.0–34.0)
MCHC: 32.6 g/dL (ref 30.0–36.0)
MCV: 98.4 fL (ref 80.0–100.0)
Monocytes Absolute: 0.5 10*3/uL (ref 0.1–1.0)
Monocytes Relative: 6 %
Neutro Abs: 6.6 10*3/uL (ref 1.7–7.7)
Neutrophils Relative %: 82 %
Platelets: 237 10*3/uL (ref 150–400)
RBC: 3.12 MIL/uL — ABNORMAL LOW (ref 4.22–5.81)
RDW: 13.2 % (ref 11.5–15.5)
WBC: 8.1 10*3/uL (ref 4.0–10.5)
nRBC: 0 % (ref 0.0–0.2)

## 2023-11-25 LAB — URINALYSIS, W/ REFLEX TO CULTURE (INFECTION SUSPECTED)
Bilirubin Urine: NEGATIVE
Glucose, UA: 50 mg/dL — AB
Hgb urine dipstick: NEGATIVE
Ketones, ur: NEGATIVE mg/dL
Nitrite: NEGATIVE
Protein, ur: NEGATIVE mg/dL
Specific Gravity, Urine: 1.018 (ref 1.005–1.030)
WBC, UA: >50 WBC/hpf (ref 0–5)
pH: 5 (ref 5.0–8.0)

## 2023-11-25 LAB — I-STAT CG4 LACTIC ACID, ED
Lactic Acid, Venous: 1.3 mmol/L (ref 0.5–1.9)
Lactic Acid, Venous: 0.8 mmol/L (ref 0.5–1.9)

## 2023-11-25 LAB — GLUCOSE, CAPILLARY
Glucose-Capillary: 65 mg/dL — ABNORMAL LOW (ref 70–99)
Glucose-Capillary: 130 mg/dL — ABNORMAL HIGH (ref 70–99)
Glucose-Capillary: 44 mg/dL — CL (ref 70–99)
Glucose-Capillary: 86 mg/dL (ref 70–99)

## 2023-11-25 LAB — RESP PANEL BY RT-PCR (RSV, FLU A&B, COVID)  RVPGX2
Influenza A by PCR: NEGATIVE
Influenza B by PCR: NEGATIVE
Resp Syncytial Virus by PCR: NEGATIVE
SARS Coronavirus 2 by RT PCR: NEGATIVE

## 2023-11-25 LAB — BASIC METABOLIC PANEL
Anion gap: 9 (ref 5–15)
BUN: 31 mg/dL — ABNORMAL HIGH (ref 8–23)
CO2: 26 mmol/L (ref 22–32)
Calcium: 9.2 mg/dL (ref 8.9–10.3)
Chloride: 99 mmol/L (ref 98–111)
Creatinine, Ser: 1.09 mg/dL (ref 0.61–1.24)
GFR, Estimated: >60 mL/min (ref >60)
Glucose, Bld: 150 mg/dL — ABNORMAL HIGH (ref 70–99)
Potassium: 3.7 mmol/L (ref 3.5–5.1)
Sodium: 134 mmol/L — ABNORMAL LOW (ref 135–145)

## 2023-11-25 LAB — PROTIME-INR
INR: 1.1 (ref 0.8–1.2)
Prothrombin Time: 14.5 s (ref 11.4–15.2)

## 2023-11-25 LAB — CBG MONITORING, ED
Glucose-Capillary: 118 mg/dL — ABNORMAL HIGH (ref 70–99)
Glucose-Capillary: 157 mg/dL — ABNORMAL HIGH (ref 70–99)

## 2023-11-25 LAB — APTT: aPTT: 33 s (ref 24–36)

## 2023-11-25 MED ORDER — ACETAMINOPHEN 325 MG PO TABS: 650.0000 mg | ORAL_TABLET | Freq: Four times a day (QID) | ORAL | Status: DC | PRN

## 2023-11-25 MED ORDER — AZITHROMYCIN 500 MG IV SOLR
500.0000 mg | INTRAVENOUS | Status: DC
  Administered 2023-11-26 – 2023-11-27 (×2): 500 mg via INTRAVENOUS
  Filled 2023-11-25 (×2): qty 5

## 2023-11-25 MED ORDER — ASPIRIN 325 MG PO TBEC
325.0000 mg | DELAYED_RELEASE_TABLET | Freq: Every day | ORAL | Status: DC
  Administered 2023-11-25 – 2023-12-01 (×7): 325 mg via ORAL
  Filled 2023-11-25 (×7): qty 1

## 2023-11-25 MED ORDER — ONDANSETRON HCL 4 MG/2ML IJ SOLN
4.0000 mg | Freq: Four times a day (QID) | INTRAMUSCULAR | Status: DC | PRN
Start: 2023-11-25 — End: 2023-12-01

## 2023-11-25 MED ORDER — CITALOPRAM HYDROBROMIDE 20 MG PO TABS
20.0000 mg | ORAL_TABLET | Freq: Every day | ORAL | Status: DC
  Administered 2023-11-25 – 2023-12-01 (×7): 20 mg via ORAL
  Filled 2023-11-25 (×4): qty 1
  Filled 2023-11-25: qty 2
  Filled 2023-11-25 (×2): qty 1

## 2023-11-25 MED ORDER — SIMVASTATIN 40 MG PO TABS
40.0000 mg | ORAL_TABLET | Freq: Every day | ORAL | Status: DC
  Administered 2023-11-25 – 2023-12-01 (×7): 40 mg via ORAL
  Filled 2023-11-25 (×4): qty 1
  Filled 2023-11-25: qty 2
  Filled 2023-11-25 (×2): qty 1

## 2023-11-25 MED ORDER — ENOXAPARIN SODIUM 40 MG/0.4ML IJ SOSY
40.0000 mg | PREFILLED_SYRINGE | INTRAMUSCULAR | Status: DC
  Administered 2023-11-25 – 2023-12-01 (×7): 40 mg via SUBCUTANEOUS
  Filled 2023-11-25 (×7): qty 0.4

## 2023-11-25 MED ORDER — SODIUM CHLORIDE 0.9 % IV SOLN
500.0000 mg | Freq: Once | INTRAVENOUS | Status: AC
  Administered 2023-11-25: 500 mg via INTRAVENOUS
  Filled 2023-11-25: qty 5

## 2023-11-25 MED ORDER — LACTATED RINGERS IV SOLN: INTRAVENOUS | Status: AC

## 2023-11-25 MED ORDER — DEXTROSE 50 % IV SOLN
INTRAVENOUS | Status: AC
  Administered 2023-11-25: 25 mL
  Filled 2023-11-25: qty 50

## 2023-11-25 MED ORDER — ONDANSETRON HCL 4 MG PO TABS: 4.0000 mg | ORAL_TABLET | Freq: Four times a day (QID) | ORAL | Status: DC | PRN

## 2023-11-25 MED ORDER — INSULIN ASPART 100 UNIT/ML IJ SOLN: 0.0000 [IU] | Freq: Three times a day (TID) | INTRAMUSCULAR | Status: DC

## 2023-11-25 MED ORDER — SODIUM CHLORIDE 0.9 % IV SOLN
1.0000 g | INTRAVENOUS | Status: AC
  Administered 2023-11-26 – 2023-11-29 (×4): 1 g via INTRAVENOUS
  Filled 2023-11-25 (×4): qty 10

## 2023-11-25 MED ORDER — INSULIN ASPART 100 UNIT/ML IJ SOLN
0.0000 [IU] | Freq: Every day | INTRAMUSCULAR | Status: DC
  Filled 2023-11-25: qty 0.05

## 2023-11-25 MED ORDER — ACETAMINOPHEN 650 MG RE SUPP: 650.0000 mg | Freq: Four times a day (QID) | RECTAL | Status: DC | PRN

## 2023-11-25 MED ORDER — SODIUM CHLORIDE 0.9 % IV BOLUS
1000.0000 mL | Freq: Once | INTRAVENOUS | Status: AC
  Administered 2023-11-25: 1000 mL via INTRAVENOUS

## 2023-11-25 MED ORDER — INSULIN GLARGINE 100 UNIT/ML ~~LOC~~ SOLN
4.0000 [IU] | Freq: Every day | SUBCUTANEOUS | Status: DC
  Administered 2023-11-25: 4 [IU] via SUBCUTANEOUS
  Filled 2023-11-25: qty 0.04

## 2023-11-25 MED ORDER — INSULIN ASPART 100 UNIT/ML IJ SOLN
0.0000 [IU] | Freq: Three times a day (TID) | INTRAMUSCULAR | Status: DC
Start: 2023-11-25 — End: 2023-11-25
  Administered 2023-11-25: 3 [IU] via SUBCUTANEOUS
  Filled 2023-11-25: qty 0.15

## 2023-11-25 MED ORDER — SODIUM CHLORIDE 0.9 % IV SOLN
2.0000 g | Freq: Once | INTRAVENOUS | Status: AC
  Administered 2023-11-25: 2 g via INTRAVENOUS
  Filled 2023-11-25: qty 20

## 2023-11-25 MED ORDER — ALBUTEROL SULFATE (2.5 MG/3ML) 0.083% IN NEBU: 2.5000 mg | INHALATION_SOLUTION | RESPIRATORY_TRACT | Status: DC | PRN

## 2023-11-25 NOTE — H&P (Signed)
 History and Physical  [name] [name] ZOX:[id number] DOB: [birth year] DOA: 11/25/2023  PCP: [name], MD   Chief Complaint: Fever  HPI: [name] [name] is a 89 y.o. male with medical history significant for insulin-dependent diabetes, dementia being admitted to the hospital with bilateral community-acquired pneumonia and associated acute hypoxic respiratory failure.  History is provided by his daughter who is at the bedside.  The patient is bedbound at baseline, and he and his wife recently moved into [place].  Over the last 24 hours, daughter noticed that he seemed to be a little bit more lethargic than usual, though it is not out of the ordinary for him to sleep a lot on some days.  Last night, patient's wife noticed that he seemed very warm, she checked his temperature and it was 102.1.  Daughter at the bedside states that he has had some congestion on and off for the last week or so.  Evaluation in the emergency department shows evidence of bilateral pneumonia as well as possible UTI.  He was started on empiric antibiotics, placed on 2 L nasal cannula oxygen and admitted to the hospitalist service.  Review of Systems: Please see HPI for pertinent positives and negatives. A complete 10 system review of systems are otherwise negative.  Past Medical History:  Diagnosis Date   Acute CVA (cerebrovascular accident) (HCC) 04/05/2017   Acute metabolic encephalopathy 04/04/2017   Anxiety    CAP (community acquired pneumonia) 04/04/2017   Change in bowel habits 11/06/2016   Dementia (HCC)    Diabetes mellitus    Diabetes mellitus (HCC) 04/04/2017   Essential tremor 08/20/2015   Hyperlipidemia    Late onset Alzheimer's disease without behavioral disturbance (HCC) 10/13/2018   MCI (mild cognitive impairment) with memory loss 08/20/2015   Memory loss 02/13/2015   Obesity    OBSTRUCTIVE SLEEP APNEA 07/19/2008   Qualifier: Diagnosis of  By: Thad Ranger LPN, [name]     Osteoarthritis of knee  10/01/2017   PNA (pneumonia)    Sleep apnea    uses CPAP at night to sleep   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 He has never used smokeless tobacco. He reports that he does not drink alcohol and does not use drugs.  Allergies  Allergen Reactions   Donepezil Other (See Comments), Shortness Of Breath and Swelling    Other reaction(s): shortness of breath  Other Reaction(s): Not available   Lisinopril Anaphylaxis, Shortness Of Breath and Swelling    HAD TO COME TO THE E.D. AFTER TAKING THIS!!  Other reaction(s): angioedema  Other Reaction(s): Not available   Methocarbamol Anaphylaxis, Shortness Of Breath and Swelling    HAD TO COME TO THE E.D. AFTER TAKING THIS!!  Other reaction(s): OTHER REACTION, swelling  Other Reaction(s): Not available   Namenda [Memantine Hcl] Shortness Of Breath and Swelling   Other     Other reaction(s): shortness of breath    Family History  Problem Relation Age of Onset   Liver disease Mother    Cancer - Lung  Father        smoker     Prior to Admission medications   Medication Sig Start Date End Date Taking? Authorizing Provider  ACCU-CHEK SMARTVIEW test strip USE ONCE A DAY AS DIRECTED 02/17/18   [provider]  albuterol (VENTOLIN HFA) 108 (90 Base) MCG/ACT inhaler Inhale 2 puffs into the lungs every 6 (six) hours as needed for wheezing or shortness of breath. 09/04/22   Cobb, Ruby Cola, NP  aspirin EC 325 MG EC tablet Take 1 tablet (325 mg total) by mouth daily. 04/06/17   Rai, Delene Ruffini, MD  B-D ULTRAFINE III SHORT PEN 31G X 8  MM MISC  12/28/14   [provider]  Cholecalciferol (VITAMIN D-3 PO) Take 1,000 Units by mouth daily.     [provider]  citalopram (CELEXA) 20 MG tablet TAKE 1 TABLET (20 MG TOTAL) BY MOUTH DAILY. 08/04/16   Dohmeier, Porfirio Mylar, MD  Continuous Blood Gluc Sensor (FREESTYLE LIBRE 2 SENSOR) MISC See admin instructions. 01/06/21   [provider]  doxazosin (CARDURA) 4 MG tablet Take 4 mg by mouth daily.    [provider]  doxycycline (VIBRA-TABS) 100 MG tablet Take 1 tablet (100 mg total) by mouth 2 (two) times daily. 05/26/23   [name], NP  fluticasone [facility]) 50 MCG/ACT nasal spray SPRAY 2 SPRAYS INTO EACH NOSTRIL EVERY DAY 12/08/22   Cobb, Ruby Cola, NP  folic acid (FOLVITE) 800 MCG tablet Take 800 mcg by mouth daily.    [provider]  insulin glargine (LANTUS) 100 UNIT/ML injection Inject 14 Units into the skin daily after supper.    [provider]  loratadine-pseudoephedrine (CLARITIN-D 24-HOUR) 10-240 MG per 24 hr tablet Take 1 tablet by mouth daily as needed for allergies.    [provider]  metFORMIN (GLUCOPHAGE) 1000 MG tablet Take 1,000 mg by mouth 2 (two) times daily with a meal.    [provider]  methotrexate (RHEUMATREX) 2.5 MG tablet Take by mouth. 12/18/11   [provider]  Multiple Vitamin (MULTIVITAMIN WITH MINERALS) TABS Take 1 tablet by mouth daily.    [provider]  raNITIdine HCl (ZANTAC PO) Take 1 tablet by mouth daily.    [provider]  simvastatin (ZOCOR) 40 MG tablet Take 40 mg by mouth daily. 08/03/15   [provider]    Physical Exam: BP 107/71 (BP Location: Right Arm)   Pulse 82   Temp (!) 100.9 F (38.3 C) (Rectal)   Resp 20   Ht 5\' 9"  (1.753 m)   Wt 74.8 kg   SpO2 100%   BMI 24.37 kg/m  General: Sleeping but arousable, alert, oriented to self and place.  Daughter is at the bedside.  Patient is calm.  Unable to provide much history,  as he does not have his teeth in.  However he denies any concerns or complaints. Cardiovascular: RRR, no murmurs or rubs, no peripheral edema  Respiratory: Basilar crackles especially on the right.  No tachypnea, wheezing, or rhonchi.  Good equal bilateral air movement. Abdomen: soft, nontender, nondistended, normal bowel tones heard  Skin: dry, no rashes  Musculoskeletal: no joint effusions, normal range of motion  Psychiatric: appropriate affect, normal speech  Neurologic: extraocular muscles intact, clear speech, moving all extremities with intact sensorium         Labs on Admission:  Basic Metabolic Panel: Recent Labs  Lab 11/25/23 0440  NA 134*  K 3.7  CL 99  CO2 26  GLUCOSE 150*  BUN 31*  CREATININE 1.09  CALCIUM 9.2   Liver Function Tests: No results for input(s): "AST", "ALT", "ALKPHOS", "BILITOT", "PROT", "ALBUMIN" in the last 168 hours. No results for input(s): "LIPASE", "AMYLASE" in the last 168 hours. No results for input(s): "AMMONIA" in the last 168 hours. CBC: Recent Labs  Lab 11/25/23 0440  WBC 8.1  NEUTROABS 6.6  HGB 10.0*  HCT 30.7*  MCV 98.4  PLT 237   Cardiac Enzymes: No results for input(s): "CKTOTAL", "CKMB", "CKMBINDEX", "TROPONINI" in the last 168 hours. BNP (last 3 results) No results for input(s): "BNP" in the last 8760 hours.  ProBNP (last 3 results) No results for input(s): "PROBNP" in the last 8760 hours.  CBG: No results for input(s): "GLUCAP" in the last 168 hours.  Radiological Exams on Admission: DG Chest Port 1 View Result Date: 11/25/2023 CLINICAL DATA:  Questionable sepsis EXAM: PORTABLE CHEST 1 VIEW COMPARISON:  05/02/2019 FINDINGS: Infiltrate in the lower lungs since prior. No Kerley lines, effusion, or pneumothorax. Normal heart size and mediastinal contours. IMPRESSION: Pneumonia at both lung bases. Electronically Signed   By: Tiburcio Pea M.D.   On: 11/25/2023 07:05   Assessment/Plan [name] [name] is a 89 y.o.  male with medical history significant for insulin-dependent diabetes, dementia being admitted to the hospital with bilateral community-acquired pneumonia and associated acute hypoxic respiratory failure.  Acute hypoxic respiratory failure-due to community-acquired pneumonia, no negative viral respiratory swab.  Patient is not meeting SIRS criteria and is not septic.  Currently stable on 1 L nasal cannula oxygen. -Inpatient admission -Continue empiric IV azithromycin and IV Rocephin -Follow-up blood cultures  Abnormal UA-without specific urinary complaints -Follow-up urine culture  Insulin-dependent type 2 diabetes-continue home Lantus 4 units daily, with sliding scale.  Carb modified diet.  Chronic anemia-appears to be at baseline  Dementia-Celexa 20 mg p.o. daily  DVT prophylaxis: Lovenox     Code Status: Limited: Do not attempt resuscitation (DNR) -DNR-LIMITED -Do Not Intubate/DNI   Consults called: None  Admission status: The appropriate patient status for this patient is INPATIENT. Inpatient status is judged to be reasonable and necessary in order to provide the required intensity of service to ensure the patient's safety. The patient's presenting symptoms, physical exam findings, and initial radiographic and laboratory data in the context of their chronic comorbidities is felt to place them at high risk for further clinical deterioration. Furthermore, it is not anticipated that the patient will be medically stable for discharge from the hospital within 2 midnights of admission.    I certify that at the point of admission it is my clinical judgment that the patient will require inpatient hospital care spanning beyond 2 midnights from the point of admission due to high intensity of service, high risk for further deterioration and high frequency of surveillance required  Time spent: 55 minutes  Weyman Bogdon Sharlette Dense MD [facility] Pager [phone]  If 7PM-7AM, please contact  night-coverage [email or web address] Password [facility]  11/25/2023, 8:31 AM

## 2023-11-25 NOTE — ED Triage Notes (Signed)
 Pr arrived via EMS [place], increase weakness and confusion, hx dementia. At the moment can only respond yes or no. Urinary freq,  88hr 95% 4L 18 et 26rr 106/60 102F 20 L AC 250-500 NS 123 cbg

## 2023-11-25 NOTE — ED Notes (Signed)
 Assumed care of patient. Patient currently sleeping in bed with no signs of acute distress noted. Family at bedside. Waiting on admission orders.

## 2023-11-25 NOTE — ED Provider Notes (Signed)
 [place] EMERGENCY DEPARTMENT AT [facility] Provider Note   CSN: [id number] Arrival date & time: 11/25/23  0417     History  Chief Complaint  Patient presents with   Urinary Frequency   Altered Mental Status    [name] [name] is a 89 y.o. male.  HPI     This is a 89 year old male who presents from [place] with concerns for fever, confusion.  Patient was just placed in [place] last Friday.  History of dementia but is normally oriented to himself.  Noted to have a temperature of 102.1 per his family.  He has had some coughing.  Was placed on oxygen by EMS but does not normally on home oxygen.  Reportedly urinary frequency at his living facility.  Level 5 caveat for acuity of condition.  Patient has difficulty providing history because he does not have his dentures in.  Home Medications Prior to Admission medications   Medication Sig Start Date End Date Taking? Authorizing Provider  ACCU-CHEK SMARTVIEW test strip USE ONCE A DAY AS DIRECTED 02/17/18   [provider]  albuterol (VENTOLIN HFA) 108 (90 Base) MCG/ACT inhaler Inhale 2 puffs into the lungs every 6 (six) hours as needed for wheezing or shortness of breath. 09/04/22   Cobb, Ruby Cola, NP  aspirin EC 325 MG EC tablet Take 1 tablet (325 mg total) by mouth daily. 04/06/17   Rai, Delene Ruffini, MD  B-D ULTRAFINE III SHORT PEN 31G X 8 MM MISC  12/28/14   [provider]  Cholecalciferol (VITAMIN D-3 PO) Take 1,000 Units by mouth daily.     [provider]  citalopram (CELEXA) 20 MG tablet TAKE 1 TABLET (20 MG TOTAL) BY MOUTH DAILY. 08/04/16   Dohmeier, Porfirio Mylar, MD  Continuous Blood Gluc Sensor (FREESTYLE LIBRE 2 SENSOR) MISC See admin instructions. 01/06/21   [provider]  doxazosin (CARDURA) 4 MG tablet Take 4 mg by mouth daily.    [provider]  doxycycline (VIBRA-TABS) 100 MG tablet Take 1 tablet (100 mg total) by mouth 2 (two) times daily. 05/26/23   [name], NP  fluticasone [facility]) 50 MCG/ACT nasal spray SPRAY 2 SPRAYS INTO EACH NOSTRIL EVERY DAY 12/08/22   Cobb, Ruby Cola, NP  folic acid (FOLVITE) 800 MCG tablet Take 800 mcg by mouth daily.    [provider]  insulin glargine (LANTUS) 100 UNIT/ML injection Inject 14 Units into the skin daily after supper.    [provider]  loratadine-pseudoephedrine (CLARITIN-D 24-HOUR) 10-240 MG per 24 hr tablet Take 1 tablet by mouth daily as needed for allergies.    [provider]  metFORMIN (GLUCOPHAGE) 1000 MG tablet Take 1,000 mg by mouth 2 (two) times daily with a meal.    [provider]  methotrexate (RHEUMATREX) 2.5 MG tablet Take by mouth. 12/18/11   [provider]  Multiple Vitamin (MULTIVITAMIN WITH MINERALS) TABS Take 1 tablet by mouth daily.    [provider]  raNITIdine HCl (ZANTAC PO) Take 1 tablet by mouth daily.    [provider]  simvastatin (ZOCOR) 40 MG tablet Take 40 mg by mouth daily. 08/03/15   [provider]      Allergies    Donepezil, Lisinopril, Methocarbamol, Namenda [memantine hcl], and Other    Review of Systems   Review of Systems  Constitutional:  Positive for fever.  Respiratory:  Positive for cough and shortness of breath.   All other systems reviewed and are negative.  Physical Exam Updated Vital Signs BP 107/71 (BP Location: Right Arm)   Pulse 82   Temp (!) 100.9 F (38.3 C) (Rectal)   Resp 20   Ht 1.753 m (5\' 9" )   Wt 74.8 kg   SpO2 100%   BMI 24.37 kg/m  Physical Exam Vitals and nursing note reviewed.  Constitutional:      Appearance: He is well-developed.     Comments: Elderly, chronically ill-appearing, no acute distress  HENT:     Head: Normocephalic and atraumatic.     Mouth/Throat:     Mouth: Mucous membranes are dry.  Eyes:     Pupils: Pupils are equal, round, and reactive to light.  Cardiovascular:     Rate and Rhythm: Normal rate and regular rhythm.      Heart sounds: Normal heart sounds. No murmur heard. Pulmonary:     Effort: Pulmonary effort is normal. No respiratory distress.     Breath sounds: Rhonchi present. No wheezing.     Comments: Right middle lobe rhonchorous breath sounds, nasal cannula in place Abdominal:     General: Bowel sounds are normal.     Palpations: Abdomen is soft.     Tenderness: There is no abdominal tenderness. There is no rebound.  Musculoskeletal:     Cervical back: Neck supple.  Lymphadenopathy:     Cervical: No cervical adenopathy.  Skin:    General: Skin is warm and dry.  Neurological:     Mental Status: He is alert and oriented to person, place, and time.     Comments: Nonambulatory at baseline  Psychiatric:        Mood and Affect: Mood normal.     ED Results / Procedures / Treatments   Labs (all labs ordered are listed, but only abnormal results are displayed) Labs Reviewed  CBC WITH DIFFERENTIAL/PLATELET - Abnormal; Notable for the following components:      Result Value   RBC 3.12 (*)    Hemoglobin 10.0 (*)    HCT 30.7 (*)    All other components within normal limits  BASIC METABOLIC PANEL - Abnormal; Notable for the following components:   Sodium 134 (*)    Glucose, Bld 150 (*)    BUN 31 (*)    All other components within normal limits  URINALYSIS, W/ REFLEX TO CULTURE (INFECTION SUSPECTED) - Abnormal; Notable for the following components:   APPearance CLOUDY (*)    Glucose, UA 50 (*)    Leukocytes,Ua LARGE (*)    Bacteria, UA MANY (*)    All other components within normal limits  RESP PANEL BY RT-PCR (RSV, FLU A&B, COVID)  RVPGX2  CULTURE, BLOOD (ROUTINE X 2)  CULTURE, BLOOD (ROUTINE X 2)  URINE CULTURE  PROTIME-INR  APTT  COMPREHENSIVE METABOLIC PANEL  I-STAT CG4 LACTIC ACID, ED  I-STAT CG4 LACTIC ACID, ED  I-STAT CG4 LACTIC ACID, ED    EKG EKG Interpretation Date/Time:  Wednesday November 25 2023 05:26:59 EDT Ventricular Rate:  80 PR Interval:  190 QRS  Duration:  139 QT Interval:  393 QTC Calculation: 454 R Axis:   120  Text Interpretation: Sinus rhythm Atrial premature complex Right bundle branch block Confirmed by Ross Marcus ([id number]) on 11/25/2023 5:51:47 AM  Radiology No results found.  Procedures .Critical Care  Performed by: Shon Baton, MD Authorized by: Shon Baton, MD   Critical care provider statement:    Critical care time (minutes):  31   Critical care was necessary to treat  or prevent imminent or life-threatening deterioration of the following conditions:  Sepsis     Medications Ordered in ED Medications  lactated ringers infusion ( Intravenous New Bag/Given 11/25/23 0537)  cefTRIAXone (ROCEPHIN) 2 g in sodium chloride 0.9 % 100 mL IVPB (2 g Intravenous New Bag/Given 11/25/23 0534)  azithromycin (ZITHROMAX) 500 mg in sodium chloride 0.9 % 250 mL IVPB (500 mg Intravenous New Bag/Given 11/25/23 0541)    ED Course/ Medical Decision Making/ A&P Clinical Course as of 11/25/23 0653  Wed Nov 25, 2023  0641 Discussion with patient's daughter.  She is his healthcare power of attorney.  She has documentation with her but it does not indicate his advanced directive.  She states that he would not want to be kept alive artificially.  We discussed what CPR and intubation would entail.  After this discussion she decided to make him DNR.  Feel this is reasonable. [CH]    Clinical Course User Index [CH] Wave Calzada, Mayer Masker, MD                                 Medical Decision Making Amount and/or Complexity of Data Reviewed Labs: ordered. Radiology: ordered.  Risk Prescription drug management. Decision regarding hospitalization.   This patient presents to the ED for concern of fever, this involves an extensive number of treatment options, and is a complaint that carries with it a high risk of complications and morbidity.  I considered the following differential and admission for this acute, potentially life  threatening condition.  The differential diagnosis includes sepsis, viral etiology, pneumonia, urinary tract infection  MDM:    This is a 89 year old male who presents with reported fever.  Recently moved to living facility.  Daughter reports raspy breath sounds.  No known cough.  He is on oxygen right now but not on any home oxygen.  He has rales in the right lung.  Sepsis workup was initiated.  Lactate is normal.  Will hold off on aggressive fluid resuscitation as he does not appear to be in septic shock.  Temperature is 100.9.  He is not in any respiratory distress.  Patient was given Rocephin and azithromycin.  No significant leukocytosis.  He does appear to have a UTI.  I have reviewed his x-ray imaging myself.  Appears to have a right-sided pneumonia.  Will plan for admission.  See discussion regarding CODE STATUS above.  (Labs, imaging, consults)  Labs: I Ordered, and personally interpreted labs.  The pertinent results include: CBC, CMP, lactate, urinalysis, blood cultures  Imaging Studies ordered: I ordered imaging studies including x-ray I independently visualized and interpreted imaging. I agree with the radiologist interpretation  Additional history obtained from daughter at bedside.  External records from outside source obtained and reviewed including prior evaluations  Cardiac Monitoring: The patient was maintained on a cardiac monitor.  If on the cardiac monitor, I personally viewed and interpreted the cardiac monitored which showed an underlying rhythm of: Sinus  Reevaluation: After the interventions noted above, I reevaluated the patient and found that they have :stayed the same  Social Determinants of Health:  lives in living facility  Disposition: Admit  Co morbidities that complicate the patient evaluation  Past Medical History:  Diagnosis Date   Acute CVA (cerebrovascular accident) (HCC) 04/05/2017   Acute metabolic encephalopathy 04/04/2017   Anxiety    CAP  (community acquired pneumonia) 04/04/2017   Change in bowel habits 11/06/2016  Dementia (HCC)    Diabetes mellitus    Diabetes mellitus (HCC) 04/04/2017   Essential tremor 08/20/2015   Hyperlipidemia    Late onset Alzheimer's disease without behavioral disturbance (HCC) 10/13/2018   MCI (mild cognitive impairment) with memory loss 08/20/2015   Memory loss 02/13/2015   Obesity    OBSTRUCTIVE SLEEP APNEA 07/19/2008   Qualifier: Diagnosis of  By: Thad Ranger LPN, [name]     Osteoarthritis of knee 10/01/2017   PNA (pneumonia)    Sleep apnea    uses CPAP at night to sleep   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   Stroke (cerebrum) (HCC)    Stroke (HCC)    Tremor, essential 10/13/2018   Umbilical hernia without obstruction and without gangrene 11/06/2016   Vision loss, central, left 10/13/2018     Medicines Meds ordered this encounter  Medications   lactated ringers infusion   cefTRIAXone (ROCEPHIN) 2 g in sodium chloride 0.9 % 100 mL IVPB    Antibiotic Indication::   CAP   azithromycin (ZITHROMAX) 500 mg in sodium chloride 0.9 % 250 mL IVPB    Antibiotic Indication::   CAP    I have reviewed the patients home medicines and have made adjustments as needed  Problem List / ED Course: Problem List Items Addressed This Visit   None Visit Diagnoses       Pneumonia of right lower lobe due to infectious organism    -  Primary   Relevant Medications   cefTRIAXone (ROCEPHIN) 2 g in sodium chloride 0.9 % 100 mL IVPB (Completed)   azithromycin (ZITHROMAX) 500 mg in sodium chloride 0.9 % 250 mL IVPB (Completed)     Acute cystitis without hematuria         Sepsis, due to unspecified organism, unspecified whether acute organ dysfunction present [facility])                       Final Clinical Impression(s) / ED Diagnoses Final diagnoses:  Pneumonia of right lower lobe due to infectious organism  Acute cystitis without hematuria    Rx / DC Orders ED Discharge Orders     None         Shon Baton, MD 11/25/23 [phone]

## 2023-11-25 NOTE — Progress Notes (Signed)
 Hypoglycemic Event  CBG: 65   Treatment: 4 oz juice/soda  Symptoms: None  Follow-up CBG: Time:1730 CBG Result:44  Possible Reasons for Event: Inadequate meal intake  Comments/MD notified:Dr. Kirby Crigler notified    [name] [name]

## 2023-11-25 NOTE — ED Notes (Signed)
 Patient moved up in bed to eat breakfast. Patient reports that he feeds himself. Tray table in place and food open for patient. Patient reports no issues.

## 2023-11-25 NOTE — Sepsis Progress Note (Signed)
 Elink monitoring for the code sepsis protocol.

## 2023-11-25 NOTE — Progress Notes (Signed)
 Hypoglycemic Event  CBG: 44   Treatment: D50 25 mL (12.5 gm)  Symptoms: None  Follow-up CBG: Time:1800 CBG Result:130  Possible Reasons for Event: Inadequate meal intake  Comments/MD notified: Dr. Arlis Porta, Joslyn Hy

## 2023-11-25 NOTE — Plan of Care (Signed)

## 2023-11-26 DIAGNOSIS — J189 Pneumonia, unspecified organism: Secondary | ICD-10-CM | POA: Diagnosis not present

## 2023-11-26 LAB — CBC
HCT: 31.3 % — ABNORMAL LOW (ref 39.0–52.0)
Hemoglobin: 9.9 g/dL — ABNORMAL LOW (ref 13.0–17.0)
MCH: 32.8 pg (ref 26.0–34.0)
MCHC: 31.6 g/dL (ref 30.0–36.0)
MCV: 103.6 fL — ABNORMAL HIGH (ref 80.0–100.0)
Platelets: 216 10*3/uL (ref 150–400)
RBC: 3.02 MIL/uL — ABNORMAL LOW (ref 4.22–5.81)
RDW: 13.1 % (ref 11.5–15.5)
WBC: 8.6 10*3/uL (ref 4.0–10.5)
nRBC: 0 % (ref 0.0–0.2)

## 2023-11-26 LAB — BASIC METABOLIC PANEL
Anion gap: 8 (ref 5–15)
BUN: 23 mg/dL (ref 8–23)
CO2: 25 mmol/L (ref 22–32)
Calcium: 9.2 mg/dL (ref 8.9–10.3)
Chloride: 102 mmol/L (ref 98–111)
Creatinine, Ser: 0.85 mg/dL (ref 0.61–1.24)
GFR, Estimated: >60 mL/min (ref >60)
Glucose, Bld: 67 mg/dL — ABNORMAL LOW (ref 70–99)
Potassium: 3.8 mmol/L (ref 3.5–5.1)
Sodium: 135 mmol/L (ref 135–145)

## 2023-11-26 LAB — GLUCOSE, CAPILLARY
Glucose-Capillary: 75 mg/dL (ref 70–99)
Glucose-Capillary: 93 mg/dL (ref 70–99)
Glucose-Capillary: 92 mg/dL (ref 70–99)
Glucose-Capillary: 118 mg/dL — ABNORMAL HIGH (ref 70–99)

## 2023-11-26 MED ORDER — APOAEQUORIN 10 MG PO CAPS: 10.0000 mg | ORAL_CAPSULE | Freq: Every day | ORAL | Status: DC

## 2023-11-26 MED ORDER — GLUCERNA SHAKE PO LIQD
237.0000 mL | Freq: Three times a day (TID) | ORAL | Status: DC
  Administered 2023-11-26 – 2023-12-01 (×12): 237 mL via ORAL
  Filled 2023-11-26 (×17): qty 237

## 2023-11-26 MED ORDER — VITAMIN D 25 MCG (1000 UNIT) PO TABS
1000.0000 [IU] | ORAL_TABLET | Freq: Every day | ORAL | Status: DC
  Administered 2023-11-27 – 2023-12-01 (×5): 1000 [IU] via ORAL
  Filled 2023-11-26 (×5): qty 1

## 2023-11-26 MED ORDER — TAMSULOSIN HCL 0.4 MG PO CAPS
0.4000 mg | ORAL_CAPSULE | Freq: Every day | ORAL | Status: DC
  Administered 2023-11-26 – 2023-12-01 (×6): 0.4 mg via ORAL
  Filled 2023-11-26 (×6): qty 1

## 2023-11-26 NOTE — Progress Notes (Addendum)
 Progress Note   Patient: [name] [name] WUJ:[id number] DOB: [birth year] DOA: 11/25/2023     1 DOS: the patient was seen and examined on 11/26/2023   Brief hospital course:   Assessment and Plan:  Community-acquired pneumonia Patient presented from his assisted living facility with cough and general malaise.  On chest x-ray he was noted to have opacities in the bilateral bases.  Patient remains afebrile and has no leukocytosis. -Ceftriaxone azithromycin  Type 2 diabetes Well-controlled for patient's age.  A1c of 6.8. CBG (last 3)  Recent Labs    11/26/23 0753 11/26/23 1140 11/26/23 1600  GLUCAP 75 93 92   Noted to have fingerstick blood glucose of 44 on admission and on BMP had a blood glucose of 67.  Will hold patient's home insulin regimen.  This can likely be discontinued at discharge.  Will check CBGs every 4 hours given patient is drowsy.  Will add meal supplements.  Macrocytic anemia    Latest Ref Rng & Units 11/26/2023    4:37 AM 11/25/2023    4:40 AM 05/02/2023    3:05 AM  CBC  WBC 4.0 - 10.5 K/uL 8.6  8.1  13.3   Hemoglobin 13.0 - 17.0 g/dL 9.9  95.6  21.3   Hematocrit 39.0 - 52.0 % 31.3  30.7  35.8   Platelets 150 - 400 K/uL 216  237  200   Stable. Will check vitamin b12 and folate in the AM.   H/o CVA Unclear if any residual deficits. Appears to be on ASA 325mg    Cognitive impairment Patient pleasant and not agitated.  Resume prevagen  Continue to monitor.   OSA   Subjective: Patient drowsy, easily awakes to light touch.  Difficult to understand speech however.  Does not have pain.  Physical Exam: Vitals:   11/25/23 2113 11/26/23 0415 11/26/23 1146 11/26/23 1607  BP: (!) 147/79 123/69 114/62 (!) 144/75  Pulse: 81 66 (!) 57 (!) 57  Resp: 20 20 18    Temp: 98.7 F (37.1 C) 98.6 F (37 C) 98 F (36.7 C) 98.1 F (36.7 C)  TempSrc: Axillary Axillary Oral Oral  SpO2: 97% 100% 99% 99%  Weight:      Height:       Physical Exam  Constitutional: In  no distress.  Thin, chronically ill-appearing Cardiovascular: Normal rate, regular rhythm. No lower extremity edema  Pulmonary: Non labored breathing on room air, no wheezing. Rales in the bilateral bases.   Abdominal: Soft. Normal bowel sounds. Non distended and non tender Neurological: Wakes to light touch, unable to understand speech Skin: Skin is warm and dry.   Data Reviewed:     Latest Ref Rng & Units 11/26/2023    4:37 AM 11/25/2023   10:29 AM 11/25/2023    4:40 AM  BMP  Glucose 70 - 99 mg/dL 67  086  578   BUN 8 - 23 mg/dL 23  29  31    Creatinine 0.61 - 1.24 mg/dL 4.69  6.29  5.28   Sodium 135 - 145 mmol/L 135  135  134   Potassium 3.5 - 5.1 mmol/L 3.8  3.6  3.7   Chloride 98 - 111 mmol/L 102  102  99   CO2 22 - 32 mmol/L 25  24  26    Calcium 8.9 - 10.3 mg/dL 9.2  8.7  9.2       Latest Ref Rng & Units 11/26/2023    4:37 AM 11/25/2023    4:40 AM 05/02/2023  3:05 AM  CBC  WBC 4.0 - 10.5 K/uL 8.6  8.1  13.3   Hemoglobin 13.0 - 17.0 g/dL 9.9  45.4  09.8   Hematocrit 39.0 - 52.0 % 31.3  30.7  35.8   Platelets 150 - 400 K/uL 216  237  200      Family Communication: called number listed for daughter and spouse with no answer.   Disposition: Status is: Inpatient Remains inpatient appropriate because: Pneumonia   Planned Discharge Destination:  Back to assisted living facility once medically able    Time spent: 35 minutes  Author: Marolyn Haller, MD 11/26/2023 4:35 PM  For on call review [email or web address].

## 2023-11-26 NOTE — Progress Notes (Signed)
 Chaplain attempted to visit [name] [name], but he was sleeping.  Chaplain called his daughter to introduce spiritual care services, but she was unavailable.

## 2023-11-26 NOTE — Plan of Care (Signed)

## 2023-11-26 NOTE — Progress Notes (Signed)
 PT Cancellation Note  Patient Details Name: Currie Dennin MRN: [id number] DOB: 09/18/1928   Cancelled Treatment:    Reason Eval/Treat Not Completed: Fatigue/lethargy limiting ability to participate (pt sleeping soundly, he did not arouse to verbal, nor tactile stimuli, nor to sternal rub. H&P stated pt is bedbound. Attempted to call wife to obtain pt's prior functional level, but there was no answer.) Will follow.   Ralene Bathe Kistler PT 11/26/2023  Acute Rehabilitation Services  Office [phone]

## 2023-11-27 DIAGNOSIS — J189 Pneumonia, unspecified organism: Secondary | ICD-10-CM | POA: Diagnosis not present

## 2023-11-27 LAB — BASIC METABOLIC PANEL
Anion gap: 9 (ref 5–15)
BUN: 21 mg/dL (ref 8–23)
CO2: 26 mmol/L (ref 22–32)
Calcium: 9.7 mg/dL (ref 8.9–10.3)
Chloride: 98 mmol/L (ref 98–111)
Creatinine, Ser: 0.97 mg/dL (ref 0.61–1.24)
GFR, Estimated: >60 mL/min (ref >60)
Glucose, Bld: 137 mg/dL — ABNORMAL HIGH (ref 70–99)
Potassium: 3.8 mmol/L (ref 3.5–5.1)
Sodium: 133 mmol/L — ABNORMAL LOW (ref 135–145)

## 2023-11-27 LAB — GLUCOSE, CAPILLARY
Glucose-Capillary: 110 mg/dL — ABNORMAL HIGH (ref 70–99)
Glucose-Capillary: 142 mg/dL — ABNORMAL HIGH (ref 70–99)
Glucose-Capillary: 147 mg/dL — ABNORMAL HIGH (ref 70–99)
Glucose-Capillary: 164 mg/dL — ABNORMAL HIGH (ref 70–99)
Glucose-Capillary: 180 mg/dL — ABNORMAL HIGH (ref 70–99)
Glucose-Capillary: 183 mg/dL — ABNORMAL HIGH (ref 70–99)

## 2023-11-27 LAB — IRON AND TIBC
Iron: 29 ug/dL — ABNORMAL LOW (ref 45–182)
Saturation Ratios: 17 % — ABNORMAL LOW (ref 17.9–39.5)
TIBC: 175 ug/dL — ABNORMAL LOW (ref 250–450)
UIBC: 146 ug/dL

## 2023-11-27 LAB — CBC
HCT: 34.1 % — ABNORMAL LOW (ref 39.0–52.0)
Hemoglobin: 11.2 g/dL — ABNORMAL LOW (ref 13.0–17.0)
MCH: 32.7 pg (ref 26.0–34.0)
MCHC: 32.8 g/dL (ref 30.0–36.0)
MCV: 99.4 fL (ref 80.0–100.0)
Platelets: 266 10*3/uL (ref 150–400)
RBC: 3.43 MIL/uL — ABNORMAL LOW (ref 4.22–5.81)
RDW: 12.8 % (ref 11.5–15.5)
WBC: 8.5 10*3/uL (ref 4.0–10.5)
nRBC: 0 % (ref 0.0–0.2)

## 2023-11-27 LAB — URINE CULTURE: Culture: 80000 — AB

## 2023-11-27 LAB — PROCALCITONIN: Procalcitonin: <0.1 ng/mL

## 2023-11-27 LAB — BLOOD GAS, VENOUS
Acid-Base Excess: 3.3 mmol/L — ABNORMAL HIGH (ref 0.0–2.0)
Bicarbonate: 27.8 mmol/L (ref 20.0–28.0)
Drawn by: 7020
O2 Saturation: 84.7 %
Patient temperature: 36.7
pCO2, Ven: 41 mmHg — ABNORMAL LOW (ref 44–60)
pH, Ven: 7.44 — ABNORMAL HIGH (ref 7.25–7.43)
pO2, Ven: 48 mmHg — ABNORMAL HIGH (ref 32–45)

## 2023-11-27 LAB — VITAMIN B12: Vitamin B-12: 1893 pg/mL — ABNORMAL HIGH (ref 180–914)

## 2023-11-27 LAB — FOLATE: Folate: 23.4 ng/mL (ref >5.9)

## 2023-11-27 LAB — FERRITIN: Ferritin: 250 ng/mL (ref 24–336)

## 2023-11-27 MED ORDER — AZITHROMYCIN 250 MG PO TABS
500.0000 mg | ORAL_TABLET | Freq: Every day | ORAL | Status: AC
  Administered 2023-11-28 – 2023-11-29 (×2): 500 mg via ORAL
  Filled 2023-11-27 (×2): qty 2

## 2023-11-27 NOTE — Evaluation (Addendum)
 Physical Therapy Evaluation Patient Details Name: Oluwaferanmi Wain MRN: [id number] DOB: 06-20-1929 Today's Date: 11/27/2023  History of Present Illness  Reyce Lubeck is a 89 y.o. male being admitted to the hospital with bilateral community-acquired pneumonia and associated acute hypoxic respiratory failure. PMH: diabetes, dementia, CVA, essential tremor, stroke  Clinical Impression  Pt admitted with above diagnosis. Per chart review and conversation with staff who spoke with family and facility, pt from ALF, transferring bed<>w/c with assistance. On eval, pt needing mod A to upright trunk to sitting EOB and scoot out to EOB. Once seated EOB pt needing BUE support to support self. Pt needing mod A to power up to standing, maintains forward flexed trunk, then able to take step over to recliner at bedside and HHA for support. Pt up in recliner, noted to be soiled with BM; MD into room so therapist stepped out for linen and nursing assist. Returned to room and pt needing mod A to power up with RW, able to maintain static standing with min A to support while nursing assisted with pericare. Recommend return to ALF with HHPT and staff assisting as needed. Pt currently with functional limitations due to the deficits listed below (see PT Problem List). Pt will benefit from acute skilled PT to increase their independence and safety with mobility to allow discharge.           If plan is discharge home, recommend the following: A lot of help with walking and/or transfers;A lot of help with bathing/dressing/bathroom;Assistance with cooking/housework;Assist for transportation   Can travel by private vehicle        Equipment Recommendations None recommended by PT  Recommendations for Other Services       Functional Status Assessment Patient has had a recent decline in their functional status and demonstrates the ability to make significant improvements in function in a reasonable and  predictable amount of time.     Precautions / Restrictions Precautions Precautions: Fall Restrictions Weight Bearing Restrictions Per Provider Order: No      Mobility  Bed Mobility Overal bed mobility: Needs Assistance Bed Mobility: Supine to Sit     Supine to sit: Mod assist, HOB elevated, Used rails     General bed mobility comments: pts HOB elevated, heavy use of bedrails, mod A to upright trunk into sitting, pt able to inch BLE towards EOB, increased time and effort to scoot out to EOB with close supv for safety    Transfers Overall transfer level: Needs assistance Equipment used: 1 person hand held assist, Rolling walker (2 wheels) Transfers: Sit to/from Stand, Bed to chair/wheelchair/BSC Sit to Stand: Mod assist Stand pivot transfers: Mod assist         General transfer comment: mod A to power to standing, verbal cues for hand placement with HHA and wtih RW, maintaisn forward flexed trunk, mod A to pivot to recliner; noted to be soiled with BM with transfer into recliner, able to perform 2 STS reps with RW, mod A to power up and shift weight, able to stand staticaly with min A for ~2 minutes while nursing assisted with pericare    Ambulation/Gait                  Stairs            Wheelchair Mobility     Tilt Bed    Modified Rankin (Stroke Patients Only)       Balance Overall balance assessment: Needs assistance Sitting-balance support: Feet supported,  Bilateral upper extremity supported Sitting balance-Leahy Scale: Fair     Standing balance support: Reliant on assistive device for balance, During functional activity, Bilateral upper extremity supported Standing balance-Leahy Scale: Poor                               Pertinent Vitals/Pain Pain Assessment Pain Assessment: No/denies pain    Home Living Family/patient expects to be discharged to:: Assisted living                   Additional Comments: per chart  review, pt from [facility]; per [facility] facility states pt was able to transfer bed<>w/c with assistance    Prior Function Prior Level of Function : Patient poor historian/Family not available                     Extremity/Trunk Assessment   Upper Extremity Assessment Upper Extremity Assessment: Defer to OT evaluation    Lower Extremity Assessment Lower Extremity Assessment: Generalized weakness (AROM WFL, strength grossly 3+/5, denies numbness/tingling throughout)    Cervical / Trunk Assessment Cervical / Trunk Assessment: Kyphotic  Communication   Communication Factors Affecting Communication: Reduced clarity of speech    Cognition Arousal: Alert Behavior During Therapy: WFL for tasks assessed/performed   PT - Cognitive impairments: History of cognitive impairments                       PT - Cognition Comments: pt able to state name appropriately, reports he lives at home with spouse, reports currently at home and unaware of being in hospital, reports age is 74         Cueing       General Comments      Exercises     Assessment/Plan    PT Assessment Patient needs continued PT services  PT Problem List Decreased strength;Decreased knowledge of use of DME;Decreased activity tolerance;Decreased balance;Decreased mobility;Decreased cognition;Decreased safety awareness;Cardiopulmonary status limiting activity       PT Treatment Interventions DME instruction;Gait training;Functional mobility training;Therapeutic activities;Therapeutic exercise;Balance training;Cognitive remediation;Patient/family education;Wheelchair mobility training    PT Goals (Current goals can be found in the Care Plan section)  Acute Rehab PT Goals Patient Stated Goal: get out of bed PT Goal Formulation: With patient Time For Goal Achievement: 12/11/23 Potential to Achieve Goals: Good    Frequency Min 2X/week     Co-evaluation               AM-PAC PT "6 Clicks"  Mobility  Outcome Measure Help needed turning from your back to your side while in a flat bed without using bedrails?: A Lot Help needed moving from lying on your back to sitting on the side of a flat bed without using bedrails?: A Lot Help needed moving to and from a bed to a chair (including a wheelchair)?: A Lot Help needed standing up from a chair using your arms (e.g., wheelchair or bedside chair)?: A Lot Help needed to walk in hospital room?: Total Help needed climbing 3-5 steps with a railing? : Total 6 Click Score: 10    End of Session Equipment Utilized During Treatment: Gait belt Activity Tolerance: Patient tolerated treatment well Patient left: in chair;with call bell/phone within reach;with chair alarm set Nurse Communication: Mobility status PT Visit Diagnosis: Other abnormalities of gait and mobility (R26.89);Muscle weakness (generalized) (M62.81)    Time: 1120-1200 PT Time Calculation (min) (ACUTE ONLY):  40 min (29 minutes; stepped out 11:39-11:50 for MD)   Charges:   PT Evaluation $PT Eval Low Complexity: 1 Low PT Treatments $Therapeutic Activity: 8-22 mins PT General Charges $$ ACUTE PT VISIT: 1 Visit          Tori Jamita Mckelvin PT, DPT 11/27/23, 12:35 PM

## 2023-11-27 NOTE — TOC Initial Note (Addendum)
 Transition of Care [facility]) - Initial/Assessment Note    Patient Details  Name: [name] [name] MRN: [id number] Date of Birth: 1928-12-27  Transition of [facility]) CM/SW Contact:    Adrian Prows, RN Phone Number: 11/27/2023, 12:50 PM  Clinical Narrative:                 Sherron Monday w/ pt in room; he requested that his dtr be contacted for info; called his dtr Abisai Deer [phone]); she says pt lives at [facility]; she plans for him to return at d/c; she says he will need ambulance transport back to facility; Ms Fritchman verified pt's Insurance/PCP; she denies pt experiencing SDOH risk; pt has walker, wheelchair, and cpap; he also has glasses, dentures (upper/lower); she would like for him to have HHPT; spoke w/ Wilnette Kales, Environmental education officer at facility; she verified pt's residency and level of care; she says pt can transfer w/ assist; she also says Chip Bo[facility] is Public relations account executive; she says pt can return, and the facility does not accept pt's over weekends; Marylene Land at [facility] notified; TOC is following.  -1317- pt's dtr notified HHPT ordered, and facility has contract agency; she agrees to pt receiving recc therapy and use of contract agency.  Expected Discharge Plan: Skilled Nursing Facility Barriers to Discharge: Continued Medical Work up   Patient Goals and CMS Choice Patient states their goals for this hospitalization and ongoing recovery are:: pt's dtr Elmer Merwin says he will return to [facility].gov Compare Post Acute Care list provided to:: Patient Represenative (must comment) Joetta Manners (dtr))   [place] ownership interest in [facility].provided to:: Adult Children    Expected Discharge Plan and Services   Discharge Planning Services: CM Consult   Living arrangements for the past 2 months: Assisted Living Facility                                      Prior Living Arrangements/Services Living arrangements for the  past 2 months: Assisted Living Facility Lives with:: Facility Resident Patient language and need for interpreter reviewed:: Yes Do you feel safe going back to the place where you live?: Yes      Need for Family Participation in Patient Care: Yes (Comment) Care giver support system in place?: Yes (comment) Current home services: DME (walker, wheelchair, cpap) Criminal Activity/Legal Involvement Pertinent to Current Situation/Hospitalization: No - Comment as needed  Activities of Daily Living   ADL Screening (condition at time of admission) Independently performs ADLs?: No Does the patient have a NEW difficulty with bathing/dressing/toileting/self-feeding that is expected to last >3 days?: No Does the patient have a NEW difficulty with getting in/out of bed, walking, or climbing stairs that is expected to last >3 days?: No Does the patient have a NEW difficulty with communication that is expected to last >3 days?: No Is the patient deaf or have difficulty hearing?: No Does the patient have difficulty seeing, even when wearing glasses/contacts?: No Does the patient have difficulty concentrating, remembering, or making decisions?: Yes  Permission Sought/Granted Permission sought to share information with : Case Manager Permission granted to share information with : Yes, Verbal Permission Granted  Share Information with NAME: Case Manager     Permission granted to share info w Relationship: Spiros Greenfeld (dtr) [phone]     Emotional Assessment Appearance:: Appears older than stated age, Appears stated age, Appears younger than stated age  Attitude/Demeanor/Rapport: Paranoid Affect (typically observed): Accepting Orientation: : Oriented to Self, Oriented to Place, Oriented to  Time, Oriented to Situation Alcohol / Substance Use: Not Applicable Psych Involvement: No (comment)  Admission diagnosis:  CAP (community acquired pneumonia) [J18.9] Acute cystitis without hematuria  [N30.00] Pneumonia of right lower lobe due to infectious organism [J18.9] Sepsis, due to unspecified organism, unspecified whether acute organ dysfunction present [facility]) [A41.9] Patient Active Problem List   Diagnosis Date Noted   Dyspnea on exertion 09/04/2022   AMS (altered mental status) 08/15/2022   Anemia in chronic kidney disease 11/28/2020   Chronic kidney disease due to hypertension 11/28/2020   Chronic sinusitis 11/28/2020   Chronic venous insufficiency 11/28/2020   Essential hypertension 11/28/2020   Gastroesophageal reflux disease 11/28/2020   Hypertrophy of prostate without urinary obstruction and other lower urinary tract symptoms (LUTS) 11/28/2020   Hypoglycemia due to type 2 diabetes mellitus (HCC) 11/28/2020   Insomnia 11/28/2020   Ischemic optic neuropathy 11/28/2020   Late effects of cerebrovascular disease 11/28/2020   Sequela, post-stroke 11/28/2020   Long term (current) use of insulin (HCC) 11/28/2020   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   Other specified cardiac dysrhythmias 11/28/2020   Proteinuria 11/28/2020   Other psoriasis 11/28/2020   Psoriasis 11/28/2020   Vision loss, central, left 10/13/2018   Tremor, essential 10/13/2018   Late onset Alzheimer's disease without behavioral disturbance (HCC) 10/13/2018   Osteoarthritis of knee 10/01/2017   Stroke (cerebrum) (HCC)    Acute CVA (cerebrovascular accident) (HCC) 04/05/2017   CAP (community acquired pneumonia) 04/04/2017   Acute metabolic encephalopathy 04/04/2017   Dementia (HCC) 04/04/2017   Diabetes mellitus (HCC) 04/04/2017   Hyperlipidemia 04/04/2017   Change in bowel habits 11/06/2016   Umbilical hernia without obstruction and without gangrene 11/06/2016   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 AT [address] [place] [address] Phone: [phone] Fax: [phone]     Social Drivers of Health (SDOH) Social History: SDOH Screenings   Food Insecurity: No Food Insecurity (11/27/2023)  Housing: Low Risk  (11/27/2023)  Transportation Needs: No Transportation Needs (11/27/2023)  Utilities: Not At Risk (11/27/2023)  Social Connections: Moderately Integrated (11/25/2023)  Tobacco Use: Low Risk  (11/25/2023)   SDOH Interventions: Food Insecurity Interventions: Intervention Not Indicated, Inpatient TOC Housing Interventions: Intervention Not Indicated, Inpatient TOC Transportation Interventions: Intervention Not Indicated, Inpatient TOC Utilities Interventions: Intervention Not Indicated, Inpatient TOC   Readmission Risk Interventions     No data to display

## 2023-11-27 NOTE — Progress Notes (Addendum)
 Progress Note   Patient: [name] [name] ZOX:[id number] DOB: Mar 05, 1929 DOA: 11/25/2023     2 DOS: the patient was seen and examined on 11/27/2023   Brief hospital course:   Assessment and Plan:  Community-acquired pneumonia Patient presented from his assisted living facility with cough and general malaise.  On chest x-ray he was noted to have opacities in the bilateral bases.  Patient remains afebrile and has no leukocytosis. -Continue ceftriaxone and azithromycin   Type 2 diabetes Well-controlled for patient's age.  A1c of 6.8. CBG (last 3)  Recent Labs    11/27/23 0748 11/27/23 1243 11/27/23 1614  GLUCAP 110* 180* 164*   Noted to have fingerstick blood glucose of 44 on admission and on BMP had a blood glucose of 67.  Will hold patient's home insulin regimen.  This can likely be discontinued at discharge.   Patient more awake will change CBG to meal time.   Macrocytic anemia    Latest Ref Rng & Units 11/27/2023    4:33 AM 11/26/2023    4:37 AM 11/25/2023    4:40 AM  CBC  WBC 4.0 - 10.5 K/uL 8.5  8.6  8.1   Hemoglobin 13.0 - 17.0 g/dL 81.1  9.9  91.4   Hematocrit 39.0 - 52.0 % 34.1  31.3  30.7   Platelets 150 - 400 K/uL 266  216  237   Stable. B12 is WNL, folate WNL, iron levels mildly decreased.  Will continue to monitor  H/o CVA Unclear if any residual deficits. Appears to be on ASA 325mg . Will continue this.   Cognitive impairment Patient pleasant and not agitated.  Continue prevagen  Continue to monitor.   OSA   Subjective: More awake this AM. Sitting in his chair. Able to converse without difficulty. Denies shortness of breath and states he feels better.   Physical Exam: Vitals:   11/26/23 2007 11/27/23 0002 11/27/23 0445 11/27/23 0746  BP: 138/78 (!) 144/95 (!) 158/93   Pulse: 74 74 69   Resp: 17 19 16    Temp: 98.2 F (36.8 C) (!) 97.5 F (36.4 C) 97.9 F (36.6 C) 98.1 F (36.7 C)  TempSrc: Axillary   Oral  SpO2: 98% 97% 92%   Weight:       Height:       Physical Exam  Constitutional: In no distress. Thin.  Cardiovascular: Normal rate, regular rhythm. No lower extremity edema  Pulmonary: Non labored breathing on room air, no wheezing or rales.  Abdominal: Soft. Normal bowel sounds. Non distended and non tender. Umbilical hernia, contents soft and easily reduced  Neurological: Alert and oriented to person, more awake and speech more clear.   Skin: Skin is warm and dry.   Data Reviewed:     Latest Ref Rng & Units 11/27/2023    4:33 AM 11/26/2023    4:37 AM 11/25/2023   10:29 AM  BMP  Glucose 70 - 99 mg/dL 782  67  956   BUN 8 - 23 mg/dL 21  23  29    Creatinine 0.61 - 1.24 mg/dL 2.13  0.86  5.78   Sodium 135 - 145 mmol/L 133  135  135   Potassium 3.5 - 5.1 mmol/L 3.8  3.8  3.6   Chloride 98 - 111 mmol/L 98  102  102   CO2 22 - 32 mmol/L 26  25  24    Calcium 8.9 - 10.3 mg/dL 9.7  9.2  8.7       Latest Ref  Rng & Units 11/27/2023    4:33 AM 11/26/2023    4:37 AM 11/25/2023    4:40 AM  CBC  WBC 4.0 - 10.5 K/uL 8.5  8.6  8.1   Hemoglobin 13.0 - 17.0 g/dL 78.4  9.9  69.6   Hematocrit 39.0 - 52.0 % 34.1  31.3  30.7   Platelets 150 - 400 K/uL 266  216  237      Family Communication: None at bedside   Disposition: Status is: Inpatient Remains inpatient appropriate because: Safe discharge plan, possibly need SNF  Planned Discharge Destination:  Back to assisted living facility once medically able    Time spent: 30 minutes  Author: Marolyn Haller, MD 11/27/2023 5:52 PM  For on call review [email or web address].

## 2023-11-27 NOTE — Progress Notes (Signed)
 PHARMACIST - PHYSICIAN COMMUNICATION DR:   Marolyn Haller CONCERNING: Antibiotic IV to Oral Route Change Policy  RECOMMENDATION: This patient is receiving azithromycin by the intravenous route.  Based on criteria approved by the Pharmacy and Therapeutics Committee, the antibiotic(s) is/are being converted to the equivalent oral dose form(s).   DESCRIPTION: These criteria include: Patient being treated for a respiratory tract infection, urinary tract infection, cellulitis or clostridium difficile associated diarrhea if on metronidazole The patient is not neutropenic and does not exhibit a GI malabsorption state The patient is eating (either orally or via tube) and/or has been taking other orally administered medications for a least 24 hours The patient is improving clinically and has a Tmax < 100.5  If you have questions about this conversion, please contact the Pharmacy Department  []   [phone] )  [facility] []   [phone] )  [facility]  []   [phone] )  [facility] [x]   [phone] )  [facility]   Thank you for allowing pharmacy to be a part of this patient's care.   Lyn Henri, [facility] PharmD Candidate 11/27/2023 2:08 PM

## 2023-11-28 DIAGNOSIS — J189 Pneumonia, unspecified organism: Secondary | ICD-10-CM | POA: Diagnosis not present

## 2023-11-28 LAB — GLUCOSE, CAPILLARY
Glucose-Capillary: 101 mg/dL — ABNORMAL HIGH (ref 70–99)
Glucose-Capillary: 156 mg/dL — ABNORMAL HIGH (ref 70–99)
Glucose-Capillary: 141 mg/dL — ABNORMAL HIGH (ref 70–99)
Glucose-Capillary: 175 mg/dL — ABNORMAL HIGH (ref 70–99)

## 2023-11-28 NOTE — Progress Notes (Signed)
 PROGRESS NOTE    [name] [name]  OAC:[id number] DOB: [birth year] DOA: 11/25/2023 PCP: [name], MD   Brief Narrative:  [name] [name] is a 89 y.o. male with medical history significant for insulin-dependent diabetes, dementia being admitted to the hospital with bilateral community-acquired pneumonia and associated acute hypoxic respiratory failure.    Assessment & Plan:   Principal Problem:   CAP (community acquired pneumonia)   Community-acquired pneumonia Patient presented from his assisted living facility with cough and general malaise.  On chest x-ray he was noted to have opacities in the bilateral bases.  Patient remains afebrile and has no leukocytosis. -Continue ceftriaxone and azithromycin    Type 2 diabetes Well-controlled for patient's age.  A1c of 6.8. Noted to have fingerstick blood glucose of 44 on admission and on BMP had a blood glucose of 67.  Will hold patient's home insulin regimen.  This can likely be discontinued at discharge.   Patient more awake will change CBG to meal time.    Macrocytic anemia Stable. B12 is WNL Folate WNL, iron levels mildly decreased.   Prior CVA Continue aspirin   Cognitive impairment At baseline; Continue prevagen    DVT prophylaxis: enoxaparin (LOVENOX) injection 40 mg Start: 11/25/23 0845   Code Status:   Code Status: Limited: Do not attempt resuscitation (DNR) -DNR-LIMITED -Do Not Intubate/DNI   Family Communication: None present  Status is: Inpatient  Dispo: The patient is from: Assisted living              Anticipated d/c is to: SNF              Anticipated d/c date is: Imminent              Patient currently is medically stable for discharge  Consultants:  None  Procedures:  None  Antimicrobials:  Azithromycin, ceftriaxone  Subjective: No acute issues or events overnight, review of systems somewhat limited by patient's mental status  Objective: Vitals:   11/27/23 0445 11/27/23 0746  11/27/23 2223 11/28/23 0553  BP: (!) 158/93  123/70 114/68  Pulse: 69  67 66  Resp: 16   16  Temp: 97.9 F (36.6 C) 98.1 F (36.7 C)  98 F (36.7 C)  TempSrc:  Oral  Oral  SpO2: 92%  100% 100%  Weight:      Height:        Intake/Output Summary (Last 24 hours) at 11/28/2023 0756 Last data filed at 11/27/2023 1230 Gross per 24 hour  Intake 480 ml  Output 320 ml  Net 160 ml   Filed Weights   11/25/23 0546  Weight: 74.8 kg    Examination:  General:  Pleasantly resting in bed, No acute distress.  Alert to person only HEENT:  Normocephalic atraumatic.  Sclerae nonicteric, noninjected.  Extraocular movements intact bilaterally. Neck:  Without mass or deformity.  Trachea is midline. Lungs: Diffuse rhonchi without wheeze or rales. Heart:  Regular rate and rhythm.  Without murmurs, rubs, or gallops. Abdomen:  Soft, nontender, nondistended.  Without guarding or rebound. Extremities: Without cyanosis, clubbing, edema, or obvious deformity. Skin:  Warm and dry, no erythema.  Data Reviewed: I have personally reviewed following labs and imaging studies  CBC: Recent Labs  Lab 11/25/23 0440 11/26/23 0437 11/27/23 0433  WBC 8.1 8.6 8.5  NEUTROABS 6.6  --   --   HGB 10.0* 9.9* 11.2*  HCT 30.7* 31.3* 34.1*  MCV 98.4 103.6* 99.4  PLT 237 216 266   Basic Metabolic  Panel: Recent Labs  Lab 11/25/23 0440 11/25/23 1029 11/26/23 0437 11/27/23 0433  NA 134* 135 135 133*  K 3.7 3.6 3.8 3.8  CL 99 102 102 98  CO2 26 24 25 26   GLUCOSE 150* 125* 67* 137*  BUN 31* 29* 23 21  CREATININE 1.09 1.01 0.85 0.97  CALCIUM 9.2 8.7* 9.2 9.7   GFR: Estimated Creatinine Clearance: 48.6 mL/min (by C-G formula based on SCr of 0.97 mg/dL). Liver Function Tests: Recent Labs  Lab 11/25/23 1029  AST 12*  ALT 8  ALKPHOS 38  BILITOT 0.9  PROT 5.3*  ALBUMIN 2.6*   Coagulation Profile: Recent Labs  Lab 11/25/23 0440  INR 1.1   Cardiac Enzymes: No results for input(s): "CKTOTAL",  "CKMB", "CKMBINDEX", "TROPONINI" in the last 168 hours. BNP (last 3 results) No results for input(s): "PROBNP" in the last 8760 hours. HbA1C: Recent Labs    11/25/23 1029  HGBA1C 6.8*   CBG: Recent Labs  Lab 11/27/23 0442 11/27/23 0748 11/27/23 1243 11/27/23 1614 11/27/23 1955  GLUCAP 142* 110* 180* 164* 183*   Lipid Profile: No results for input(s): "CHOL", "HDL", "LDLCALC", "TRIG", "CHOLHDL", "LDLDIRECT" in the last 72 hours. Thyroid Function Tests: No results for input(s): "TSH", "T4TOTAL", "FREET4", "T3FREE", "THYROIDAB" in the last 72 hours. Anemia Panel: Recent Labs    11/27/23 0433  VITAMINB12 1,893*  FOLATE 23.4  FERRITIN 250  TIBC 175*  IRON 29*   Sepsis Labs: Recent Labs  Lab 11/25/23 0453 11/25/23 0746 11/27/23 0433  PROCALCITON  --   --  <0.10  LATICACIDVEN 1.3 0.8  --     Recent Results (from the past 240 hours)  Blood Culture (routine x 2)     Status: None (Preliminary result)   Collection Time: 11/25/23  4:40 AM   Specimen: BLOOD  Result Value Ref Range Status   Specimen Description   Final    BLOOD RIGHT ANTECUBITAL Performed at [facility], [address] [place], [place] [address]    Special Requests   Final    BOTTLES DRAWN AEROBIC AND ANAEROBIC Blood Culture adequate volume Performed at [facility], [address], [place], [place] [address]    Culture   Final    NO GROWTH 2 DAYS Performed at [facility], [address], [place], [place] [address]    Report Status PENDING  Incomplete  Blood Culture (routine x 2)     Status: None (Preliminary result)   Collection Time: 11/25/23  4:46 AM   Specimen: BLOOD LEFT FOREARM  Result Value Ref Range Status   Specimen Description   Final    BLOOD LEFT FOREARM Performed at [facility], [address], [place], [place] [address]    Special Requests   Final    BOTTLES DRAWN AEROBIC AND ANAEROBIC Blood Culture adequate  volume Performed at [facility], [address], [place], [place] [address]    Culture   Final    NO GROWTH 2 DAYS Performed at [facility], [address] [place], [place] [address]    Report Status PENDING  Incomplete  Resp panel by RT-PCR (RSV, Flu A&B, Covid) Anterior Nasal Swab     Status: None   Collection Time: 11/25/23  5:04 AM   Specimen: Anterior Nasal Swab  Result Value Ref Range Status   SARS Coronavirus 2 by RT PCR NEGATIVE NEGATIVE Final    Comment: (NOTE) SARS-CoV-2 target nucleic acids are NOT  DETECTED.  The SARS-CoV-2 RNA is generally detectable in upper respiratory specimens during the acute phase of infection. The lowest concentration of SARS-CoV-2 viral copies this assay can detect is 138 copies/mL. A negative result does not preclude SARS-Cov-2 infection and should not be used as the sole basis for treatment or other patient management decisions. A negative result may occur with  improper specimen collection/handling, submission of specimen other than nasopharyngeal swab, presence of viral mutation(s) within the areas targeted by this assay, and inadequate number of viral copies(<138 copies/mL). A negative result must be combined with clinical observations, patient history, and epidemiological information. The expected result is Negative.  Fact Sheet for Patients:  BloggerCourse.com  Fact Sheet for Healthcare Providers:  SeriousBroker.it  This test is no t yet approved or cleared by the [place] FDA and  has been authorized for detection and/or diagnosis of SARS-CoV-2 by FDA under an Emergency Use Authorization (EUA). This EUA will remain  in effect (meaning this test can be used) for the duration of the COVID-19 declaration under Section 564(b)(1) of the Act, 21 U.S.C.section 360bbb-3(b)(1), unless the authorization is terminated  or revoked sooner.       Influenza A by  PCR NEGATIVE NEGATIVE Final   Influenza B by PCR NEGATIVE NEGATIVE Final    Comment: (NOTE) The Xpert Xpress SARS-CoV-2/FLU/RSV plus assay is intended as an aid in the diagnosis of influenza from Nasopharyngeal swab specimens and should not be used as a sole basis for treatment. Nasal washings and aspirates are unacceptable for Xpert Xpress SARS-CoV-2/FLU/RSV testing.  Fact Sheet for Patients: BloggerCourse.com  Fact Sheet for Healthcare Providers: SeriousBroker.it  This test is not yet approved or cleared by the [place] FDA and has been authorized for detection and/or diagnosis of SARS-CoV-2 by FDA under an Emergency Use Authorization (EUA). This EUA will remain in effect (meaning this test can be used) for the duration of the COVID-19 declaration under Section 564(b)(1) of the Act, 21 U.S.C. section 360bbb-3(b)(1), unless the authorization is terminated or revoked.     Resp Syncytial Virus by PCR NEGATIVE NEGATIVE Final    Comment: (NOTE) Fact Sheet for Patients: BloggerCourse.com  Fact Sheet for Healthcare Providers: SeriousBroker.it  This test is not yet approved or cleared by the [place] FDA and has been authorized for detection and/or diagnosis of SARS-CoV-2 by FDA under an Emergency Use Authorization (EUA). This EUA will remain in effect (meaning this test can be used) for the duration of the COVID-19 declaration under Section 564(b)(1) of the Act, 21 U.S.C. section 360bbb-3(b)(1), unless the authorization is terminated or revoked.  Performed at [facility], [address], [place], [place] [address]   Urine Culture     Status: Abnormal   Collection Time: 11/25/23  5:17 AM   Specimen: Urine, Random  Result Value Ref Range Status   Specimen Description   Final    URINE, RANDOM Performed at [facility], [address], [place], [place] [address]    Special Requests   Final    NONE Reflexed from [id number] Performed at [facility], [address], [place], [place] [address]    Culture 80,000 COLONIES/mL ENTEROCOCCUS FAECALIS (A)  Final   Report Status 11/27/2023 FINAL  Final   Organism ID, Bacteria ENTEROCOCCUS FAECALIS (A)  Final      Susceptibility   Enterococcus faecalis - MIC*    AMPICILLIN <=2 SENSITIVE Sensitive     NITROFURANTOIN <=16 SENSITIVE Sensitive  VANCOMYCIN 1 SENSITIVE Sensitive     * 80,000 COLONIES/mL ENTEROCOCCUS FAECALIS   Radiology Studies: No results found.  Scheduled Meds:  aspirin EC  325 mg Oral Daily   azithromycin  500 mg Oral Daily   cholecalciferol  1,000 Units Oral Daily   citalopram  20 mg Oral Daily   enoxaparin (LOVENOX) injection  40 mg Subcutaneous Q24H   feeding supplement (GLUCERNA SHAKE)  237 mL Oral TID BM   simvastatin  40 mg Oral Daily   tamsulosin  0.4 mg Oral Daily   Continuous Infusions:  cefTRIAXone (ROCEPHIN)  IV 1 g (11/28/23 0533)     LOS: 3 days   Time spent:  Azucena Fallen, DO [facility]  If 7PM-7AM, please contact night-coverage [email or web address]  11/28/2023, 7:56 AM

## 2023-11-28 NOTE — Plan of Care (Signed)

## 2023-11-28 NOTE — Plan of Care (Signed)
 Problem: Education: Goal: Ability to describe self-care measures that may prevent or decrease complications (Diabetes Survival Skills Education) will improve 11/28/2023 0322 by Sharen Counter, RN Outcome: Progressing 11/28/2023 0321 by Sharen Counter, RN Outcome: Progressing Goal: Individualized Educational Video(s) 11/28/2023 0322 by Sharen Counter, RN Outcome: Progressing 11/28/2023 0321 by Sharen Counter, RN Outcome: Progressing   Problem: Coping: Goal: Ability to adjust to condition or change in health will improve 11/28/2023 0322 by Sharen Counter, RN Outcome: Progressing 11/28/2023 0321 by Sharen Counter, RN Outcome: Progressing   Problem: Fluid Volume: Goal: Ability to maintain a balanced intake and output will improve 11/28/2023 0322 by Sharen Counter, RN Outcome: Progressing 11/28/2023 0321 by Sharen Counter, RN Outcome: Progressing   Problem: Health Behavior/Discharge Planning: Goal: Ability to identify and utilize available resources and services will improve 11/28/2023 0322 by Sharen Counter, RN Outcome: Progressing 11/28/2023 0321 by Sharen Counter, RN Outcome: Progressing Goal: Ability to manage health-related needs will improve 11/28/2023 0322 by Sharen Counter, RN Outcome: Progressing 11/28/2023 0321 by Sharen Counter, RN Outcome: Progressing   Problem: Metabolic: Goal: Ability to maintain appropriate glucose levels will improve 11/28/2023 0322 by Sharen Counter, RN Outcome: Progressing 11/28/2023 0321 by Sharen Counter, RN Outcome: Progressing   Problem: Nutritional: Goal: Maintenance of adequate nutrition will improve 11/28/2023 0322 by Sharen Counter, RN Outcome: Progressing 11/28/2023 0321 by Sharen Counter, RN Outcome: Progressing Goal: Progress toward achieving an optimal weight will improve 11/28/2023 0322 by Sharen Counter, RN Outcome: Progressing 11/28/2023 0321 by Sharen Counter, RN Outcome:  Progressing   Problem: Skin Integrity: Goal: Risk for impaired skin integrity will decrease 11/28/2023 0322 by Sharen Counter, RN Outcome: Progressing 11/28/2023 0321 by Sharen Counter, RN Outcome: Progressing   Problem: Tissue Perfusion: Goal: Adequacy of tissue perfusion will improve 11/28/2023 0322 by Sharen Counter, RN Outcome: Progressing 11/28/2023 0321 by Sharen Counter, RN Outcome: Progressing   Problem: Education: Goal: Knowledge of General Education information will improve Description: Including pain rating scale, medication(s)/side effects and non-pharmacologic comfort measures 11/28/2023 0322 by Sharen Counter, RN Outcome: Progressing 11/28/2023 0321 by Sharen Counter, RN Outcome: Progressing   Problem: Health Behavior/Discharge Planning: Goal: Ability to manage health-related needs will improve 11/28/2023 0322 by Sharen Counter, RN Outcome: Progressing 11/28/2023 0321 by Sharen Counter, RN Outcome: Progressing   Problem: Clinical Measurements: Goal: Ability to maintain clinical measurements within normal limits will improve 11/28/2023 0322 by Sharen Counter, RN Outcome: Progressing 11/28/2023 0321 by Sharen Counter, RN Outcome: Progressing Goal: Will remain free from infection 11/28/2023 0322 by Sharen Counter, RN Outcome: Progressing 11/28/2023 0321 by Sharen Counter, RN Outcome: Progressing Goal: Diagnostic test results will improve 11/28/2023 0322 by Sharen Counter, RN Outcome: Progressing 11/28/2023 0321 by Sharen Counter, RN Outcome: Progressing Goal: Respiratory complications will improve 11/28/2023 0322 by Sharen Counter, RN Outcome: Progressing 11/28/2023 0321 by Sharen Counter, RN Outcome: Progressing Goal: Cardiovascular complication will be avoided 11/28/2023 0322 by Sharen Counter, RN Outcome: Progressing 11/28/2023 0321 by Sharen Counter, RN Outcome: Progressing   Problem: Activity: Goal: Risk  for activity intolerance will decrease 11/28/2023 0322 by Sharen Counter, RN Outcome: Progressing 11/28/2023 0321 by Sharen Counter, RN Outcome: Progressing   Problem: Nutrition: Goal: Adequate nutrition will be maintained 11/28/2023 0322 by Sharen Counter, RN Outcome: Progressing 11/28/2023 0321 by Sharen Counter, RN Outcome: Progressing  Problem: Coping: Goal: Level of anxiety will decrease 11/28/2023 0322 by Sharen Counter, RN Outcome: Progressing 11/28/2023 0321 by Sharen Counter, RN Outcome: Progressing   Problem: Elimination: Goal: Will not experience complications related to bowel motility 11/28/2023 0322 by Sharen Counter, RN Outcome: Progressing 11/28/2023 0321 by Sharen Counter, RN Outcome: Progressing Goal: Will not experience complications related to urinary retention 11/28/2023 0322 by Sharen Counter, RN Outcome: Progressing 11/28/2023 0321 by Sharen Counter, RN Outcome: Progressing   Problem: Pain Managment: Goal: General experience of comfort will improve and/or be controlled 11/28/2023 0322 by Sharen Counter, RN Outcome: Progressing 11/28/2023 0321 by Sharen Counter, RN Outcome: Progressing   Problem: Safety: Goal: Ability to remain free from injury will improve 11/28/2023 0322 by Sharen Counter, RN Outcome: Progressing 11/28/2023 0321 by Sharen Counter, RN Outcome: Progressing   Problem: Skin Integrity: Goal: Risk for impaired skin integrity will decrease 11/28/2023 0322 by Sharen Counter, RN Outcome: Progressing 11/28/2023 0321 by Sharen Counter, RN Outcome: Progressing

## 2023-11-29 DIAGNOSIS — J189 Pneumonia, unspecified organism: Secondary | ICD-10-CM | POA: Diagnosis not present

## 2023-11-29 LAB — GLUCOSE, CAPILLARY
Glucose-Capillary: 128 mg/dL — ABNORMAL HIGH (ref 70–99)
Glucose-Capillary: 134 mg/dL — ABNORMAL HIGH (ref 70–99)
Glucose-Capillary: 118 mg/dL — ABNORMAL HIGH (ref 70–99)
Glucose-Capillary: 143 mg/dL — ABNORMAL HIGH (ref 70–99)

## 2023-11-29 NOTE — Plan of Care (Signed)

## 2023-11-29 NOTE — Plan of Care (Signed)
  Problem: Education: Goal: Ability to describe self-care measures that may prevent or decrease complications (Diabetes Survival Skills Education) will improve Outcome: Not Progressing Goal: Individualized Educational Video(s) Outcome: Not Progressing   Problem: Coping: Goal: Ability to adjust to condition or change in health will improve Outcome: Progressing   Problem: Fluid Volume: Goal: Ability to maintain a balanced intake and output will improve Outcome: Progressing   Problem: Metabolic: Goal: Ability to maintain appropriate glucose levels will improve Outcome: Progressing   Problem: Nutritional: Goal: Maintenance of adequate nutrition will improve Outcome: Progressing   Problem: Skin Integrity: Goal: Risk for impaired skin integrity will decrease Outcome: Progressing   Problem: Tissue Perfusion: Goal: Adequacy of tissue perfusion will improve Outcome: Progressing   Problem: Education: Goal: Knowledge of General Education information will improve Description: Including pain rating scale, medication(s)/side effects and non-pharmacologic comfort measures Outcome: Not Progressing   Problem: Health Behavior/Discharge Planning: Goal: Ability to manage health-related needs will improve Outcome: Not Progressing   Problem: Pain Managment: Goal: General experience of comfort will improve and/or be controlled Outcome: Progressing   Problem: Safety: Goal: Ability to remain free from injury will improve Outcome: Progressing   Problem: Skin Integrity: Goal: Risk for impaired skin integrity will decrease Outcome: Progressing

## 2023-11-29 NOTE — Discharge Summary (Signed)
 Physician Discharge Summary  [name] [name] VQQ:[id number] DOB: [birth year] DOA: 11/25/2023  PCP: [name], MD  Admit date: 11/25/2023 Discharge date: 11/30/2023  Admitted From: ALF Disposition:  Same  Recommendations for Outpatient Follow-up:  Follow up with PCP in 1-2 weeks  Discharge Condition:Stable  CODE STATUS:DNR  Diet recommendation: dysphagia 3 mechanical soft w/ground meat.   Brief/Interim Summary: [name] [name] is a 89 y.o. male with medical history significant for insulin-dependent diabetes, dementia being admitted to the hospital with bilateral community-acquired pneumonia and associated acute hypoxic respiratory failure.   Fortunately patient's respiratory status resolved rapidly and he remained stable ORA. Denies SOB with exertion. Completed antibiotic course prior to dc.  Appears to be at physical baseline which is physically very deconditioned. PT evaluated and recommended HH.   Otherwise continued on home medications at dc.    Consultations: None  Procedures/Studies: DG Chest Port 1 View Result Date: 11/25/2023 CLINICAL DATA:  Questionable sepsis EXAM: PORTABLE CHEST 1 VIEW COMPARISON:  05/02/2019 FINDINGS: Infiltrate in the lower lungs since prior. No Kerley lines, effusion, or pneumothorax. Normal heart size and mediastinal contours. IMPRESSION: Pneumonia at both lung bases. Electronically Signed   By: Tiburcio Pea M.D.   On: 11/25/2023 07:05   Allergies as of 11/30/2023       Reactions   Donepezil Other (See Comments), Shortness Of Breath, Swelling   Other reaction(s): shortness of breath Other Reaction(s): Not available   Lisinopril Anaphylaxis, Shortness Of Breath, Swelling   HAD TO COME TO THE E.D. AFTER TAKING THIS!! Other reaction(s): angioedema Other Reaction(s): Not available   Methocarbamol Anaphylaxis, Shortness Of Breath, Swelling   HAD TO COME TO THE E.D. AFTER TAKING THIS!! Other reaction(s): OTHER REACTION,  swelling Other Reaction(s): Not available   Namenda [memantine Hcl] Shortness Of Breath, Swelling   Other    Other reaction(s): shortness of breath        Medication List     STOP taking these medications    simvastatin 40 MG tablet Commonly known as: ZOCOR       TAKE these medications    Accu-Chek SmartView test strip Generic drug: glucose blood USE ONCE A DAY AS DIRECTED   aspirin EC 325 MG tablet Take 1 tablet (325 mg total) by mouth daily. What changed: Another medication with the same name was removed. Continue taking this medication, and follow the directions you see here.   B-D ULTRAFINE III SHORT PEN 31G X 8 MM Misc Generic drug: Insulin Pen Needle   citalopram 20 MG tablet Commonly known as: CELEXA TAKE 1 TABLET (20 MG TOTAL) BY MOUTH DAILY.   cyanocobalamin 500 MCG tablet Commonly known as: VITAMIN B12 Take 500 mcg by mouth daily.   FreeStyle [employer] 2 Sensor Misc See admin instructions.   insulin glargine 100 UNIT/ML injection Commonly known as: LANTUS Inject 4 Units into the skin daily after supper.   LANSOPRAZOLE PO Take 10 mg by mouth every 12 (twelve) hours as needed (Acid reflux).   loratadine-pseudoephedrine 10-240 MG 24 hr tablet Commonly known as: CLARITIN-D 24-hour Take 1 tablet by mouth daily as needed for allergies.   multivitamin with minerals Tabs tablet Take 1 tablet by mouth daily.   Prevagen 10 MG Caps Generic drug: Apoaequorin Take 10 mg by mouth daily.   tamsulosin 0.4 MG Caps capsule Commonly known as: FLOMAX Take 0.4 mg by mouth daily.   VITAMIN D-3 PO Take 1,000 Units by mouth daily.        Subjective: patient reports  feeling well. Denies discomfort. No SOB or CP.   Discharge Exam: Vitals:   11/30/23 0454 11/30/23 1254  BP: 130/75 109/64  Pulse: 71 72  Resp: 16 17  Temp: 97.7 F (36.5 C) 97.8 F (36.6 C)  SpO2: 99% 98%   Vitals:   11/29/23 1200 11/29/23 1912 11/30/23 0454 11/30/23 1254  BP: 110/63  125/75 130/75 109/64  Pulse: 66 66 71 72  Resp: 16 18 16 17   Temp: 97.9 F (36.6 C) 97.8 F (36.6 C) 97.7 F (36.5 C) 97.8 F (36.6 C)  TempSrc: Oral   Oral  SpO2: 98% 99% 99% 98%  Weight:      Height:        General: Pt is alert, awake, not in acute distress Cardiovascular: RRR, S1/S2 +, no rubs, no gallops Respiratory: CTA bilaterally, no wheezing, no rhonchi Abdominal: Soft, NT, ND, bowel sounds + Extremities: no edema, no cyanosis GU: foley in place   The results of significant diagnostics from this hospitalization (including imaging, microbiology, ancillary and laboratory) are listed below for reference.     Microbiology: Recent Results (from the past 240 hours)  Blood Culture (routine x 2)     Status: None   Collection Time: 11/25/23  4:40 AM   Specimen: BLOOD  Result Value Ref Range Status   Specimen Description   Final    BLOOD RIGHT ANTECUBITAL Performed at [facility], [address], [place], [place] [address]    Special Requests   Final    BOTTLES DRAWN AEROBIC AND ANAEROBIC Blood Culture adequate volume Performed at [facility], [address], [place], [place] [address]    Culture   Final    NO GROWTH 5 DAYS Performed at [facility], [address] [place], [place] [address]    Report Status 11/30/2023 FINAL  Final  Blood Culture (routine x 2)     Status: None   Collection Time: 11/25/23  4:46 AM   Specimen: BLOOD LEFT FOREARM  Result Value Ref Range Status   Specimen Description   Final    BLOOD LEFT FOREARM Performed at [facility], [address], [place], [place] [address]    Special Requests   Final    BOTTLES DRAWN AEROBIC AND ANAEROBIC Blood Culture adequate volume Performed at [facility], [address] [place], [place] [address]    Culture   Final    NO GROWTH 5 DAYS Performed at [facility], [address], [place], [place] [address]     Report Status 11/30/2023 FINAL  Final  Resp panel by RT-PCR (RSV, Flu A&B, Covid) Anterior Nasal Swab     Status: None   Collection Time: 11/25/23  5:04 AM   Specimen: Anterior Nasal Swab  Result Value Ref Range Status   SARS Coronavirus 2 by RT PCR NEGATIVE NEGATIVE Final    Comment: (NOTE) SARS-CoV-2 target nucleic acids are NOT DETECTED.  The SARS-CoV-2 RNA is generally detectable in upper respiratory specimens during the acute phase of infection. The lowest concentration of SARS-CoV-2 viral copies this assay can detect is 138 copies/mL. A negative result does not preclude SARS-Cov-2 infection and should not be used as the sole basis for treatment or other patient management decisions. A negative result may occur with  improper specimen collection/handling, submission of specimen other than nasopharyngeal swab, presence of viral mutation(s) within the areas targeted by this assay, and inadequate number of viral copies(<138 copies/mL). A  negative result must be combined with clinical observations, patient history, and epidemiological information. The expected result is Negative.  Fact Sheet for Patients:  BloggerCourse.com  Fact Sheet for Healthcare Providers:  SeriousBroker.it  This test is no t yet approved or cleared by the [place] FDA and  has been authorized for detection and/or diagnosis of SARS-CoV-2 by FDA under an Emergency Use Authorization (EUA). This EUA will remain  in effect (meaning this test can be used) for the duration of the COVID-19 declaration under Section 564(b)(1) of the Act, 21 U.S.C.section 360bbb-3(b)(1), unless the authorization is terminated  or revoked sooner.       Influenza A by PCR NEGATIVE NEGATIVE Final   Influenza B by PCR NEGATIVE NEGATIVE Final    Comment: (NOTE) The Xpert Xpress SARS-CoV-2/FLU/RSV plus assay is intended as an aid in the diagnosis of influenza from Nasopharyngeal  swab specimens and should not be used as a sole basis for treatment. Nasal washings and aspirates are unacceptable for Xpert Xpress SARS-CoV-2/FLU/RSV testing.  Fact Sheet for Patients: BloggerCourse.com  Fact Sheet for Healthcare Providers: SeriousBroker.it  This test is not yet approved or cleared by the [place] FDA and has been authorized for detection and/or diagnosis of SARS-CoV-2 by FDA under an Emergency Use Authorization (EUA). This EUA will remain in effect (meaning this test can be used) for the duration of the COVID-19 declaration under Section 564(b)(1) of the Act, 21 U.S.C. section 360bbb-3(b)(1), unless the authorization is terminated or revoked.     Resp Syncytial Virus by PCR NEGATIVE NEGATIVE Final    Comment: (NOTE) Fact Sheet for Patients: BloggerCourse.com  Fact Sheet for Healthcare Providers: SeriousBroker.it  This test is not yet approved or cleared by the [place] FDA and has been authorized for detection and/or diagnosis of SARS-CoV-2 by FDA under an Emergency Use Authorization (EUA). This EUA will remain in effect (meaning this test can be used) for the duration of the COVID-19 declaration under Section 564(b)(1) of the Act, 21 U.S.C. section 360bbb-3(b)(1), unless the authorization is terminated or revoked.  Performed at [facility], [address], [place], [place] [address]   Urine Culture     Status: Abnormal   Collection Time: 11/25/23  5:17 AM   Specimen: Urine, Random  Result Value Ref Range Status   Specimen Description   Final    URINE, RANDOM Performed at [facility], [address] [place], [place] [address]    Special Requests   Final    NONE Reflexed from [id number] Performed at [facility], [address] [place], [place] [address]    Culture 80,000 COLONIES/mL  ENTEROCOCCUS FAECALIS (A)  Final   Report Status 11/27/2023 FINAL  Final   Organism ID, Bacteria ENTEROCOCCUS FAECALIS (A)  Final      Susceptibility   Enterococcus faecalis - MIC*    AMPICILLIN <=2 SENSITIVE Sensitive     NITROFURANTOIN <=16 SENSITIVE Sensitive     VANCOMYCIN 1 SENSITIVE Sensitive     * 80,000 COLONIES/mL ENTEROCOCCUS FAECALIS     Labs:  Basic Metabolic Panel: Recent Labs  Lab 11/25/23 0440 11/25/23 1029 11/26/23 0437 11/27/23 0433  NA 134* 135 135 133*  K 3.7 3.6 3.8 3.8  CL 99 102 102 98  CO2 26 24 25 26   GLUCOSE 150* 125* 67* 137*  BUN 31* 29* 23 21  CREATININE 1.09 1.01 0.85 0.97  CALCIUM 9.2 8.7* 9.2 9.7   Liver Function Tests: Recent Labs  Lab 11/25/23 1029  AST 12*  ALT 8  ALKPHOS 38  BILITOT 0.9  PROT 5.3*  ALBUMIN 2.6*   CBC: Recent Labs  Lab 11/25/23 0440 11/26/23 0437 11/27/23 0433  WBC 8.1 8.6 8.5  NEUTROABS 6.6  --   --   HGB 10.0* 9.9* 11.2*  HCT 30.7* 31.3* 34.1*  MCV 98.4 103.6* 99.4  PLT 237 216 266   Urinalysis    Component Value Date/Time   COLORURINE YELLOW 11/25/2023 0517   APPEARANCEUR CLOUDY (A) 11/25/2023 0517   LABSPEC 1.018 11/25/2023 0517   PHURINE 5.0 11/25/2023 0517   GLUCOSEU 50 (A) 11/25/2023 0517   HGBUR NEGATIVE 11/25/2023 0517   BILIRUBINUR NEGATIVE 11/25/2023 0517   KETONESUR NEGATIVE 11/25/2023 0517   PROTEINUR NEGATIVE 11/25/2023 0517   NITRITE NEGATIVE 11/25/2023 0517   LEUKOCYTESUR LARGE (A) 11/25/2023 0517   Sepsis Labs Recent Labs  Lab 11/25/23 0440 11/26/23 0437 11/27/23 0433  WBC 8.1 8.6 8.5  Time coordinating discharge: Over 30 minutes  SIGNED:   Leeroy Bock, DO [facility] 11/30/2023, 2:56 PM Pager   If 7PM-7AM, please contact night-coverage [email or web address]

## 2023-11-30 DIAGNOSIS — J189 Pneumonia, unspecified organism: Secondary | ICD-10-CM | POA: Diagnosis not present

## 2023-11-30 LAB — GLUCOSE, CAPILLARY
Glucose-Capillary: 109 mg/dL — ABNORMAL HIGH (ref 70–99)
Glucose-Capillary: 195 mg/dL — ABNORMAL HIGH (ref 70–99)
Glucose-Capillary: 148 mg/dL — ABNORMAL HIGH (ref 70–99)
Glucose-Capillary: 125 mg/dL — ABNORMAL HIGH (ref 70–99)

## 2023-11-30 LAB — CULTURE, BLOOD (ROUTINE X 2)
Culture: NO GROWTH
Culture: NO GROWTH
Special Requests: ADEQUATE
Special Requests: ADEQUATE

## 2023-11-30 NOTE — Progress Notes (Signed)
 Physical Therapy Treatment Patient Details Name: [name] [name] MRN: [id number] DOB: Sep 30, 1928 Today's Date: 11/30/2023   History of Present Illness [name] [name] is a 89 y.o. male being admitted to the hospital with bilateral community-acquired pneumonia and associated acute hypoxic respiratory failure. PMH: diabetes, dementia, CVA, essential tremor, stroke    PT Comments  PT - Cognition Comments: AxO x 2 pleasant.  Speech difficult to understand at times.  Following all commands.  Pt indicated he needed to use "the bathroom".  Confused to time/day/location. Pt resides at [facility] and was able to assist staff with transfers.  Pt was not amb. Assisted OOB was difficult.General bed mobility comments: pt required Max Assist to transfer from supine to EOB using bed pad to complete scooting to EOB.  Poor sitting balance due to fatigue. General transfer comment: pt required + 2 Max Asisst to transfer from elevated bed to [facility] then from [facility] to recliner.  VERY weak.  Limited stance time.  Had to switch [facility] out with recliner on last stand from behind pt.  Pt was unable to support his weight. Positioned in recliner to comfort with multiple pillows.   Per chart review, Family requesting pt return to his ALF.    If plan is discharge home, recommend the following: A lot of help with walking and/or transfers;A lot of help with bathing/dressing/bathroom;Assistance with cooking/housework;Assist for transportation   Can travel by private vehicle        Equipment Recommendations  None recommended by PT    Recommendations for Other Services       Precautions / Restrictions Precautions Precautions: Fall Restrictions Weight Bearing Restrictions Per Provider Order: No     Mobility  Bed Mobility Overal bed mobility: Needs Assistance Bed Mobility: Supine to Sit     Supine to sit: Max assist     General bed mobility comments: pt required Max Assist to transfer from supine to EOB  using bed pad to complete scooting to EOB.  Poor sitting balance due to fatigue.    Transfers Overall transfer level: Needs assistance Equipment used: 2 person hand held assist, Rolling walker (2 wheels) Transfers: Bed to chair/wheelchair/BSC   Stand pivot transfers: Max assist, +2 physical assistance, +2 safety/equipment         General transfer comment: pt required + 2 Max Asisst to transfer from elevated bed to [facility] then from [facility] to recliner.  VERY weak.  Limited stance time.  Had to switch [facility] out with recliner on last stand from behind pt.  Pt was unable to support his weight.    Ambulation/Gait               General Gait Details: transfer only   Optometrist     Tilt Bed    Modified Rankin (Stroke Patients Only)       Balance                                            Communication    Cognition Arousal: Alert Behavior During Therapy: WFL for tasks assessed/performed   PT - Cognitive impairments: History of cognitive impairments                       PT - Cognition Comments: AxO x 2 pleasant.  Speech difficult to understand at times.  Following all commands.  Pt indicated he needed to use "the bathroom".  Confused to time/day/location.        Cueing    Exercises      General Comments        Pertinent Vitals/Pain Pain Assessment Pain Assessment: No/denies pain    Home Living                          Prior Function            PT Goals (current goals can now be found in the care plan section) Progress towards PT goals: Progressing toward goals    Frequency    Min 2X/week      PT Plan      Co-evaluation              AM-PAC PT "6 Clicks" Mobility   Outcome Measure  Help needed turning from your back to your side while in a flat bed without using bedrails?: A Lot Help needed moving from lying on your back to sitting on the side of a flat bed without  using bedrails?: A Lot Help needed moving to and from a bed to a chair (including a wheelchair)?: A Lot Help needed standing up from a chair using your arms (e.g., wheelchair or bedside chair)?: A Lot Help needed to walk in hospital room?: Total Help needed climbing 3-5 steps with a railing? : Total 6 Click Score: 10    End of Session Equipment Utilized During Treatment: Gait belt Activity Tolerance: Patient limited by fatigue Patient left: in chair;with call bell/phone within reach Nurse Communication: Mobility status PT Visit Diagnosis: Other abnormalities of gait and mobility (R26.89);Muscle weakness (generalized) (M62.81)     Time: 4098-1191 PT Time Calculation (min) (ACUTE ONLY): 18 min  Charges:    $Therapeutic Activity: 8-22 mins PT General Charges $$ ACUTE PT VISIT: 1 Visit                     Felecia Shelling  PTA Acute  Rehabilitation Services Office M-F          [phone]

## 2023-11-30 NOTE — NC FL2 (Signed)
 [place] MEDICAID FL2 LEVEL OF CARE FORM     IDENTIFICATION  Patient Name: [name] [name] Birthdate: 1929/07/29 Sex: male Admission Date (Current Location): 11/25/2023  [facility] and [place] Number:  Producer, television/film/video and Address:  [facility],  [address] [place], [place] [address]      Provider Number: [id number]  Attending Physician Name and Address:  Leeroy Bock, MD  Relative Name and Phone Number:  Kendryck Lacroix [phone]    Current Level of Care: Hospital Recommended Level of Care: Assisted Living Facility Prior Approval Number:    Date Approved/Denied:   PASRR Number:    Discharge Plan: Other (Comment) (ALF-[facility])    Current Diagnoses: Patient Active Problem List   Diagnosis Date Noted   Dyspnea on exertion 09/04/2022   AMS (altered mental status) 08/15/2022   Anemia in chronic kidney disease 11/28/2020   Chronic kidney disease due to hypertension 11/28/2020   Chronic sinusitis 11/28/2020   Chronic venous insufficiency 11/28/2020   Essential hypertension 11/28/2020   Gastroesophageal reflux disease 11/28/2020   Hypertrophy of prostate without urinary obstruction and other lower urinary tract symptoms (LUTS) 11/28/2020   Hypoglycemia due to type 2 diabetes mellitus (HCC) 11/28/2020   Insomnia 11/28/2020   Ischemic optic neuropathy 11/28/2020   Late effects of cerebrovascular disease 11/28/2020   Sequela, post-stroke 11/28/2020   Long term (current) use of insulin (HCC) 11/28/2020   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   Other specified cardiac dysrhythmias 11/28/2020   Proteinuria 11/28/2020   Other psoriasis 11/28/2020   Psoriasis 11/28/2020   Vision loss, central, left 10/13/2018   Tremor, essential 10/13/2018   Late onset Alzheimer's disease without behavioral disturbance (HCC) 10/13/2018   Osteoarthritis of knee 10/01/2017   Stroke (cerebrum) (HCC)    Acute CVA (cerebrovascular accident)  (HCC) 04/05/2017   CAP (community acquired pneumonia) 04/04/2017   Acute metabolic encephalopathy 04/04/2017   Dementia (HCC) 04/04/2017   Diabetes mellitus (HCC) 04/04/2017   Hyperlipidemia 04/04/2017   Change in bowel habits 11/06/2016   Umbilical hernia without obstruction and without gangrene 11/06/2016   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   Essential tremor 08/20/2015   MCI (mild cognitive impairment) with memory loss 08/20/2015   Memory loss 02/13/2015   OBSTRUCTIVE SLEEP APNEA 07/19/2008    Orientation RESPIRATION BLADDER Height & Weight     Self, Place  Normal Incontinent Weight: 74.8 kg Height:  5\' 9"  (175.3 cm)  BEHAVIORAL SYMPTOMS/MOOD NEUROLOGICAL BOWEL NUTRITION STATUS      Incontinent Diet (Regular)  AMBULATORY STATUS COMMUNICATION OF NEEDS Skin   Limited Assist Verbally Normal                       Personal Care Assistance Level of Assistance  Bathing, Feeding, Dressing Bathing Assistance: Limited assistance Feeding assistance: Independent Dressing Assistance: Independent     Functional Limitations Info  Sight, Hearing, Speech Sight Info: Impaired (eyeglasses) Hearing Info: Adequate Speech Info: Impaired (dentures-top/bottom)    SPECIAL CARE FACTORS FREQUENCY                       Contractures Contractures Info: Not present    Additional Factors Info  Code Status, Allergies Code Status Info: DNR Allergies Info: Donepezil, Lisinopril, Methocarbamol, Namenda (Memantine Hcl), Other           Current Medications (11/30/2023):  This is the current hospital active medication list Current Facility-Administered Medications  Medication Dose Route Frequency Provider Last Rate  Last Admin   acetaminophen (TYLENOL) tablet 650 mg  650 mg Oral Q6H PRN Kirby Crigler, Mir M, MD       Or   acetaminophen (TYLENOL) suppository 650 mg  650 mg Rectal Q6H PRN Kirby Crigler, Mir M, MD       albuterol (PROVENTIL) (2.5 MG/3ML) 0.083% nebulizer solution 2.5 mg  2.5 mg  Nebulization Q2H PRN Kirby Crigler, Mir M, MD       aspirin EC tablet 325 mg  325 mg Oral Daily Kirby Crigler, Mir M, MD   325 mg at 11/30/23 1610   cholecalciferol (VITAMIN D3) 25 MCG (1000 UNIT) tablet 1,000 Units  1,000 Units Oral Daily Marolyn Haller, MD   1,000 Units at 11/30/23 0909   citalopram (CELEXA) tablet 20 mg  20 mg Oral Daily Kirby Crigler, Mir M, MD   20 mg at 11/30/23 0909   enoxaparin (LOVENOX) injection 40 mg  40 mg Subcutaneous Q24H Kirby Crigler, Mir M, MD   40 mg at 11/30/23 0909   feeding supplement (GLUCERNA SHAKE) (GLUCERNA SHAKE) liquid 237 mL  237 mL Oral TID BM Marolyn Haller, MD   237 mL at 11/30/23 1346   ondansetron (ZOFRAN) tablet 4 mg  4 mg Oral Q6H PRN Kirby Crigler, Mir M, MD       Or   ondansetron [facility]) injection 4 mg  4 mg Intravenous Q6H PRN Kirby Crigler, Mir M, MD       simvastatin (ZOCOR) tablet 40 mg  40 mg Oral Daily Kirby Crigler, Mir M, MD   40 mg at 11/30/23 9604   tamsulosin (FLOMAX) capsule 0.4 mg  0.4 mg Oral Daily Marolyn Haller, MD   0.4 mg at 11/30/23 5409     Discharge Medications: Please see discharge summary for a list of discharge medications.  Relevant Imaging Results:  Relevant Lab Results:   Additional Information ss#[id number]  Kelani Robart, Olegario Messier, Charity fundraiser

## 2023-11-30 NOTE — Progress Notes (Signed)
 Report called to [place] at [facility].

## 2023-11-30 NOTE — [place] FL2 (Signed)
  MEDICAID FL2 LEVEL OF CARE FORM     IDENTIFICATION  Patient Name: [name] [name] Birthdate: 02/07/1929 Sex: male Admission Date (Current Location): 11/25/2023  [facility] and [place] Number:  Producer, television/film/video and Address:  [facility],  [address] [place], [place] [address]      Provider Number: [id number]  Attending Physician Name and Address:  Leeroy Bock, MD  Relative Name and Phone Number:  Coltyn Hanning [phone]    Current Level of Care: Hospital Recommended Level of Care: Assisted Living Facility Prior Approval Number:    Date Approved/Denied:   PASRR Number:    Discharge Plan: Other (Comment) (ALF-[facility])    Current Diagnoses: Patient Active Problem List   Diagnosis Date Noted   Dyspnea on exertion 09/04/2022   AMS (altered mental status) 08/15/2022   Anemia in chronic kidney disease 11/28/2020   Chronic kidney disease due to hypertension 11/28/2020   Chronic sinusitis 11/28/2020   Chronic venous insufficiency 11/28/2020   Essential hypertension 11/28/2020   Gastroesophageal reflux disease 11/28/2020   Hypertrophy of prostate without urinary obstruction and other lower urinary tract symptoms (LUTS) 11/28/2020   Hypoglycemia due to type 2 diabetes mellitus (HCC) 11/28/2020   Insomnia 11/28/2020   Ischemic optic neuropathy 11/28/2020   Late effects of cerebrovascular disease 11/28/2020   Sequela, post-stroke 11/28/2020   Long term (current) use of insulin (HCC) 11/28/2020   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   Other specified cardiac dysrhythmias 11/28/2020   Proteinuria 11/28/2020   Other psoriasis 11/28/2020   Psoriasis 11/28/2020   Vision loss, central, left 10/13/2018   Tremor, essential 10/13/2018   Late onset Alzheimer's disease without behavioral disturbance (HCC) 10/13/2018   Osteoarthritis of knee 10/01/2017   Stroke (cerebrum) (HCC)    Acute CVA (cerebrovascular accident)  (HCC) 04/05/2017   CAP (community acquired pneumonia) 04/04/2017   Acute metabolic encephalopathy 04/04/2017   Dementia (HCC) 04/04/2017   Diabetes mellitus (HCC) 04/04/2017   Hyperlipidemia 04/04/2017   Change in bowel habits 11/06/2016   Umbilical hernia without obstruction and without gangrene 11/06/2016   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   Essential tremor 08/20/2015   MCI (mild cognitive impairment) with memory loss 08/20/2015   Memory loss 02/13/2015   OBSTRUCTIVE SLEEP APNEA 07/19/2008    Orientation RESPIRATION BLADDER Height & Weight     Self, Place  Normal Incontinent Weight: 74.8 kg Height:  5\' 9"  (175.3 cm)  BEHAVIORAL SYMPTOMS/MOOD NEUROLOGICAL BOWEL NUTRITION STATUS      Incontinent Diet (Dysphagia 3)  AMBULATORY STATUS COMMUNICATION OF NEEDS Skin   Extensive Assist Verbally Normal                       Personal Care Assistance Level of Assistance  Bathing, Feeding, Dressing Bathing Assistance: Maximum assistance Feeding assistance: Maximum assistance Dressing Assistance: Maximum assistance     Functional Limitations Info  Sight, Hearing, Speech Sight Info: Impaired (eyeglasses) Hearing Info: Adequate Speech Info: Impaired (dentures-top/bottom)    SPECIAL CARE FACTORS FREQUENCY                       Contractures Contractures Info: Not present    Additional Factors Info  Code Status, Allergies Code Status Info: DNR Allergies Info: Donepezil, Lisinopril, Methocarbamol, Namenda (Memantine Hcl), Other           Current Medications (11/30/2023):  This is the current hospital active medication list Current Facility-Administered Medications  Medication Dose Route Frequency  Provider Last Rate Last Admin   acetaminophen (TYLENOL) tablet 650 mg  650 mg Oral Q6H PRN Kirby Crigler, Mir M, MD       Or   acetaminophen (TYLENOL) suppository 650 mg  650 mg Rectal Q6H PRN Kirby Crigler, Mir M, MD       albuterol (PROVENTIL) (2.5 MG/3ML) 0.083% nebulizer  solution 2.5 mg  2.5 mg Nebulization Q2H PRN Kirby Crigler, Mir M, MD       aspirin EC tablet 325 mg  325 mg Oral Daily Kirby Crigler, Mir M, MD   325 mg at 11/30/23 2956   cholecalciferol (VITAMIN D3) 25 MCG (1000 UNIT) tablet 1,000 Units  1,000 Units Oral Daily Marolyn Haller, MD   1,000 Units at 11/30/23 0909   citalopram (CELEXA) tablet 20 mg  20 mg Oral Daily Kirby Crigler, Mir M, MD   20 mg at 11/30/23 0909   enoxaparin (LOVENOX) injection 40 mg  40 mg Subcutaneous Q24H Kirby Crigler, Mir M, MD   40 mg at 11/30/23 0909   feeding supplement (GLUCERNA SHAKE) (GLUCERNA SHAKE) liquid 237 mL  237 mL Oral TID BM Marolyn Haller, MD   237 mL at 11/30/23 1346   ondansetron (ZOFRAN) tablet 4 mg  4 mg Oral Q6H PRN Kirby Crigler, Mir M, MD       Or   ondansetron [facility]) injection 4 mg  4 mg Intravenous Q6H PRN Kirby Crigler, Mir M, MD       simvastatin (ZOCOR) tablet 40 mg  40 mg Oral Daily Kirby Crigler, Mir M, MD   40 mg at 11/30/23 2130   tamsulosin (FLOMAX) capsule 0.4 mg  0.4 mg Oral Daily Marolyn Haller, MD   0.4 mg at 11/30/23 8657     Discharge Medications: Please see discharge summary for a list of discharge medications.  Relevant Imaging Results:  Relevant Lab Results:   Additional Information ss#[id number]  Idrissa Beville, Olegario Messier, Charity fundraiser

## 2023-11-30 NOTE — Plan of Care (Signed)

## 2023-11-30 NOTE — [place] FL2 (Signed)
 [place] MEDICAID FL2 LEVEL OF CARE FORM     IDENTIFICATION  Patient Name: [name] [name] Birthdate: 1929/01/11 Sex: male Admission Date (Current Location): 11/25/2023  [place] and [place] Number:  Producer, television/film/video and Address:  [facility],  [address] [place], [place] [address]      Provider Number: [id number]  Attending Physician Name and Address:  Leeroy Bock, MD  Relative Name and Phone Number:  Juwaun Inskeep [phone]    Current Level of Care: Hospital Recommended Level of Care: Assisted Living Facility Prior Approval Number:    Date Approved/Denied:   PASRR Number:    Discharge Plan: Other (Comment) (ALF-[facility])    Current Diagnoses: Patient Active Problem List   Diagnosis Date Noted   Dyspnea on exertion 09/04/2022   AMS (altered mental status) 08/15/2022   Anemia in chronic kidney disease 11/28/2020   Chronic kidney disease due to hypertension 11/28/2020   Chronic sinusitis 11/28/2020   Chronic venous insufficiency 11/28/2020   Essential hypertension 11/28/2020   Gastroesophageal reflux disease 11/28/2020   Hypertrophy of prostate without urinary obstruction and other lower urinary tract symptoms (LUTS) 11/28/2020   Hypoglycemia due to type 2 diabetes mellitus (HCC) 11/28/2020   Insomnia 11/28/2020   Ischemic optic neuropathy 11/28/2020   Late effects of cerebrovascular disease 11/28/2020   Sequela, post-stroke 11/28/2020   Long term (current) use of insulin (HCC) 11/28/2020   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   Other specified cardiac dysrhythmias 11/28/2020   Proteinuria 11/28/2020   Other psoriasis 11/28/2020   Psoriasis 11/28/2020   Vision loss, central, left 10/13/2018   Tremor, essential 10/13/2018   Late onset Alzheimer's disease without behavioral disturbance (HCC) 10/13/2018   Osteoarthritis of knee 10/01/2017   Stroke (cerebrum) (HCC)    Acute CVA (cerebrovascular accident)  (HCC) 04/05/2017   CAP (community acquired pneumonia) 04/04/2017   Acute metabolic encephalopathy 04/04/2017   Dementia (HCC) 04/04/2017   Diabetes mellitus (HCC) 04/04/2017   Hyperlipidemia 04/04/2017   Change in bowel habits 11/06/2016   Umbilical hernia without obstruction and without gangrene 11/06/2016   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   Essential tremor 08/20/2015   MCI (mild cognitive impairment) with memory loss 08/20/2015   Memory loss 02/13/2015   OBSTRUCTIVE SLEEP APNEA 07/19/2008    Orientation RESPIRATION BLADDER Height & Weight     Self, Place  Normal Incontinent Weight: 74.8 kg Height:  5\' 9"  (175.3 cm)  BEHAVIORAL SYMPTOMS/MOOD NEUROLOGICAL BOWEL NUTRITION STATUS      Incontinent Diet (Regular)  AMBULATORY STATUS COMMUNICATION OF NEEDS Skin   Extensive Assist Verbally Normal                       Personal Care Assistance Level of Assistance  Bathing, Feeding, Dressing Bathing Assistance: Maximum assistance Feeding assistance: Maximum assistance Dressing Assistance: Maximum assistance     Functional Limitations Info  Sight, Hearing, Speech Sight Info: Impaired (eyeglasses) Hearing Info: Adequate Speech Info: Impaired (dentures-top/bottom)    SPECIAL CARE FACTORS FREQUENCY                       Contractures Contractures Info: Not present    Additional Factors Info  Code Status, Allergies Code Status Info: DNR Allergies Info: Donepezil, Lisinopril, Methocarbamol, Namenda (Memantine Hcl), Other           Current Medications (11/30/2023):  This is the current hospital active medication list Current Facility-Administered Medications  Medication Dose Route Frequency Provider  Last Rate Last Admin   acetaminophen (TYLENOL) tablet 650 mg  650 mg Oral Q6H PRN Kirby Crigler, Mir M, MD       Or   acetaminophen (TYLENOL) suppository 650 mg  650 mg Rectal Q6H PRN Kirby Crigler, Mir M, MD       albuterol (PROVENTIL) (2.5 MG/3ML) 0.083% nebulizer solution  2.5 mg  2.5 mg Nebulization Q2H PRN Kirby Crigler, Mir M, MD       aspirin EC tablet 325 mg  325 mg Oral Daily Kirby Crigler, Mir M, MD   325 mg at 11/30/23 1610   cholecalciferol (VITAMIN D3) 25 MCG (1000 UNIT) tablet 1,000 Units  1,000 Units Oral Daily Marolyn Haller, MD   1,000 Units at 11/30/23 0909   citalopram (CELEXA) tablet 20 mg  20 mg Oral Daily Kirby Crigler, Mir M, MD   20 mg at 11/30/23 0909   enoxaparin (LOVENOX) injection 40 mg  40 mg Subcutaneous Q24H Kirby Crigler, Mir M, MD   40 mg at 11/30/23 0909   feeding supplement (GLUCERNA SHAKE) (GLUCERNA SHAKE) liquid 237 mL  237 mL Oral TID BM Marolyn Haller, MD   237 mL at 11/30/23 1346   ondansetron (ZOFRAN) tablet 4 mg  4 mg Oral Q6H PRN Kirby Crigler, Mir M, MD       Or   ondansetron [facility]) injection 4 mg  4 mg Intravenous Q6H PRN Kirby Crigler, Mir M, MD       simvastatin (ZOCOR) tablet 40 mg  40 mg Oral Daily Kirby Crigler, Mir M, MD   40 mg at 11/30/23 9604   tamsulosin (FLOMAX) capsule 0.4 mg  0.4 mg Oral Daily Marolyn Haller, MD   0.4 mg at 11/30/23 5409     Discharge Medications: Please see discharge summary for a list of discharge medications.  Relevant Imaging Results:  Relevant Lab Results:   Additional Information ss#[phone]  Fradel Baldonado, Olegario Messier, Charity fundraiser

## 2023-11-30 NOTE — TOC Transition Note (Addendum)
 Transition of Care [facility]) - Discharge Note   Patient Details  Name: [name] [name] MRN: [id number] Date of Birth: 04-20-1929  [facility] CM/SW Contact:  Lanier Clam, RN Phone Number: 11/30/2023, 3:38 PM   Clinical Narrative:d/c back to [facility] ALF rep Cordelia Pen accepted back;fl2,d/c summary sent. Going to [facility]-ALF-rm#[id number], report#[phone]. PTAR called. Adella Hare w/HHC services.No further CM needs.       Final next level of care: Assisted Living Barriers to Discharge: No Barriers Identified   Patient Goals and CMS Choice Patient states their goals for this hospitalization and ongoing recovery are:: back to ALF @ [facility].gov Compare Post Acute Care list provided to:: Patient Represenative (must comment) Choice offered to / list presented to : Adult Children [place] ownership interest in [facility].provided to:: Adult Children    Discharge Placement                Patient to be transferred to facility by: PTAR Name of family member notified: Deborah(dtr) Patient and family notified of of transfer: 11/30/23  Discharge Plan and Services Additional resources added to the After Visit Summary for     Discharge Planning Services: CM Consult                                 Social Drivers of Health (SDOH) Interventions SDOH Screenings   Food Insecurity: No Food Insecurity (11/27/2023)  Housing: Low Risk  (11/27/2023)  Transportation Needs: No Transportation Needs (11/27/2023)  Utilities: Not At Risk (11/27/2023)  Social Connections: Moderately Integrated (11/25/2023)  Tobacco Use: Low Risk  (11/25/2023)     Readmission Risk Interventions     No data to display

## 2023-11-30 NOTE — [place] FL2 (Signed)
 [place] MEDICAID FL2 LEVEL OF CARE FORM     IDENTIFICATION  Patient Name: [name] [name] Birthdate: 02-12-1929 Sex: male Admission Date (Current Location): 11/25/2023  [facility] and [place] Number:  Producer, television/film/video and Address:  [facility],  [address] [place], [place] [address]      Provider Number: [id number]  Attending Physician Name and Address:  Leeroy Bock, MD  Relative Name and Phone Number:  Thayer Embleton [phone]    Current Level of Care: Hospital Recommended Level of Care: Assisted Living Facility Prior Approval Number:    Date Approved/Denied:   PASRR Number:    Discharge Plan: Other (Comment) (ALF-[facility])    Current Diagnoses: Patient Active Problem List   Diagnosis Date Noted   Dyspnea on exertion 09/04/2022   AMS (altered mental status) 08/15/2022   Anemia in chronic kidney disease 11/28/2020   Chronic kidney disease due to hypertension 11/28/2020   Chronic sinusitis 11/28/2020   Chronic venous insufficiency 11/28/2020   Essential hypertension 11/28/2020   Gastroesophageal reflux disease 11/28/2020   Hypertrophy of prostate without urinary obstruction and other lower urinary tract symptoms (LUTS) 11/28/2020   Hypoglycemia due to type 2 diabetes mellitus (HCC) 11/28/2020   Insomnia 11/28/2020   Ischemic optic neuropathy 11/28/2020   Late effects of cerebrovascular disease 11/28/2020   Sequela, post-stroke 11/28/2020   Long term (current) use of insulin (HCC) 11/28/2020   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   Other specified cardiac dysrhythmias 11/28/2020   Proteinuria 11/28/2020   Other psoriasis 11/28/2020   Psoriasis 11/28/2020   Vision loss, central, left 10/13/2018   Tremor, essential 10/13/2018   Late onset Alzheimer's disease without behavioral disturbance (HCC) 10/13/2018   Osteoarthritis of knee 10/01/2017   Stroke (cerebrum) (HCC)    Acute CVA (cerebrovascular accident)  (HCC) 04/05/2017   CAP (community acquired pneumonia) 04/04/2017   Acute metabolic encephalopathy 04/04/2017   Dementia (HCC) 04/04/2017   Diabetes mellitus (HCC) 04/04/2017   Hyperlipidemia 04/04/2017   Change in bowel habits 11/06/2016   Umbilical hernia without obstruction and without gangrene 11/06/2016   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   Essential tremor 08/20/2015   MCI (mild cognitive impairment) with memory loss 08/20/2015   Memory loss 02/13/2015   OBSTRUCTIVE SLEEP APNEA 07/19/2008    Orientation RESPIRATION BLADDER Height & Weight     Self, Place  Normal Incontinent Weight: 74.8 kg Height:  5\' 9"  (175.3 cm)  BEHAVIORAL SYMPTOMS/MOOD NEUROLOGICAL BOWEL NUTRITION STATUS      Incontinent Diet (mehcanical soft ground meat)  AMBULATORY STATUS COMMUNICATION OF NEEDS Skin   Extensive Assist Verbally Normal                       Personal Care Assistance Level of Assistance  Bathing, Feeding, Dressing Bathing Assistance: Maximum assistance Feeding assistance: Maximum assistance Dressing Assistance: Maximum assistance     Functional Limitations Info  Sight, Hearing, Speech Sight Info: Impaired (eyeglasses) Hearing Info: Adequate Speech Info: Impaired (dentures-top/bottom)    SPECIAL CARE FACTORS FREQUENCY                       Contractures Contractures Info: Not present    Additional Factors Info  Code Status, Allergies Code Status Info: DNR Allergies Info: Donepezil, Lisinopril, Methocarbamol, Namenda (Memantine Hcl), Other           Current Medications (11/30/2023):  This is the current hospital active medication list Current Facility-Administered Medications  Medication Dose  Route Frequency Provider Last Rate Last Admin   acetaminophen (TYLENOL) tablet 650 mg  650 mg Oral Q6H PRN Kirby Crigler, Mir M, MD       Or   acetaminophen (TYLENOL) suppository 650 mg  650 mg Rectal Q6H PRN Kirby Crigler, Mir M, MD       albuterol (PROVENTIL) (2.5 MG/3ML) 0.083%  nebulizer solution 2.5 mg  2.5 mg Nebulization Q2H PRN Kirby Crigler, Mir M, MD       aspirin EC tablet 325 mg  325 mg Oral Daily Kirby Crigler, Mir M, MD   325 mg at 11/30/23 0454   cholecalciferol (VITAMIN D3) 25 MCG (1000 UNIT) tablet 1,000 Units  1,000 Units Oral Daily Marolyn Haller, MD   1,000 Units at 11/30/23 0909   citalopram (CELEXA) tablet 20 mg  20 mg Oral Daily Kirby Crigler, Mir M, MD   20 mg at 11/30/23 0909   enoxaparin (LOVENOX) injection 40 mg  40 mg Subcutaneous Q24H Kirby Crigler, Mir M, MD   40 mg at 11/30/23 0909   feeding supplement (GLUCERNA SHAKE) (GLUCERNA SHAKE) liquid 237 mL  237 mL Oral TID BM Marolyn Haller, MD   237 mL at 11/30/23 1346   ondansetron (ZOFRAN) tablet 4 mg  4 mg Oral Q6H PRN Kirby Crigler, Mir M, MD       Or   ondansetron [facility]) injection 4 mg  4 mg Intravenous Q6H PRN Kirby Crigler, Mir M, MD       simvastatin (ZOCOR) tablet 40 mg  40 mg Oral Daily Kirby Crigler, Mir M, MD   40 mg at 11/30/23 0981   tamsulosin (FLOMAX) capsule 0.4 mg  0.4 mg Oral Daily Marolyn Haller, MD   0.4 mg at 11/30/23 1914     Discharge Medications: Please see discharge summary for a list of discharge medications.  Relevant Imaging Results:  Relevant Lab Results:   Additional Information ss#[phone]  Mikylah Ackroyd, Olegario Messier, Charity fundraiser

## 2023-12-01 LAB — GLUCOSE, CAPILLARY: Glucose-Capillary: 119 mg/dL — ABNORMAL HIGH (ref 70–99)

## 2023-12-01 NOTE — Plan of Care (Signed)

## 2023-12-01 NOTE — Care Management Important Message (Signed)
 Important Message  Patient Details IM Letter given. Name: [name] [name] MRN: [id number] Date of Birth: 12-15-1928   Important Message Given:  Yes - Medicare IM     Caren Macadam 12/01/2023, 8:18 AM

## 2023-12-01 NOTE — Plan of Care (Signed)
  Problem: Education: Goal: Ability to describe self-care measures that may prevent or decrease complications (Diabetes Survival Skills Education) will improve Outcome: Progressing Goal: Individualized Educational Video(s) Outcome: Progressing   Problem: Coping: Goal: Ability to adjust to condition or change in health will improve Outcome: Progressing   Problem: Fluid Volume: Goal: Ability to maintain a balanced intake and output will improve Outcome: Progressing   Problem: Health Behavior/Discharge Planning: Goal: Ability to identify and utilize available resources and services will improve Outcome: Progressing Goal: Ability to manage health-related needs will improve Outcome: Progressing   Problem: Metabolic: Goal: Ability to maintain appropriate glucose levels will improve Outcome: Progressing   Problem: Nutritional: Goal: Maintenance of adequate nutrition will improve Outcome: Progressing Goal: Progress toward achieving an optimal weight will improve Outcome: Progressing   Problem: Tissue Perfusion: Goal: Adequacy of tissue perfusion will improve Outcome: Progressing   Problem: Education: Goal: Knowledge of General Education information will improve Description: Including pain rating scale, medication(s)/side effects and non-pharmacologic comfort measures Outcome: Progressing

## 2023-12-01 NOTE — TOC Transition Note (Signed)
 Transition of Care [facility]) - Discharge Note   Patient Details  Name: [name] [name] MRN: [id number] Date of Birth: [birth year]  [facility] CM/SW Contact:  Lanier Clam, RN Phone Number: 12/01/2023, 9:14 AM   Clinical Narrative: PTAR called. Packet @ nsg station. Nurse aware.going to [facility] rm#[id number],report [phone]. No further CM needs.      Final next level of care: Assisted Living Barriers to Discharge: No Barriers Identified   Patient Goals and CMS Choice Patient states their goals for this hospitalization and ongoing recovery are:: back to ALF @ [facility].gov Compare Post Acute Care list provided to:: Patient Represenative (must comment) Choice offered to / list presented to : Adult Children [place] ownership interest in [facility].provided to:: Adult Children    Discharge Placement                Patient to be transferred to facility by: PTAR Name of family member notified: Deborah(dtr) Patient and family notified of of transfer: 11/30/23  Discharge Plan and Services Additional resources added to the After Visit Summary for     Discharge Planning Services: CM Consult                                 Social Drivers of Health (SDOH) Interventions SDOH Screenings   Food Insecurity: No Food Insecurity (11/27/2023)  Housing: Low Risk  (11/27/2023)  Transportation Needs: No Transportation Needs (11/27/2023)  Utilities: Not At Risk (11/27/2023)  Social Connections: Moderately Integrated (11/25/2023)  Tobacco Use: Low Risk  (11/25/2023)     Readmission Risk Interventions     No data to display

## 2023-12-02 DIAGNOSIS — J189 Pneumonia, unspecified organism: Secondary | ICD-10-CM | POA: Diagnosis not present

## 2023-12-02 DIAGNOSIS — N189 Chronic kidney disease, unspecified: Secondary | ICD-10-CM | POA: Diagnosis not present

## 2023-12-02 DIAGNOSIS — D631 Anemia in chronic kidney disease: Secondary | ICD-10-CM | POA: Diagnosis not present

## 2023-12-02 DIAGNOSIS — E1122 Type 2 diabetes mellitus with diabetic chronic kidney disease: Secondary | ICD-10-CM | POA: Diagnosis not present

## 2023-12-02 DIAGNOSIS — F0284 Dementia in other diseases classified elsewhere, unspecified severity, with anxiety: Secondary | ICD-10-CM | POA: Diagnosis not present

## 2023-12-02 DIAGNOSIS — I872 Venous insufficiency (chronic) (peripheral): Secondary | ICD-10-CM | POA: Diagnosis not present

## 2023-12-02 DIAGNOSIS — I129 Hypertensive chronic kidney disease with stage 1 through stage 4 chronic kidney disease, or unspecified chronic kidney disease: Secondary | ICD-10-CM | POA: Diagnosis not present

## 2023-12-02 DIAGNOSIS — G9341 Metabolic encephalopathy: Secondary | ICD-10-CM | POA: Diagnosis not present

## 2023-12-02 DIAGNOSIS — G301 Alzheimer's disease with late onset: Secondary | ICD-10-CM | POA: Diagnosis not present

## 2023-12-04 ENCOUNTER — Encounter: Payer: Self-pay | Admitting: Internal Medicine

## 2023-12-07 DIAGNOSIS — R1310 Dysphagia, unspecified: Secondary | ICD-10-CM | POA: Diagnosis not present

## 2023-12-07 DIAGNOSIS — I129 Hypertensive chronic kidney disease with stage 1 through stage 4 chronic kidney disease, or unspecified chronic kidney disease: Secondary | ICD-10-CM | POA: Diagnosis not present

## 2023-12-07 DIAGNOSIS — D519 Vitamin B12 deficiency anemia, unspecified: Secondary | ICD-10-CM | POA: Diagnosis not present

## 2023-12-07 DIAGNOSIS — E1122 Type 2 diabetes mellitus with diabetic chronic kidney disease: Secondary | ICD-10-CM | POA: Diagnosis not present

## 2023-12-07 DIAGNOSIS — Z8701 Personal history of pneumonia (recurrent): Secondary | ICD-10-CM | POA: Diagnosis not present

## 2023-12-07 DIAGNOSIS — G301 Alzheimer's disease with late onset: Secondary | ICD-10-CM | POA: Diagnosis not present

## 2023-12-07 DIAGNOSIS — G309 Alzheimer's disease, unspecified: Secondary | ICD-10-CM | POA: Diagnosis not present

## 2023-12-07 DIAGNOSIS — N189 Chronic kidney disease, unspecified: Secondary | ICD-10-CM | POA: Diagnosis not present

## 2023-12-07 DIAGNOSIS — G9341 Metabolic encephalopathy: Secondary | ICD-10-CM | POA: Diagnosis not present

## 2023-12-07 DIAGNOSIS — E1139 Type 2 diabetes mellitus with other diabetic ophthalmic complication: Secondary | ICD-10-CM | POA: Diagnosis not present

## 2023-12-07 DIAGNOSIS — D631 Anemia in chronic kidney disease: Secondary | ICD-10-CM | POA: Diagnosis not present

## 2023-12-07 DIAGNOSIS — F0284 Dementia in other diseases classified elsewhere, unspecified severity, with anxiety: Secondary | ICD-10-CM | POA: Diagnosis not present

## 2023-12-07 DIAGNOSIS — I872 Venous insufficiency (chronic) (peripheral): Secondary | ICD-10-CM | POA: Diagnosis not present

## 2023-12-07 DIAGNOSIS — N400 Enlarged prostate without lower urinary tract symptoms: Secondary | ICD-10-CM | POA: Diagnosis not present

## 2023-12-07 DIAGNOSIS — F0280 Dementia in other diseases classified elsewhere without behavioral disturbance: Secondary | ICD-10-CM | POA: Diagnosis not present

## 2023-12-07 DIAGNOSIS — E559 Vitamin D deficiency, unspecified: Secondary | ICD-10-CM | POA: Diagnosis not present

## 2023-12-07 DIAGNOSIS — J189 Pneumonia, unspecified organism: Secondary | ICD-10-CM | POA: Diagnosis not present

## 2023-12-09 DIAGNOSIS — G9341 Metabolic encephalopathy: Secondary | ICD-10-CM | POA: Diagnosis not present

## 2023-12-09 DIAGNOSIS — E1139 Type 2 diabetes mellitus with other diabetic ophthalmic complication: Secondary | ICD-10-CM | POA: Diagnosis not present

## 2023-12-09 DIAGNOSIS — D631 Anemia in chronic kidney disease: Secondary | ICD-10-CM | POA: Diagnosis not present

## 2023-12-09 DIAGNOSIS — G301 Alzheimer's disease with late onset: Secondary | ICD-10-CM | POA: Diagnosis not present

## 2023-12-09 DIAGNOSIS — E1122 Type 2 diabetes mellitus with diabetic chronic kidney disease: Secondary | ICD-10-CM | POA: Diagnosis not present

## 2023-12-09 DIAGNOSIS — I129 Hypertensive chronic kidney disease with stage 1 through stage 4 chronic kidney disease, or unspecified chronic kidney disease: Secondary | ICD-10-CM | POA: Diagnosis not present

## 2023-12-09 DIAGNOSIS — F0284 Dementia in other diseases classified elsewhere, unspecified severity, with anxiety: Secondary | ICD-10-CM | POA: Diagnosis not present

## 2023-12-09 DIAGNOSIS — N189 Chronic kidney disease, unspecified: Secondary | ICD-10-CM | POA: Diagnosis not present

## 2023-12-09 DIAGNOSIS — J189 Pneumonia, unspecified organism: Secondary | ICD-10-CM | POA: Diagnosis not present

## 2023-12-09 DIAGNOSIS — D519 Vitamin B12 deficiency anemia, unspecified: Secondary | ICD-10-CM | POA: Diagnosis not present

## 2023-12-09 DIAGNOSIS — I872 Venous insufficiency (chronic) (peripheral): Secondary | ICD-10-CM | POA: Diagnosis not present

## 2023-12-09 DIAGNOSIS — E559 Vitamin D deficiency, unspecified: Secondary | ICD-10-CM | POA: Diagnosis not present

## 2023-12-10 DIAGNOSIS — N189 Chronic kidney disease, unspecified: Secondary | ICD-10-CM | POA: Diagnosis not present

## 2023-12-10 DIAGNOSIS — I129 Hypertensive chronic kidney disease with stage 1 through stage 4 chronic kidney disease, or unspecified chronic kidney disease: Secondary | ICD-10-CM | POA: Diagnosis not present

## 2023-12-10 DIAGNOSIS — D631 Anemia in chronic kidney disease: Secondary | ICD-10-CM | POA: Diagnosis not present

## 2023-12-10 DIAGNOSIS — I872 Venous insufficiency (chronic) (peripheral): Secondary | ICD-10-CM | POA: Diagnosis not present

## 2023-12-10 DIAGNOSIS — D72829 Elevated white blood cell count, unspecified: Secondary | ICD-10-CM | POA: Diagnosis not present

## 2023-12-10 DIAGNOSIS — J189 Pneumonia, unspecified organism: Secondary | ICD-10-CM | POA: Diagnosis not present

## 2023-12-10 DIAGNOSIS — R918 Other nonspecific abnormal finding of lung field: Secondary | ICD-10-CM | POA: Diagnosis not present

## 2023-12-10 DIAGNOSIS — G9341 Metabolic encephalopathy: Secondary | ICD-10-CM | POA: Diagnosis not present

## 2023-12-10 DIAGNOSIS — G301 Alzheimer's disease with late onset: Secondary | ICD-10-CM | POA: Diagnosis not present

## 2023-12-10 DIAGNOSIS — E1122 Type 2 diabetes mellitus with diabetic chronic kidney disease: Secondary | ICD-10-CM | POA: Diagnosis not present

## 2023-12-10 DIAGNOSIS — F0284 Dementia in other diseases classified elsewhere, unspecified severity, with anxiety: Secondary | ICD-10-CM | POA: Diagnosis not present

## 2023-12-11 DIAGNOSIS — D72829 Elevated white blood cell count, unspecified: Secondary | ICD-10-CM | POA: Diagnosis not present

## 2023-12-11 DIAGNOSIS — N189 Chronic kidney disease, unspecified: Secondary | ICD-10-CM | POA: Diagnosis not present

## 2023-12-11 DIAGNOSIS — I129 Hypertensive chronic kidney disease with stage 1 through stage 4 chronic kidney disease, or unspecified chronic kidney disease: Secondary | ICD-10-CM | POA: Diagnosis not present

## 2023-12-11 DIAGNOSIS — G9341 Metabolic encephalopathy: Secondary | ICD-10-CM | POA: Diagnosis not present

## 2023-12-11 DIAGNOSIS — F0284 Dementia in other diseases classified elsewhere, unspecified severity, with anxiety: Secondary | ICD-10-CM | POA: Diagnosis not present

## 2023-12-11 DIAGNOSIS — J189 Pneumonia, unspecified organism: Secondary | ICD-10-CM | POA: Diagnosis not present

## 2023-12-11 DIAGNOSIS — D631 Anemia in chronic kidney disease: Secondary | ICD-10-CM | POA: Diagnosis not present

## 2023-12-11 DIAGNOSIS — I872 Venous insufficiency (chronic) (peripheral): Secondary | ICD-10-CM | POA: Diagnosis not present

## 2023-12-11 DIAGNOSIS — E1122 Type 2 diabetes mellitus with diabetic chronic kidney disease: Secondary | ICD-10-CM | POA: Diagnosis not present

## 2023-12-11 DIAGNOSIS — G301 Alzheimer's disease with late onset: Secondary | ICD-10-CM | POA: Diagnosis not present

## 2023-12-12 DIAGNOSIS — D72829 Elevated white blood cell count, unspecified: Secondary | ICD-10-CM | POA: Diagnosis not present

## 2023-12-13 DIAGNOSIS — I129 Hypertensive chronic kidney disease with stage 1 through stage 4 chronic kidney disease, or unspecified chronic kidney disease: Secondary | ICD-10-CM | POA: Diagnosis not present

## 2023-12-13 DIAGNOSIS — G309 Alzheimer's disease, unspecified: Secondary | ICD-10-CM | POA: Diagnosis not present

## 2023-12-14 DIAGNOSIS — R1310 Dysphagia, unspecified: Secondary | ICD-10-CM | POA: Diagnosis not present

## 2023-12-14 DIAGNOSIS — F0284 Dementia in other diseases classified elsewhere, unspecified severity, with anxiety: Secondary | ICD-10-CM | POA: Diagnosis not present

## 2023-12-14 DIAGNOSIS — E1122 Type 2 diabetes mellitus with diabetic chronic kidney disease: Secondary | ICD-10-CM | POA: Diagnosis not present

## 2023-12-14 DIAGNOSIS — G9341 Metabolic encephalopathy: Secondary | ICD-10-CM | POA: Diagnosis not present

## 2023-12-14 DIAGNOSIS — N189 Chronic kidney disease, unspecified: Secondary | ICD-10-CM | POA: Diagnosis not present

## 2023-12-14 DIAGNOSIS — D631 Anemia in chronic kidney disease: Secondary | ICD-10-CM | POA: Diagnosis not present

## 2023-12-14 DIAGNOSIS — J189 Pneumonia, unspecified organism: Secondary | ICD-10-CM | POA: Diagnosis not present

## 2023-12-14 DIAGNOSIS — I872 Venous insufficiency (chronic) (peripheral): Secondary | ICD-10-CM | POA: Diagnosis not present

## 2023-12-14 DIAGNOSIS — L24A2 Irritant contact dermatitis due to fecal, urinary or dual incontinence: Secondary | ICD-10-CM | POA: Diagnosis not present

## 2023-12-14 DIAGNOSIS — G301 Alzheimer's disease with late onset: Secondary | ICD-10-CM | POA: Diagnosis not present

## 2023-12-14 DIAGNOSIS — D72829 Elevated white blood cell count, unspecified: Secondary | ICD-10-CM | POA: Diagnosis not present

## 2023-12-14 DIAGNOSIS — J158 Pneumonia due to other specified bacteria: Secondary | ICD-10-CM | POA: Diagnosis not present

## 2023-12-14 DIAGNOSIS — I129 Hypertensive chronic kidney disease with stage 1 through stage 4 chronic kidney disease, or unspecified chronic kidney disease: Secondary | ICD-10-CM | POA: Diagnosis not present

## 2023-12-15 DIAGNOSIS — F0284 Dementia in other diseases classified elsewhere, unspecified severity, with anxiety: Secondary | ICD-10-CM | POA: Diagnosis not present

## 2023-12-15 DIAGNOSIS — I129 Hypertensive chronic kidney disease with stage 1 through stage 4 chronic kidney disease, or unspecified chronic kidney disease: Secondary | ICD-10-CM | POA: Diagnosis not present

## 2023-12-15 DIAGNOSIS — G9341 Metabolic encephalopathy: Secondary | ICD-10-CM | POA: Diagnosis not present

## 2023-12-15 DIAGNOSIS — I872 Venous insufficiency (chronic) (peripheral): Secondary | ICD-10-CM | POA: Diagnosis not present

## 2023-12-15 DIAGNOSIS — G301 Alzheimer's disease with late onset: Secondary | ICD-10-CM | POA: Diagnosis not present

## 2023-12-15 DIAGNOSIS — N189 Chronic kidney disease, unspecified: Secondary | ICD-10-CM | POA: Diagnosis not present

## 2023-12-15 DIAGNOSIS — J189 Pneumonia, unspecified organism: Secondary | ICD-10-CM | POA: Diagnosis not present

## 2023-12-15 DIAGNOSIS — D631 Anemia in chronic kidney disease: Secondary | ICD-10-CM | POA: Diagnosis not present

## 2023-12-15 DIAGNOSIS — E1122 Type 2 diabetes mellitus with diabetic chronic kidney disease: Secondary | ICD-10-CM | POA: Diagnosis not present

## 2023-12-16 DIAGNOSIS — D631 Anemia in chronic kidney disease: Secondary | ICD-10-CM | POA: Diagnosis not present

## 2023-12-16 DIAGNOSIS — E1122 Type 2 diabetes mellitus with diabetic chronic kidney disease: Secondary | ICD-10-CM | POA: Diagnosis not present

## 2023-12-16 DIAGNOSIS — G301 Alzheimer's disease with late onset: Secondary | ICD-10-CM | POA: Diagnosis not present

## 2023-12-16 DIAGNOSIS — J189 Pneumonia, unspecified organism: Secondary | ICD-10-CM | POA: Diagnosis not present

## 2023-12-16 DIAGNOSIS — N189 Chronic kidney disease, unspecified: Secondary | ICD-10-CM | POA: Diagnosis not present

## 2023-12-16 DIAGNOSIS — G9341 Metabolic encephalopathy: Secondary | ICD-10-CM | POA: Diagnosis not present

## 2023-12-16 DIAGNOSIS — I129 Hypertensive chronic kidney disease with stage 1 through stage 4 chronic kidney disease, or unspecified chronic kidney disease: Secondary | ICD-10-CM | POA: Diagnosis not present

## 2023-12-16 DIAGNOSIS — I872 Venous insufficiency (chronic) (peripheral): Secondary | ICD-10-CM | POA: Diagnosis not present

## 2023-12-16 DIAGNOSIS — F0284 Dementia in other diseases classified elsewhere, unspecified severity, with anxiety: Secondary | ICD-10-CM | POA: Diagnosis not present

## 2023-12-16 NOTE — Progress Notes (Signed)
 PROGRESS NOTE    [name] [name]  ZOX:[id number] DOB: 11/12/1928 DOA: 11/25/2023 PCP: [name], MD   Brief Narrative:  [name] [name] is a 89 y.o. male with medical history significant for insulin-dependent diabetes, dementia being admitted to the hospital with bilateral community-acquired pneumonia and associated acute hypoxic respiratory failure.    Assessment & Plan:   Principal Problem:   CAP (community acquired pneumonia)   Community-acquired pneumonia Patient presented from his assisted living facility with cough and general malaise.  On chest x-ray he was noted to have opacities in the bilateral bases.  Patient remains afebrile and has no leukocytosis. -Continue ceftriaxone and azithromycin    Type 2 diabetes Well-controlled for patient's age.  A1c of 6.8. Noted to have fingerstick blood glucose of 44 on admission and on BMP had a blood glucose of 67.  Will hold patient's home insulin regimen.  This can likely be discontinued at discharge.   Patient more awake will change CBG to meal time.    Macrocytic anemia Stable. B12 is WNL Folate WNL, iron levels mildly decreased.   Prior CVA Continue aspirin   Cognitive impairment At baseline; Continue prevagen    DVT prophylaxis:    Code Status:   Code Status: Prior  Family Communication: None present  Status is: Inpatient  Dispo: The patient is from: Assisted living              Anticipated d/c is to: SNF              Anticipated d/c date is: Imminent              Patient currently is medically stable for discharge  Consultants:  None  Procedures:  None  Antimicrobials:  Azithromycin, ceftriaxone  Subjective: No acute issues or events overnight, review of systems somewhat limited by patient's mental status  Objective: Vitals:   11/30/23 1254 11/30/23 1957 11/30/23 2049 12/01/23 0455  BP: 109/64 115/66 110/70 128/68  Pulse: 72 66 70 66  Resp: 17 18 16 17   Temp: 97.8 F (36.6 C) 97.6  F (36.4 C)  97.8 F (36.6 C)  TempSrc: Oral     SpO2: 98% 100% 99% 100%  Weight:      Height:       No intake or output data in the 24 hours ending 12/16/23 0723  Filed Weights   11/25/23 0546  Weight: 74.8 kg    Examination:  General:  Pleasantly resting in bed, No acute distress.  Alert to person only HEENT:  Normocephalic atraumatic.  Sclerae nonicteric, noninjected.  Extraocular movements intact bilaterally. Neck:  Without mass or deformity.  Trachea is midline. Lungs: Diffuse rhonchi without wheeze or rales. Heart:  Regular rate and rhythm.  Without murmurs, rubs, or gallops. Abdomen:  Soft, nontender, nondistended.  Without guarding or rebound. Extremities: Without cyanosis, clubbing, edema, or obvious deformity. Skin:  Warm and dry, no erythema.  Data Reviewed: I have personally reviewed following labs and imaging studies  CBC: No results for input(s): "WBC", "NEUTROABS", "HGB", "HCT", "MCV", "PLT" in the last 168 hours.  Basic Metabolic Panel: No results for input(s): "NA", "K", "CL", "CO2", "GLUCOSE", "BUN", "CREATININE", "CALCIUM", "MG", "PHOS" in the last 168 hours.  GFR: Estimated Creatinine Clearance: 48.6 mL/min (by C-G formula based on SCr of 0.97 mg/dL). Liver Function Tests: No results for input(s): "AST", "ALT", "ALKPHOS", "BILITOT", "PROT", "ALBUMIN" in the last 168 hours.  Coagulation Profile: No results for input(s): "INR", "PROTIME" in the last 168 hours.  Cardiac Enzymes: No results for input(s): "CKTOTAL", "CKMB", "CKMBINDEX", "TROPONINI" in the last 168 hours. BNP (last 3 results) No results for input(s): "PROBNP" in the last 8760 hours. HbA1C: No results for input(s): "HGBA1C" in the last 72 hours.  CBG: No results for input(s): "GLUCAP" in the last 168 hours.  Lipid Profile: No results for input(s): "CHOL", "HDL", "LDLCALC", "TRIG", "CHOLHDL", "LDLDIRECT" in the last 72 hours. Thyroid Function Tests: No results for input(s): "TSH",  "T4TOTAL", "FREET4", "T3FREE", "THYROIDAB" in the last 72 hours. Anemia Panel: No results for input(s): "VITAMINB12", "FOLATE", "FERRITIN", "TIBC", "IRON", "RETICCTPCT" in the last 72 hours.  Sepsis Labs: No results for input(s): "PROCALCITON", "LATICACIDVEN" in the last 168 hours.   No results found for this or any previous visit (from the past 240 hours).  Radiology Studies: No results found.  Scheduled Meds:   Continuous Infusions:     LOS: 6 days   Time spent:  Azucena Fallen, DO [facility]  If 7PM-7AM, please contact night-coverage [email or web address]  12/16/2023, 7:23 AM

## 2023-12-17 DIAGNOSIS — I129 Hypertensive chronic kidney disease with stage 1 through stage 4 chronic kidney disease, or unspecified chronic kidney disease: Secondary | ICD-10-CM | POA: Diagnosis not present

## 2023-12-17 DIAGNOSIS — J189 Pneumonia, unspecified organism: Secondary | ICD-10-CM | POA: Diagnosis not present

## 2023-12-17 DIAGNOSIS — G301 Alzheimer's disease with late onset: Secondary | ICD-10-CM | POA: Diagnosis not present

## 2023-12-17 DIAGNOSIS — D631 Anemia in chronic kidney disease: Secondary | ICD-10-CM | POA: Diagnosis not present

## 2023-12-17 DIAGNOSIS — F0284 Dementia in other diseases classified elsewhere, unspecified severity, with anxiety: Secondary | ICD-10-CM | POA: Diagnosis not present

## 2023-12-17 DIAGNOSIS — N189 Chronic kidney disease, unspecified: Secondary | ICD-10-CM | POA: Diagnosis not present

## 2023-12-17 DIAGNOSIS — I872 Venous insufficiency (chronic) (peripheral): Secondary | ICD-10-CM | POA: Diagnosis not present

## 2023-12-17 DIAGNOSIS — G9341 Metabolic encephalopathy: Secondary | ICD-10-CM | POA: Diagnosis not present

## 2023-12-17 DIAGNOSIS — E1122 Type 2 diabetes mellitus with diabetic chronic kidney disease: Secondary | ICD-10-CM | POA: Diagnosis not present

## 2023-12-21 DIAGNOSIS — G309 Alzheimer's disease, unspecified: Secondary | ICD-10-CM | POA: Diagnosis not present

## 2023-12-21 DIAGNOSIS — F0284 Dementia in other diseases classified elsewhere, unspecified severity, with anxiety: Secondary | ICD-10-CM | POA: Diagnosis not present

## 2023-12-21 DIAGNOSIS — E1122 Type 2 diabetes mellitus with diabetic chronic kidney disease: Secondary | ICD-10-CM | POA: Diagnosis not present

## 2023-12-21 DIAGNOSIS — G301 Alzheimer's disease with late onset: Secondary | ICD-10-CM | POA: Diagnosis not present

## 2023-12-21 DIAGNOSIS — N189 Chronic kidney disease, unspecified: Secondary | ICD-10-CM | POA: Diagnosis not present

## 2023-12-21 DIAGNOSIS — I129 Hypertensive chronic kidney disease with stage 1 through stage 4 chronic kidney disease, or unspecified chronic kidney disease: Secondary | ICD-10-CM | POA: Diagnosis not present

## 2023-12-21 DIAGNOSIS — I872 Venous insufficiency (chronic) (peripheral): Secondary | ICD-10-CM | POA: Diagnosis not present

## 2023-12-21 DIAGNOSIS — Z515 Encounter for palliative care: Secondary | ICD-10-CM | POA: Diagnosis not present

## 2023-12-21 DIAGNOSIS — D631 Anemia in chronic kidney disease: Secondary | ICD-10-CM | POA: Diagnosis not present

## 2023-12-21 DIAGNOSIS — R627 Adult failure to thrive: Secondary | ICD-10-CM | POA: Diagnosis not present

## 2023-12-21 DIAGNOSIS — N390 Urinary tract infection, site not specified: Secondary | ICD-10-CM | POA: Diagnosis not present

## 2023-12-21 DIAGNOSIS — F0280 Dementia in other diseases classified elsewhere without behavioral disturbance: Secondary | ICD-10-CM | POA: Diagnosis not present

## 2023-12-21 DIAGNOSIS — J189 Pneumonia, unspecified organism: Secondary | ICD-10-CM | POA: Diagnosis not present

## 2023-12-21 DIAGNOSIS — G9341 Metabolic encephalopathy: Secondary | ICD-10-CM | POA: Diagnosis not present

## 2023-12-23 DIAGNOSIS — G9341 Metabolic encephalopathy: Secondary | ICD-10-CM | POA: Diagnosis not present

## 2023-12-23 DIAGNOSIS — F0284 Dementia in other diseases classified elsewhere, unspecified severity, with anxiety: Secondary | ICD-10-CM | POA: Diagnosis not present

## 2023-12-23 DIAGNOSIS — I129 Hypertensive chronic kidney disease with stage 1 through stage 4 chronic kidney disease, or unspecified chronic kidney disease: Secondary | ICD-10-CM | POA: Diagnosis not present

## 2023-12-23 DIAGNOSIS — E1122 Type 2 diabetes mellitus with diabetic chronic kidney disease: Secondary | ICD-10-CM | POA: Diagnosis not present

## 2023-12-23 DIAGNOSIS — G301 Alzheimer's disease with late onset: Secondary | ICD-10-CM | POA: Diagnosis not present

## 2023-12-23 DIAGNOSIS — D631 Anemia in chronic kidney disease: Secondary | ICD-10-CM | POA: Diagnosis not present

## 2023-12-23 DIAGNOSIS — J189 Pneumonia, unspecified organism: Secondary | ICD-10-CM | POA: Diagnosis not present

## 2023-12-23 DIAGNOSIS — I872 Venous insufficiency (chronic) (peripheral): Secondary | ICD-10-CM | POA: Diagnosis not present

## 2023-12-23 DIAGNOSIS — N189 Chronic kidney disease, unspecified: Secondary | ICD-10-CM | POA: Diagnosis not present

## 2023-12-24 DIAGNOSIS — I872 Venous insufficiency (chronic) (peripheral): Secondary | ICD-10-CM | POA: Diagnosis not present

## 2023-12-24 DIAGNOSIS — E1122 Type 2 diabetes mellitus with diabetic chronic kidney disease: Secondary | ICD-10-CM | POA: Diagnosis not present

## 2023-12-24 DIAGNOSIS — G9341 Metabolic encephalopathy: Secondary | ICD-10-CM | POA: Diagnosis not present

## 2023-12-24 DIAGNOSIS — J189 Pneumonia, unspecified organism: Secondary | ICD-10-CM | POA: Diagnosis not present

## 2023-12-24 DIAGNOSIS — F0284 Dementia in other diseases classified elsewhere, unspecified severity, with anxiety: Secondary | ICD-10-CM | POA: Diagnosis not present

## 2023-12-24 DIAGNOSIS — I129 Hypertensive chronic kidney disease with stage 1 through stage 4 chronic kidney disease, or unspecified chronic kidney disease: Secondary | ICD-10-CM | POA: Diagnosis not present

## 2023-12-24 DIAGNOSIS — G301 Alzheimer's disease with late onset: Secondary | ICD-10-CM | POA: Diagnosis not present

## 2023-12-24 DIAGNOSIS — D631 Anemia in chronic kidney disease: Secondary | ICD-10-CM | POA: Diagnosis not present

## 2023-12-24 DIAGNOSIS — N189 Chronic kidney disease, unspecified: Secondary | ICD-10-CM | POA: Diagnosis not present

## 2023-12-28 DIAGNOSIS — Z515 Encounter for palliative care: Secondary | ICD-10-CM | POA: Diagnosis not present

## 2023-12-28 DIAGNOSIS — G309 Alzheimer's disease, unspecified: Secondary | ICD-10-CM | POA: Diagnosis not present

## 2023-12-28 DIAGNOSIS — R0902 Hypoxemia: Secondary | ICD-10-CM | POA: Diagnosis not present

## 2023-12-28 DIAGNOSIS — R627 Adult failure to thrive: Secondary | ICD-10-CM | POA: Diagnosis not present

## 2023-12-28 DIAGNOSIS — F0280 Dementia in other diseases classified elsewhere without behavioral disturbance: Secondary | ICD-10-CM | POA: Diagnosis not present

## 2023-12-28 DIAGNOSIS — J189 Pneumonia, unspecified organism: Secondary | ICD-10-CM | POA: Diagnosis not present

## 2023-12-29 ENCOUNTER — Other Ambulatory Visit: Payer: Self-pay

## 2023-12-29 ENCOUNTER — Emergency Department (HOSPITAL_COMMUNITY)

## 2023-12-29 ENCOUNTER — Emergency Department (HOSPITAL_COMMUNITY)
Admission: EM | Admit: 2023-12-29 | Discharge: 2023-12-29 | Disposition: A | Discharge status: Home / Self Care | Attending: Emergency Medicine | Admitting: Emergency Medicine

## 2023-12-29 ENCOUNTER — Encounter (HOSPITAL_COMMUNITY): Payer: Self-pay | Admitting: Emergency Medicine

## 2023-12-29 DIAGNOSIS — M160 Bilateral primary osteoarthritis of hip: Secondary | ICD-10-CM | POA: Diagnosis not present

## 2023-12-29 DIAGNOSIS — Z794 Long term (current) use of insulin: Secondary | ICD-10-CM | POA: Insufficient documentation

## 2023-12-29 DIAGNOSIS — S199XXA Unspecified injury of neck, initial encounter: Secondary | ICD-10-CM | POA: Diagnosis not present

## 2023-12-29 DIAGNOSIS — S0990XA Unspecified injury of head, initial encounter: Secondary | ICD-10-CM | POA: Diagnosis not present

## 2023-12-29 DIAGNOSIS — W19XXXA Unspecified fall, initial encounter: Secondary | ICD-10-CM | POA: Diagnosis not present

## 2023-12-29 DIAGNOSIS — M542 Cervicalgia: Secondary | ICD-10-CM | POA: Diagnosis not present

## 2023-12-29 DIAGNOSIS — W050XXA Fall from non-moving wheelchair, initial encounter: Secondary | ICD-10-CM | POA: Diagnosis not present

## 2023-12-29 DIAGNOSIS — Z7401 Bed confinement status: Secondary | ICD-10-CM | POA: Diagnosis not present

## 2023-12-29 DIAGNOSIS — R531 Weakness: Secondary | ICD-10-CM | POA: Diagnosis not present

## 2023-12-29 DIAGNOSIS — R9089 Other abnormal findings on diagnostic imaging of central nervous system: Secondary | ICD-10-CM | POA: Diagnosis not present

## 2023-12-29 DIAGNOSIS — Z7982 Long term (current) use of aspirin: Secondary | ICD-10-CM | POA: Insufficient documentation

## 2023-12-29 DIAGNOSIS — I6782 Cerebral ischemia: Secondary | ICD-10-CM | POA: Diagnosis not present

## 2023-12-29 DIAGNOSIS — S3993XA Unspecified injury of pelvis, initial encounter: Secondary | ICD-10-CM | POA: Diagnosis not present

## 2023-12-29 LAB — CBG MONITORING, ED: Glucose-Capillary: 177 mg/dL — ABNORMAL HIGH (ref 70–99)

## 2023-12-29 NOTE — ED Notes (Signed)
 PT to CT via stretcher in stable condition

## 2023-12-29 NOTE — ED Triage Notes (Addendum)
 89 y/o male comes in after an unwitnessed fall out of his wheelchair this morning. Per EMS, pt is not on blood thinners and pt was found on the ground outside of his wheelchair by staff. On arrival, pt has a c-collar in place and denies any pain or discomfort. Pt is alert to self, but not alert to place or situation. No obvious injuries or trauma noted

## 2023-12-29 NOTE — ED Notes (Signed)
 [facility] contacted for transport to SNF.

## 2023-12-29 NOTE — ED Provider Notes (Signed)
 [place] EMERGENCY DEPARTMENT AT [facility] Provider Note   CSN: [id number] Arrival date & time: 12/29/23  4098     History Chief Complaint  Patient presents with   [name] [name] is a 89 y.o. male.  Patient presents to the emergency department today for concerns of an unwitnessed fall.  Patient reportedly fell out of his wheelchair this morning.  EMS reports that patient not on blood thinners and was found in the ground outside by staff.  Patient was placed into a c-collar but denies any area pain or discomfort.  Patient is alert but minimally responsive to any questioning.  He denies any focal area of pain although he only responds with shaking his head yes or no.   Fall       Home Medications Prior to Admission medications   Medication Sig Start Date End Date Taking? Authorizing Provider  acetaminophen (TYLENOL) 325 MG tablet Take 650 mg by mouth every 6 (six) hours as needed. 12/28/23   [provider]  albuterol (VENTOLIN HFA) 108 (90 Base) MCG/ACT inhaler Inhale 2 puffs into the lungs every 6 (six) hours as needed. 12/02/23   [provider]  amoxicillin-clavulanate (AUGMENTIN) 250-62.5 MG/5ML suspension Take by mouth. 12/29/23   [provider]  Apoaequorin (PREVAGEN) 10 MG CAPS Take 10 mg by mouth daily.    [provider]  aspirin EC 325 MG EC tablet Take 1 tablet (325 mg total) by mouth daily. 04/06/17   Rai, Delene Ruffini, MD  B-D ULTRAFINE III SHORT PEN 31G X 8 MM MISC  12/28/14   [provider]  BAZA ANTIFUNGAL 2 % cream Apply 1 Application topically 2 (two) times daily. 12/11/23   [provider]  BAZA PROTECT MOISTURE BARRIER 12 % CREA Apply 1 Application topically 2 (two) times daily. 12/14/23   [provider]  Cholecalciferol (VITAMIN D-3 PO) Take 1,000 Units by mouth daily.     [provider]  citalopram (CELEXA) 20 MG tablet TAKE 1 TABLET (20 MG TOTAL) BY MOUTH DAILY.  08/04/16   Dohmeier, Porfirio Mylar, MD  clobetasol cream (TEMOVATE) 0.05 % Apply 1 Application topically 2 (two) times daily. 11/20/23   [provider]  cyanocobalamin (VITAMIN B12) 500 MCG tablet Take 500 mcg by mouth daily.    [provider]  doxycycline (VIBRA-TABS) 100 MG tablet Take 100 mg by mouth 2 (two) times daily. 12/29/23   [provider]  FLORASTOR 250 MG capsule Take 500 mg by mouth 2 (two) times daily. 12/14/23   [provider]  guaifenesin (HUMIBID E) 400 MG TABS tablet Take 400 mg by mouth 3 (three) times daily. 12/02/23   [provider]  insulin glargine (LANTUS) 100 UNIT/ML injection Inject 4 Units into the skin daily after supper.    [provider]  LANSOPRAZOLE PO Take 10 mg by mouth every 12 (twelve) hours as needed (Acid reflux).    [provider]  loratadine-pseudoephedrine (CLARITIN-D 24-HOUR) 10-240 MG per 24 hr tablet Take 1 tablet by mouth daily as needed for allergies.    [provider]  LORazepam (ATIVAN) 0.5 MG tablet Take by mouth. 12/28/23   [provider]  MACROBID 100 MG capsule Take 100 mg by mouth 2 (two) times daily. 12/18/23   [provider]  mirtazapine (REMERON) 7.5 MG tablet Take 7.5 mg by mouth at bedtime. 12/21/23   [provider]  Multiple Vitamin (MULTIVITAMIN WITH MINERALS) TABS Take 1 tablet  by mouth daily.    [provider]  tamsulosin (FLOMAX) 0.4 MG CAPS capsule Take 0.4 mg by mouth daily.    [provider]      Allergies    Donepezil, Lisinopril, Methocarbamol, Namenda [memantine hcl], and Other    Review of Systems   Review of Systems  Musculoskeletal:        Fall  All other systems reviewed and are negative.   Physical Exam Updated Vital Signs BP 116/81 (BP Location: Right Arm)   Pulse 84   Temp 97.8 F (36.6 C) (Oral)   Resp 20   Ht 5\' 9"  (1.753 m)   Wt 74 kg   SpO2 95%   BMI 24.09 kg/m  Physical Exam Vitals and  nursing note reviewed.  Constitutional:      General: He is not in acute distress.    Appearance: He is well-developed.  HENT:     Head: Normocephalic and atraumatic.  Eyes:     Conjunctiva/sclera: Conjunctivae normal.  Cardiovascular:     Rate and Rhythm: Normal rate and regular rhythm.     Heart sounds: No murmur heard. Pulmonary:     Effort: Pulmonary effort is normal. No respiratory distress.     Breath sounds: Normal breath sounds.  Abdominal:     Palpations: Abdomen is soft.     Tenderness: There is no abdominal tenderness.  Musculoskeletal:        General: No swelling, tenderness, deformity or signs of injury.     Cervical back: Neck supple.     Comments: Palpation of pelvis, torso, upper and lower extremities reveals no focal tenderness.  Skin:    General: Skin is warm and dry.     Capillary Refill: Capillary refill takes less than 2 seconds.  Neurological:     Mental Status: He is alert.  Psychiatric:        Mood and Affect: Mood normal.     ED Results / Procedures / Treatments   Labs (all labs ordered are listed, but only abnormal results are displayed) Labs Reviewed  CBG MONITORING, ED - Abnormal; Notable for the following components:      Result Value   Glucose-Capillary 177 (*)    All other components within normal limits    EKG None  Radiology CT Head Wo Contrast Result Date: 12/29/2023 CLINICAL DATA:  Head trauma, unwitnessed fall out of wheelchair this morning. EXAM: CT HEAD WITHOUT CONTRAST CT CERVICAL SPINE WITHOUT CONTRAST TECHNIQUE: Multidetector CT imaging of the head and cervical spine was performed following the standard protocol without intravenous contrast. Multiplanar CT image reconstructions of the cervical spine were also generated. RADIATION DOSE REDUCTION: This exam was performed according to the departmental dose-optimization program which includes automated exposure control, adjustment of the mA and/or kV according to patient size and/or  use of iterative reconstruction technique. COMPARISON:  CT head 01/19/2023. FINDINGS: CT HEAD FINDINGS Brain: No acute intracranial hemorrhage. No CT evidence of acute infarct. Nonspecific hypoattenuation in the periventricular and subcortical white matter favored to reflect chronic microvascular ischemic changes. Moderate generalized parenchymal volume loss. Remote infarct in the left corona radiata extending into the basal ganglia. No edema, mass effect, or midline shift. The basilar cisterns are patent. Ventricles: Prominence of the ventricles suggesting underlying parenchymal volume loss. Vascular: Atherosclerotic calcifications of the carotid siphons and intracranial vertebral arteries. No hyperdense vessel. Skull: No acute or aggressive finding. Unchanged appearance of lucent focus within the left parietal calvarium with similar internal mineralization. Orbits: Orbits  are symmetric. Sinuses: Secretions and layering fluid in the left sphenoid sinus. Mucoperiosteal reaction of right maxillary sinus walls again noted. Other: Mastoid air cells are clear. CT CERVICAL SPINE FINDINGS Alignment: Straightening and slight reversal of the normal cervical lordosis. Trace anterolisthesis of C3 on C4 and C7 on T1. No facet subluxation or dislocation. Skull base and vertebrae: No compression fracture or displaced fracture in the cervical spine. Soft tissues and spinal canal: No prevertebral fluid or swelling. No visible canal hematoma. Disc levels: Disc space narrowing throughout the cervical spine most pronounced at C5-6. Disc osteophyte complexes at multiple levels. There is no high-grade osseous spinal canal stenosis. Facet arthrosis at multiple levels. Upper chest: Negative. Other: None. IMPRESSION: No CT evidence of acute intracranial abnormality. No acute fracture or traumatic malalignment of the cervical spine. Layering fluid in the left sphenoid sinus. Recommend clinical correlation for acute sinusitis. Chronic and  degenerative changes as above. Electronically Signed   By: Denny Flack M.D.   On: 12/29/2023 11:36   CT Cervical Spine Wo Contrast Result Date: 12/29/2023 CLINICAL DATA:  Head trauma, unwitnessed fall out of wheelchair this morning. EXAM: CT HEAD WITHOUT CONTRAST CT CERVICAL SPINE WITHOUT CONTRAST TECHNIQUE: Multidetector CT imaging of the head and cervical spine was performed following the standard protocol without intravenous contrast. Multiplanar CT image reconstructions of the cervical spine were also generated. RADIATION DOSE REDUCTION: This exam was performed according to the departmental dose-optimization program which includes automated exposure control, adjustment of the mA and/or kV according to patient size and/or use of iterative reconstruction technique. COMPARISON:  CT head 01/19/2023. FINDINGS: CT HEAD FINDINGS Brain: No acute intracranial hemorrhage. No CT evidence of acute infarct. Nonspecific hypoattenuation in the periventricular and subcortical white matter favored to reflect chronic microvascular ischemic changes. Moderate generalized parenchymal volume loss. Remote infarct in the left corona radiata extending into the basal ganglia. No edema, mass effect, or midline shift. The basilar cisterns are patent. Ventricles: Prominence of the ventricles suggesting underlying parenchymal volume loss. Vascular: Atherosclerotic calcifications of the carotid siphons and intracranial vertebral arteries. No hyperdense vessel. Skull: No acute or aggressive finding. Unchanged appearance of lucent focus within the left parietal calvarium with similar internal mineralization. Orbits: Orbits are symmetric. Sinuses: Secretions and layering fluid in the left sphenoid sinus. Mucoperiosteal reaction of right maxillary sinus walls again noted. Other: Mastoid air cells are clear. CT CERVICAL SPINE FINDINGS Alignment: Straightening and slight reversal of the normal cervical lordosis. Trace anterolisthesis of C3 on  C4 and C7 on T1. No facet subluxation or dislocation. Skull base and vertebrae: No compression fracture or displaced fracture in the cervical spine. Soft tissues and spinal canal: No prevertebral fluid or swelling. No visible canal hematoma. Disc levels: Disc space narrowing throughout the cervical spine most pronounced at C5-6. Disc osteophyte complexes at multiple levels. There is no high-grade osseous spinal canal stenosis. Facet arthrosis at multiple levels. Upper chest: Negative. Other: None. IMPRESSION: No CT evidence of acute intracranial abnormality. No acute fracture or traumatic malalignment of the cervical spine. Layering fluid in the left sphenoid sinus. Recommend clinical correlation for acute sinusitis. Chronic and degenerative changes as above. Electronically Signed   By: Denny Flack M.D.   On: 12/29/2023 11:36   DG Pelvis Portable Result Date: 12/29/2023 CLINICAL DATA:  Unwitnessed fall from wheelchair this morning. EXAM: PORTABLE PELVIS 1-2 VIEWS COMPARISON:  Right hip radiographs 08/05/2007. FINDINGS: Two views are submitted. The bones are demineralized. The right hip is externally rotated and suboptimally evaluated. No  evidence of acute fracture, dislocation or osteonecrosis. Grossly stable mild degenerative changes at both hips and the lower lumbar spine. IMPRESSION: No evidence of acute pelvic fracture or dislocation. Suboptimal evaluation of the right hip due to positioning. If the patient has hip pain or inability to bear weight, recommend further evaluation with dedicated hip radiographs. Electronically Signed   By: Elmon Hagedorn M.D.   On: 12/29/2023 11:34    Procedures Procedures    Medications Ordered in ED Medications - No data to display  ED Course/ Medical Decision Making/ A&P                                 Medical Decision Making Amount and/or Complexity of Data Reviewed Radiology: ordered.   This patient presents to the ED for concern of fall.  Differential  diagnosis includes syncope, mechanical fall, SAH, TBI, pelvic fracture   Lab Tests:  I Ordered, and personally interpreted labs.  The pertinent results include: CBC unremarkable at 177   Imaging Studies ordered:  I ordered imaging studies including x-ray pelvis, CT head, CT cervical spine I independently visualized and interpreted imaging which showed x-ray and CT imaging are all negative. I agree with the radiologist interpretation   Problem List / ED Course:  Patient presents to the emergency department today with concerns of mechanical fall.  Past history significant for dementia, diabetes, prior strokes who reportedly had a mechanical fall after trying out of bed losing his footing.  Patient did not have a syncopal episode.  Unclear head injury or head strike.  Patient denies any focal areas of pain.  No family initially at bedside to provide further details or history. Physical exam is largely reassuring.  No abnormal pupillary findings.  No visible trauma to the head such as any abrasions, lacerations, or skull deformities.  No focal tenderness to palpation in the upper or lower extremities, abdomen, chest, or pelvis.  No findings to suggest an obvious fracture.  Will proceed with imaging of the pelvis, as well as CT imaging of the head and neck. Imaging is negative.  Patient otherwise stable at this time.  Family at bedside who provides more history and reports that this was indeed a mechanical fall as he reportedly tried to get out of bed and lost his footing.  Patient otherwise stable at this time for outpatient follow-up and discharged home.  Final Clinical Impression(s) / ED Diagnoses Final diagnoses:  Fall, initial encounter    Rx / DC Orders ED Discharge Orders     None         Concetta Dee, PA-C 12/29/23 1246    Afton Horse T, DO 12/31/23 [phone]

## 2023-12-29 NOTE — ED Notes (Signed)
 Called report to [employer] from [facility].

## 2023-12-29 NOTE — Discharge Instructions (Signed)
 You were seen in the ER today for concerns of a fall. Your scans and xrays were thankfully negative today. Please try to limit fall risks at home as best as possible such as loose rugs, uneven floors, and having to use stairs if possible.

## 2023-12-29 NOTE — ED Notes (Addendum)
 PT back from CT. Family at bedside. Ct results and Dispo pending

## 2023-12-31 ENCOUNTER — Encounter (HOSPITAL_COMMUNITY): Payer: Self-pay

## 2023-12-31 ENCOUNTER — Emergency Department (HOSPITAL_COMMUNITY)

## 2023-12-31 ENCOUNTER — Other Ambulatory Visit: Payer: Self-pay

## 2023-12-31 ENCOUNTER — Emergency Department (HOSPITAL_COMMUNITY)
Admission: EM | Admit: 2023-12-31 | Discharge: 2023-12-31 | Disposition: A | Discharge status: Home / Self Care | Attending: Emergency Medicine | Admitting: Emergency Medicine

## 2023-12-31 DIAGNOSIS — F0390 Unspecified dementia without behavioral disturbance: Secondary | ICD-10-CM | POA: Insufficient documentation

## 2023-12-31 DIAGNOSIS — R0981 Nasal congestion: Secondary | ICD-10-CM | POA: Insufficient documentation

## 2023-12-31 DIAGNOSIS — W19XXXA Unspecified fall, initial encounter: Secondary | ICD-10-CM | POA: Diagnosis not present

## 2023-12-31 DIAGNOSIS — R000 Tachycardia, unspecified: Secondary | ICD-10-CM | POA: Insufficient documentation

## 2023-12-31 DIAGNOSIS — Z794 Long term (current) use of insulin: Secondary | ICD-10-CM | POA: Diagnosis not present

## 2023-12-31 DIAGNOSIS — I129 Hypertensive chronic kidney disease with stage 1 through stage 4 chronic kidney disease, or unspecified chronic kidney disease: Secondary | ICD-10-CM | POA: Diagnosis not present

## 2023-12-31 DIAGNOSIS — D631 Anemia in chronic kidney disease: Secondary | ICD-10-CM | POA: Diagnosis not present

## 2023-12-31 DIAGNOSIS — R0689 Other abnormalities of breathing: Secondary | ICD-10-CM | POA: Diagnosis not present

## 2023-12-31 DIAGNOSIS — D72825 Bandemia: Secondary | ICD-10-CM | POA: Diagnosis not present

## 2023-12-31 DIAGNOSIS — N189 Chronic kidney disease, unspecified: Secondary | ICD-10-CM | POA: Insufficient documentation

## 2023-12-31 DIAGNOSIS — Z7982 Long term (current) use of aspirin: Secondary | ICD-10-CM | POA: Insufficient documentation

## 2023-12-31 DIAGNOSIS — I771 Stricture of artery: Secondary | ICD-10-CM | POA: Diagnosis not present

## 2023-12-31 DIAGNOSIS — R0902 Hypoxemia: Secondary | ICD-10-CM | POA: Diagnosis not present

## 2023-12-31 DIAGNOSIS — R404 Transient alteration of awareness: Secondary | ICD-10-CM | POA: Diagnosis not present

## 2023-12-31 DIAGNOSIS — E119 Type 2 diabetes mellitus without complications: Secondary | ICD-10-CM | POA: Insufficient documentation

## 2023-12-31 DIAGNOSIS — I4510 Unspecified right bundle-branch block: Secondary | ICD-10-CM | POA: Diagnosis not present

## 2023-12-31 DIAGNOSIS — R079 Chest pain, unspecified: Secondary | ICD-10-CM | POA: Diagnosis not present

## 2023-12-31 DIAGNOSIS — Y92129 Unspecified place in nursing home as the place of occurrence of the external cause: Secondary | ICD-10-CM | POA: Insufficient documentation

## 2023-12-31 DIAGNOSIS — R509 Fever, unspecified: Secondary | ICD-10-CM | POA: Diagnosis present

## 2023-12-31 LAB — PROTIME-INR
INR: 1.2 (ref 0.8–1.2)
Prothrombin Time: 15.8 s — ABNORMAL HIGH (ref 11.4–15.2)

## 2023-12-31 LAB — URINALYSIS, W/ REFLEX TO CULTURE (INFECTION SUSPECTED)
Bilirubin Urine: NEGATIVE
Glucose, UA: NEGATIVE mg/dL
Hgb urine dipstick: NEGATIVE
Ketones, ur: NEGATIVE mg/dL
Leukocytes,Ua: NEGATIVE
Nitrite: NEGATIVE
Protein, ur: 30 mg/dL — AB
Specific Gravity, Urine: 1.02 (ref 1.005–1.030)
pH: 5 (ref 5.0–8.0)

## 2023-12-31 LAB — COMPREHENSIVE METABOLIC PANEL WITH GFR
ALT: 10 U/L (ref 0–44)
AST: 15 U/L (ref 15–41)
Albumin: 2.9 g/dL — ABNORMAL LOW (ref 3.5–5.0)
Alkaline Phosphatase: 63 U/L (ref 38–126)
Anion gap: 12 (ref 5–15)
BUN: 48 mg/dL — ABNORMAL HIGH (ref 8–23)
CO2: 26 mmol/L (ref 22–32)
Calcium: 11.3 mg/dL — ABNORMAL HIGH (ref 8.9–10.3)
Chloride: 104 mmol/L (ref 98–111)
Creatinine, Ser: 1.87 mg/dL — ABNORMAL HIGH (ref 0.61–1.24)
GFR, Estimated: 33 mL/min — ABNORMAL LOW (ref >60)
Glucose, Bld: 166 mg/dL — ABNORMAL HIGH (ref 70–99)
Potassium: 5 mmol/L (ref 3.5–5.1)
Sodium: 142 mmol/L (ref 135–145)
Total Bilirubin: 1.2 mg/dL (ref 0.0–1.2)
Total Protein: 6.7 g/dL (ref 6.5–8.1)

## 2023-12-31 LAB — CBC
HCT: 38.8 % — ABNORMAL LOW (ref 39.0–52.0)
Hemoglobin: 11.8 g/dL — ABNORMAL LOW (ref 13.0–17.0)
MCH: 32.6 pg (ref 26.0–34.0)
MCHC: 30.4 g/dL (ref 30.0–36.0)
MCV: 107.2 fL — ABNORMAL HIGH (ref 80.0–100.0)
Platelets: 286 10*3/uL (ref 150–400)
RBC: 3.62 MIL/uL — ABNORMAL LOW (ref 4.22–5.81)
RDW: 13.8 % (ref 11.5–15.5)
WBC: 14.8 10*3/uL — ABNORMAL HIGH (ref 4.0–10.5)
nRBC: 0 % (ref 0.0–0.2)

## 2023-12-31 LAB — CBG MONITORING, ED: Glucose-Capillary: 190 mg/dL — ABNORMAL HIGH (ref 70–99)

## 2023-12-31 LAB — I-STAT CG4 LACTIC ACID, ED
Lactic Acid, Venous: 2.4 mmol/L (ref 0.5–1.9)
Lactic Acid, Venous: 2 mmol/L (ref 0.5–1.9)

## 2023-12-31 MED ORDER — HALOPERIDOL LACTATE 5 MG/ML IJ SOLN
2.0000 mg | Freq: Once | INTRAMUSCULAR | Status: AC
Start: 2023-12-31 — End: 2023-12-31
  Administered 2023-12-31: 2 mg via INTRAVENOUS
  Filled 2023-12-31: qty 1

## 2023-12-31 MED ORDER — ACETAMINOPHEN 650 MG RE SUPP
RECTAL | Status: AC
Start: 2023-12-31 — End: 2023-12-31
  Filled 2023-12-31: qty 1

## 2023-12-31 MED ORDER — OLANZAPINE 5 MG PO TBDP: 5.0000 mg | ORAL_TABLET | Freq: Every day | ORAL | 0 refills | Status: AC

## 2023-12-31 MED ORDER — SODIUM CHLORIDE 0.9 % IV SOLN
1.0000 g | Freq: Once | INTRAVENOUS | Status: AC
  Administered 2023-12-31: 1 g via INTRAVENOUS
  Filled 2023-12-31: qty 10

## 2023-12-31 MED ORDER — AMOXICILLIN-POT CLAVULANATE 400-57 MG/5ML PO SUSR
875.0000 mg | Freq: Two times a day (BID) | ORAL | 0 refills | Status: AC
Start: 2023-12-31 — End: 2024-01-07

## 2023-12-31 MED ORDER — SODIUM CHLORIDE 0.9 % IV BOLUS
500.0000 mL | Freq: Once | INTRAVENOUS | Status: AC
  Administered 2023-12-31: 500 mL via INTRAVENOUS

## 2023-12-31 NOTE — ED Triage Notes (Signed)
 Coming from [place], pt hx of dementia, recently seen from a fall. Was found on the floor, unwitnessed fall, more alter than usual.   Baseline responds to name, wears 3L [place].  Per EMS O2 in the low 80's, pt placed in Non-rebreather mask.  On assessment pt Aox0. Withdrawn to pain. RR21.

## 2023-12-31 NOTE — ED Provider Notes (Addendum)
 [place] EMERGENCY DEPARTMENT AT [facility] Provider Note  CSN: [id number] Arrival date & time: 12/31/23 0135  Chief Complaint(s) fall/ AMS Coming from [place], pt hx of dementia, recently seen from a fall. Was found on the floor, unwitnessed fall, more alter than usual.   Baseline responds to name, wears 3L [place].  Per EMS O2 in the low 80's, pt placed in Non-rebreather mask.  On assessment pt Aox0. Withdrawn to pain. RR21.    HPI [name] [name] is a 89 y.o. male with a past medical history listed below including dementia who presents to the emergency department after an unwitnessed fall at the skilled nursing facility.  The patient has had frequent falls from bed.  Recently seen 2 days ago for the same, daughter reports that the patient went home on supplemental oxygen.  Yesterday, he was evaluated and placed on hospice for additional resources.  Daughter confirms patient is DNR/DNI but requests treatment if there is a source of infection.  Daughter also reports that the patient has had decreased oral intake and refuses to eat.  Patient also has been refusing medication.  Daughter also reports that the patient has been dealing with nasal congestion and has been previously diagnosed with sinusitis.  Patient was noted to be hypoxic on his 2 L nasal cannula.  Patient also had low-grade fever at 100.9.  The history is provided by a relative.    Past Medical History Past Medical History:  Diagnosis Date   Acute CVA (cerebrovascular accident) (HCC) 04/05/2017   Acute metabolic encephalopathy 04/04/2017   Anxiety    CAP (community acquired pneumonia) 04/04/2017   Change in bowel habits 11/06/2016   Dementia (HCC)    Diabetes mellitus    Diabetes mellitus (HCC) 04/04/2017   Essential tremor 08/20/2015   Hyperlipidemia    Late onset Alzheimer's disease without behavioral disturbance (HCC) 10/13/2018   MCI (mild cognitive impairment) with memory loss 08/20/2015   Memory  loss 02/13/2015   Obesity    OBSTRUCTIVE SLEEP APNEA 07/19/2008   Qualifier: Diagnosis of  By: Maryjane Snider LPN, [name]     Osteoarthritis of knee 10/01/2017   PNA (pneumonia)    Sleep apnea    uses CPAP at night to sleep   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   Stroke (cerebrum) (HCC)    Stroke (HCC)    Tremor, essential 10/13/2018   Umbilical hernia without obstruction and without gangrene 11/06/2016   Vision loss, central, left 10/13/2018   Patient Active Problem List   Diagnosis Date Noted   Dyspnea on exertion 09/04/2022   AMS (altered mental status) 08/15/2022   Anemia in chronic kidney disease 11/28/2020   Chronic kidney disease due to hypertension 11/28/2020   Chronic sinusitis 11/28/2020   Chronic venous insufficiency 11/28/2020   Essential hypertension 11/28/2020   Gastroesophageal reflux disease 11/28/2020   Hypertrophy of prostate without urinary obstruction and other lower urinary tract symptoms (LUTS) 11/28/2020   Hypoglycemia due to type 2 diabetes mellitus (HCC) 11/28/2020   Insomnia 11/28/2020   Ischemic optic neuropathy 11/28/2020   Late effects of cerebrovascular disease 11/28/2020   Sequela, post-stroke 11/28/2020   Long term (current) use of insulin (HCC) 11/28/2020   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   Other specified cardiac dysrhythmias 11/28/2020   Proteinuria 11/28/2020   Other psoriasis 11/28/2020   Psoriasis 11/28/2020   Vision loss, central, left 10/13/2018   Tremor, essential 10/13/2018   Late onset Alzheimer's disease without behavioral disturbance (HCC) 10/13/2018   Osteoarthritis of  knee 10/01/2017   Stroke (cerebrum) (HCC)    Acute CVA (cerebrovascular accident) (HCC) 04/05/2017   CAP (community acquired pneumonia) 04/04/2017   Acute metabolic encephalopathy 04/04/2017   Dementia (HCC) 04/04/2017   Diabetes mellitus (HCC) 04/04/2017   Hyperlipidemia 04/04/2017   Change in bowel habits 11/06/2016   Umbilical hernia without obstruction and  without gangrene 11/06/2016   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 Authorizing Provider  amoxicillin-clavulanate (AUGMENTIN) 400-57 MG/5ML suspension Take 10.9 mLs (875 mg total) by mouth 2 (two) times daily for 7 days. 12/31/23 01/07/24 Yes Fordyce Lepak, Amadeo Garnet, MD  OLANZapine zydis (ZYPREXA) 5 MG disintegrating tablet Take 1 tablet (5 mg total) by mouth at bedtime for 7 days. 12/31/23 01/07/24 Yes Jakyrah Holladay, Amadeo Garnet, MD  acetaminophen (TYLENOL) 325 MG tablet Take 650 mg by mouth every 6 (six) hours as needed. 12/28/23   [provider]  albuterol (VENTOLIN HFA) 108 (90 Base) MCG/ACT inhaler Inhale 2 puffs into the lungs every 6 (six) hours as needed. 12/02/23   [provider]  Apoaequorin (PREVAGEN) 10 MG CAPS Take 10 mg by mouth daily.    [provider]  aspirin EC 325 MG EC tablet Take 1 tablet (325 mg total) by mouth daily. 04/06/17   Rai, Delene Ruffini, MD  B-D ULTRAFINE III SHORT PEN 31G X 8 MM MISC  12/28/14   [provider]  BAZA ANTIFUNGAL 2 % cream Apply 1 Application topically 2 (two) times daily. 12/11/23   [provider]  BAZA PROTECT MOISTURE BARRIER 12 % CREA Apply 1 Application topically 2 (two) times daily. 12/14/23   [provider]  Cholecalciferol (VITAMIN D-3 PO) Take 1,000 Units by mouth daily.     [provider]  citalopram (CELEXA) 20 MG tablet TAKE 1 TABLET (20 MG TOTAL) BY MOUTH DAILY. 08/04/16   Dohmeier, Porfirio Mylar, MD  clobetasol cream (TEMOVATE) 0.05 % Apply 1 Application topically 2 (two) times daily. 11/20/23   [provider]  cyanocobalamin (VITAMIN B12) 500 MCG tablet Take 500 mcg by mouth daily.    [provider]  FLORASTOR 250 MG capsule Take 500  mg by mouth 2 (two) times daily. 12/14/23   [provider]  guaifenesin (HUMIBID E) 400 MG TABS tablet Take 400 mg by mouth 3 (three) times daily. 12/02/23   [provider]  insulin glargine (LANTUS) 100 UNIT/ML injection Inject 4 Units into the skin daily after supper.    [provider]  LANSOPRAZOLE PO Take 10 mg by mouth every 12 (twelve) hours as needed (Acid reflux).    [provider]  loratadine-pseudoephedrine (CLARITIN-D 24-HOUR) 10-240 MG per 24 hr tablet Take 1 tablet by mouth daily as needed for allergies.    [provider]  LORazepam (ATIVAN) 0.5 MG tablet Take by mouth. 12/28/23   [provider]  mirtazapine (REMERON) 7.5 MG tablet Take 7.5 mg by mouth at bedtime. 12/21/23   [provider]  Multiple Vitamin (MULTIVITAMIN WITH MINERALS) TABS Take 1 tablet by mouth daily.    [provider]  tamsulosin (FLOMAX) 0.4 MG CAPS capsule Take 0.4 mg by mouth daily.    [provider]  Allergies Donepezil, Lisinopril, Methocarbamol, Namenda [memantine hcl], and Other  Review of Systems Review of Systems As noted in HPI  Physical Exam Vital Signs  I have reviewed the triage vital signs BP 126/78 (BP Location: Right Arm)   Pulse (!) 101   Temp (!) 100.9 F (38.3 C) (Rectal)   Resp (!) 30   SpO2 (!) 86%   Physical Exam Vitals reviewed.  Constitutional:      General: He is not in acute distress.    Appearance: He is well-developed. He is not diaphoretic.     Comments: Curled in fetal position  HENT:     Head: Normocephalic and atraumatic.     Right Ear: External ear normal.     Left Ear: External ear normal.     Nose: Nose normal.  Eyes:     General: No scleral icterus.       Right eye: No discharge.        Left eye: No discharge.     Conjunctiva/sclera: Conjunctivae  normal.     Pupils: Pupils are equal, round, and reactive to light.  Cardiovascular:     Rate and Rhythm: Regular rhythm. Tachycardia present.     Pulses:          Radial pulses are 2+ on the right side and 2+ on the left side.       Dorsalis pedis pulses are 2+ on the right side and 2+ on the left side.     Heart sounds: Normal heart sounds. No murmur heard.    No friction rub. No gallop.  Pulmonary:     Effort: Pulmonary effort is normal. No respiratory distress.     Breath sounds: Normal breath sounds. No stridor. No rales.  Abdominal:     General: There is no distension.     Palpations: Abdomen is soft.     Tenderness: There is no abdominal tenderness.  Musculoskeletal:        General: No tenderness.     Cervical back: Normal range of motion and neck supple. No bony tenderness.     Thoracic back: No bony tenderness.     Lumbar back: No bony tenderness.     Comments: Clavicle stable. Chest stable to AP/Lat compression. Pelvis stable to Lat compression. No obvious extremity deformity. No chest or abdominal wall contusion.  Skin:    General: Skin is warm and dry.     Findings: No erythema or rash.  Neurological:     Mental Status: Mental status is at baseline.     Comments:       ED Results and Treatments Labs (all labs ordered are listed, but only abnormal results are displayed) Labs Reviewed  COMPREHENSIVE METABOLIC PANEL WITH GFR - Abnormal; Notable for the following components:      Result Value   Glucose, Bld 166 (*)    BUN 48 (*)    Creatinine, Ser 1.87 (*)    Calcium 11.3 (*)    Albumin 2.9 (*)    GFR, Estimated 33 (*)    All other components within normal limits  CBC - Abnormal; Notable for the following components:   WBC 14.8 (*)    RBC 3.62 (*)    Hemoglobin 11.8 (*)    HCT 38.8 (*)    MCV 107.2 (*)    All other components within normal limits  PROTIME-INR - Abnormal; Notable for the following components:   Prothrombin Time 15.8 (*)    All other  components within  normal limits  URINALYSIS, W/ REFLEX TO CULTURE (INFECTION SUSPECTED) - Abnormal; Notable for the following components:   APPearance HAZY (*)    Protein, ur 30 (*)    Bacteria, UA RARE (*)    All other components within normal limits  CBG MONITORING, ED - Abnormal; Notable for the following components:   Glucose-Capillary 190 (*)    All other components within normal limits  I-STAT CG4 LACTIC ACID, ED - Abnormal; Notable for the following components:   Lactic Acid, Venous 2.4 (*)    All other components within normal limits  I-STAT CG4 LACTIC ACID, ED - Abnormal; Notable for the following components:   Lactic Acid, Venous 2.0 (*)    All other components within normal limits                                                                                                                         EKG  EKG Interpretation Date/Time:  Thursday December 31 2023 02:05:16 EDT Ventricular Rate:  98 PR Interval:  176 QRS Duration:  138 QT Interval:  362 QTC Calculation: 463 R Axis:   201  Text Interpretation: Sinus rhythm Atrial premature complex Right bundle branch block Minimal ST elevation, inferior leads Confirmed by Townsend Freud [phone]) on 12/31/2023 5:11:38 AM       Radiology DG Chest Port 1 View Result Date: 12/31/2023 CLINICAL DATA:  Recent fall with chest pain, initial encounter EXAM: PORTABLE CHEST 1 VIEW COMPARISON:  11/25/2023 FINDINGS: Cardiac shadow is within normal limits. Tortuous thoracic aorta is again seen. Lungs are well aerated bilaterally. No focal infiltrate or effusion is noted. No bony abnormality is noted. IMPRESSION: No acute abnormality noted. Electronically Signed   By: Violeta Grey M.D.   On: 12/31/2023 02:53    Medications Ordered in ED Medications  acetaminophen (TYLENOL) 650 MG suppository (  Given 12/31/23 0219)  cefTRIAXone (ROCEPHIN) 1 g in sodium chloride 0.9 % 100 mL IVPB (0 g Intravenous Stopped 12/31/23 0457)  sodium chloride 0.9 %  bolus 500 mL (500 mLs Intravenous New Bag/Given 12/31/23 0458)  haloperidol lactate (HALDOL) injection 2 mg (2 mg Intravenous Given 12/31/23 0458)   Procedures .Critical Care  Performed by: Lindle Rhea, MD Authorized by: Lindle Rhea, MD   Critical care provider statement:    Critical care time (minutes):  46   Critical care was necessary to treat or prevent imminent or life-threatening deterioration of the following conditions:  Respiratory failure   Critical care was time spent personally by me on the following activities:  Development of treatment plan with patient or surrogate, discussions with consultants, evaluation of patient's response to treatment, examination of patient, ordering and review of laboratory studies, ordering and review of radiographic studies, ordering and performing treatments and interventions, pulse oximetry, re-evaluation of patient's condition and review of old charts   (including critical care time) Medical Decision Making / ED Course   Medical Decision Making Amount and/or Complexity of Data Reviewed  Labs: ordered. Decision-making details documented in ED Course. Radiology: ordered and independent interpretation performed. Decision-making details documented in ED Course. ECG/medicine tests: ordered and independent interpretation performed. Decision-making details documented in ED Course.  Risk Prescription drug management.    Unwitnessed fall.  Patient noted to be hypoxic and febrile.  Per POA's request, infectious workup initiated.  With shared decision making, POA and I felt that advanced imaging of the head and cervical spine would not be beneficial at this time.  CBC does show a leukocytosis and mild anemia.  CMP without significant electrolyte derangements.  Patient does have mild AKI. Chest x-ray without evidence of pneumonia, pneumothorax, pulmonary edema pleural effusion. Lactic acid mildly elevated at 2.4.  Possible sepsis  versus hypoxia.  No source of infection currently at this time.  However given history of sinusitis, Rocephin was given. UA negative for infection.  During workup, patient became agitated and required low-dose Haldol which patient responded well to.  Lactic acid cleared without IV fluids.  Given the mild AKI, patient was given IV fluids. Will prescribe patient liquid oral antibiotics for possible sinusitis. Will also prescribe short course of ODT Zyprexa for nighttime agitation.  Patient oxygen titrated back down to 2 L nasal cannula.  Maintain sats >96%.  Daughter is comfortable with him being discharged back to sNF.     Final Clinical Impression(s) / ED Diagnoses Final diagnoses:  Fall at nursing home, initial encounter  Hypoxia  Bandemia   The patient appears reasonably screened and/or stabilized for discharge and I doubt any other medical condition or other [facility] requiring further screening, evaluation, or treatment in the ED at this time. I have discussed the findings, Dx and Tx plan with the patient/family who expressed understanding and agree(s) with the plan. Discharge instructions discussed at length. The patient/family was given strict return precautions who verbalized understanding of the instructions. No further questions at time of discharge.  Disposition: Discharge  Condition: Good  ED Discharge Orders          Ordered    amoxicillin-clavulanate (AUGMENTIN) 400-57 MG/5ML suspension  2 times daily        12/31/23 0556    OLANZapine zydis (ZYPREXA) 5 MG disintegrating tablet  Daily at bedtime        12/31/23 1610              Follow Up: Rae Bugler, MD [address] [place] [address] [phone]  Call  to schedule an appointment for close follow up    This chart was dictated using voice recognition software.  Despite best efforts to proofread,  errors can occur which can change the documentation meaning.      Lindle Rhea, MD 12/31/23 [phone]

## 2023-12-31 NOTE — ED Notes (Addendum)
 ED Provider at bedside with Daughter

## 2023-12-31 NOTE — ED Notes (Signed)
 I-stat lactic acid is 2.37 EDP [place] notified

## 2023-12-31 NOTE — ED Notes (Signed)
 Called [facility] to setup transportation

## 2023-12-31 NOTE — ED Notes (Signed)
 Pt repositioned in bed. No urine noted in condom cath at this time.

## 2023-12-31 NOTE — ED Notes (Signed)
 Called [facility] and communicated patient is discharge and coming back

## 2024-01-14 DEATH — deceased
